# Patient Record
Sex: Male | Born: 1935 | Race: White | Hispanic: No | Smoking: Never smoker
Health system: Southern US, Community
[De-identification: ages and names within clinical notes are randomized; demographics above are authoritative.]

## PROBLEM LIST (undated history)

## (undated) DIAGNOSIS — I4891 Unspecified atrial fibrillation: Secondary | ICD-10-CM

## (undated) DIAGNOSIS — H409 Unspecified glaucoma: Secondary | ICD-10-CM

## (undated) DIAGNOSIS — E119 Type 2 diabetes mellitus without complications: Secondary | ICD-10-CM

## (undated) DIAGNOSIS — Z9181 History of falling: Secondary | ICD-10-CM

## (undated) DIAGNOSIS — H269 Unspecified cataract: Secondary | ICD-10-CM

## (undated) DIAGNOSIS — G629 Polyneuropathy, unspecified: Secondary | ICD-10-CM

## (undated) DIAGNOSIS — E785 Hyperlipidemia, unspecified: Secondary | ICD-10-CM

## (undated) DIAGNOSIS — E1142 Type 2 diabetes mellitus with diabetic polyneuropathy: Secondary | ICD-10-CM

## (undated) DIAGNOSIS — C61 Malignant neoplasm of prostate: Secondary | ICD-10-CM

## (undated) DIAGNOSIS — Z923 Personal history of irradiation: Secondary | ICD-10-CM

## (undated) HISTORY — DX: Type 2 diabetes mellitus with diabetic polyneuropathy: E11.42

## (undated) HISTORY — DX: Personal history of irradiation: Z92.3

## (undated) HISTORY — DX: Hyperlipidemia, unspecified: E78.5

## (undated) HISTORY — DX: History of falling: Z91.81

## (undated) HISTORY — DX: Malignant neoplasm of prostate: C61

---

## 2001-02-17 DIAGNOSIS — I4891 Unspecified atrial fibrillation: Secondary | ICD-10-CM

## 2001-02-17 HISTORY — DX: Unspecified atrial fibrillation: I48.91

## 2006-07-14 ENCOUNTER — Ambulatory Visit: Payer: Self-pay | Admitting: *Deleted

## 2011-02-21 DIAGNOSIS — I4891 Unspecified atrial fibrillation: Secondary | ICD-10-CM | POA: Diagnosis not present

## 2011-02-21 DIAGNOSIS — Z7901 Long term (current) use of anticoagulants: Secondary | ICD-10-CM | POA: Diagnosis not present

## 2011-03-11 DIAGNOSIS — E1049 Type 1 diabetes mellitus with other diabetic neurological complication: Secondary | ICD-10-CM | POA: Diagnosis not present

## 2011-03-24 DIAGNOSIS — Z7901 Long term (current) use of anticoagulants: Secondary | ICD-10-CM | POA: Diagnosis not present

## 2011-03-24 DIAGNOSIS — I4891 Unspecified atrial fibrillation: Secondary | ICD-10-CM | POA: Diagnosis not present

## 2011-04-21 DIAGNOSIS — I4891 Unspecified atrial fibrillation: Secondary | ICD-10-CM | POA: Diagnosis not present

## 2011-04-21 DIAGNOSIS — Z7901 Long term (current) use of anticoagulants: Secondary | ICD-10-CM | POA: Diagnosis not present

## 2011-04-23 DIAGNOSIS — H409 Unspecified glaucoma: Secondary | ICD-10-CM | POA: Diagnosis not present

## 2011-04-23 DIAGNOSIS — H2510 Age-related nuclear cataract, unspecified eye: Secondary | ICD-10-CM | POA: Diagnosis not present

## 2011-04-23 DIAGNOSIS — H4011X0 Primary open-angle glaucoma, stage unspecified: Secondary | ICD-10-CM | POA: Diagnosis not present

## 2011-05-13 DIAGNOSIS — Z125 Encounter for screening for malignant neoplasm of prostate: Secondary | ICD-10-CM | POA: Diagnosis not present

## 2011-05-13 DIAGNOSIS — E1129 Type 2 diabetes mellitus with other diabetic kidney complication: Secondary | ICD-10-CM | POA: Diagnosis not present

## 2011-05-13 DIAGNOSIS — E785 Hyperlipidemia, unspecified: Secondary | ICD-10-CM | POA: Diagnosis not present

## 2011-05-13 DIAGNOSIS — I10 Essential (primary) hypertension: Secondary | ICD-10-CM | POA: Diagnosis not present

## 2011-05-14 DIAGNOSIS — E785 Hyperlipidemia, unspecified: Secondary | ICD-10-CM | POA: Diagnosis not present

## 2011-05-20 DIAGNOSIS — E1165 Type 2 diabetes mellitus with hyperglycemia: Secondary | ICD-10-CM | POA: Diagnosis not present

## 2011-05-20 DIAGNOSIS — E1129 Type 2 diabetes mellitus with other diabetic kidney complication: Secondary | ICD-10-CM | POA: Diagnosis not present

## 2011-05-20 DIAGNOSIS — Z125 Encounter for screening for malignant neoplasm of prostate: Secondary | ICD-10-CM | POA: Diagnosis not present

## 2011-05-20 DIAGNOSIS — I10 Essential (primary) hypertension: Secondary | ICD-10-CM | POA: Diagnosis not present

## 2011-05-20 DIAGNOSIS — E785 Hyperlipidemia, unspecified: Secondary | ICD-10-CM | POA: Diagnosis not present

## 2011-05-20 DIAGNOSIS — Z7901 Long term (current) use of anticoagulants: Secondary | ICD-10-CM | POA: Diagnosis not present

## 2011-05-20 DIAGNOSIS — Z0000 Encounter for general adult medical examination without abnormal findings: Secondary | ICD-10-CM | POA: Diagnosis not present

## 2011-05-22 DIAGNOSIS — I739 Peripheral vascular disease, unspecified: Secondary | ICD-10-CM | POA: Diagnosis not present

## 2011-05-22 DIAGNOSIS — E1059 Type 1 diabetes mellitus with other circulatory complications: Secondary | ICD-10-CM | POA: Diagnosis not present

## 2011-05-22 DIAGNOSIS — Z1212 Encounter for screening for malignant neoplasm of rectum: Secondary | ICD-10-CM | POA: Diagnosis not present

## 2011-05-22 DIAGNOSIS — L608 Other nail disorders: Secondary | ICD-10-CM | POA: Diagnosis not present

## 2011-05-27 DIAGNOSIS — Z7901 Long term (current) use of anticoagulants: Secondary | ICD-10-CM | POA: Diagnosis not present

## 2011-05-27 DIAGNOSIS — I4891 Unspecified atrial fibrillation: Secondary | ICD-10-CM | POA: Diagnosis not present

## 2011-06-10 DIAGNOSIS — Z7901 Long term (current) use of anticoagulants: Secondary | ICD-10-CM | POA: Diagnosis not present

## 2011-06-10 DIAGNOSIS — I4891 Unspecified atrial fibrillation: Secondary | ICD-10-CM | POA: Diagnosis not present

## 2011-06-17 DIAGNOSIS — Z7901 Long term (current) use of anticoagulants: Secondary | ICD-10-CM | POA: Diagnosis not present

## 2011-06-17 DIAGNOSIS — I4891 Unspecified atrial fibrillation: Secondary | ICD-10-CM | POA: Diagnosis not present

## 2011-07-22 DIAGNOSIS — N529 Male erectile dysfunction, unspecified: Secondary | ICD-10-CM | POA: Diagnosis not present

## 2011-07-22 DIAGNOSIS — Z125 Encounter for screening for malignant neoplasm of prostate: Secondary | ICD-10-CM | POA: Diagnosis not present

## 2011-07-22 DIAGNOSIS — N400 Enlarged prostate without lower urinary tract symptoms: Secondary | ICD-10-CM | POA: Diagnosis not present

## 2011-07-24 DIAGNOSIS — Z7901 Long term (current) use of anticoagulants: Secondary | ICD-10-CM | POA: Diagnosis not present

## 2011-07-24 DIAGNOSIS — I4891 Unspecified atrial fibrillation: Secondary | ICD-10-CM | POA: Diagnosis not present

## 2011-07-31 DIAGNOSIS — L84 Corns and callosities: Secondary | ICD-10-CM | POA: Diagnosis not present

## 2011-07-31 DIAGNOSIS — I739 Peripheral vascular disease, unspecified: Secondary | ICD-10-CM | POA: Diagnosis not present

## 2011-07-31 DIAGNOSIS — L608 Other nail disorders: Secondary | ICD-10-CM | POA: Diagnosis not present

## 2011-07-31 DIAGNOSIS — E1059 Type 1 diabetes mellitus with other circulatory complications: Secondary | ICD-10-CM | POA: Diagnosis not present

## 2011-08-25 DIAGNOSIS — R972 Elevated prostate specific antigen [PSA]: Secondary | ICD-10-CM | POA: Diagnosis not present

## 2011-08-27 DIAGNOSIS — I4891 Unspecified atrial fibrillation: Secondary | ICD-10-CM | POA: Diagnosis not present

## 2011-08-27 DIAGNOSIS — Z7901 Long term (current) use of anticoagulants: Secondary | ICD-10-CM | POA: Diagnosis not present

## 2011-09-25 DIAGNOSIS — I4891 Unspecified atrial fibrillation: Secondary | ICD-10-CM | POA: Diagnosis not present

## 2011-09-25 DIAGNOSIS — Z7901 Long term (current) use of anticoagulants: Secondary | ICD-10-CM | POA: Diagnosis not present

## 2011-10-09 DIAGNOSIS — I4891 Unspecified atrial fibrillation: Secondary | ICD-10-CM | POA: Diagnosis not present

## 2011-10-09 DIAGNOSIS — I739 Peripheral vascular disease, unspecified: Secondary | ICD-10-CM | POA: Diagnosis not present

## 2011-10-09 DIAGNOSIS — L608 Other nail disorders: Secondary | ICD-10-CM | POA: Diagnosis not present

## 2011-10-09 DIAGNOSIS — E1059 Type 1 diabetes mellitus with other circulatory complications: Secondary | ICD-10-CM | POA: Diagnosis not present

## 2011-10-09 DIAGNOSIS — Z7901 Long term (current) use of anticoagulants: Secondary | ICD-10-CM | POA: Diagnosis not present

## 2011-11-05 DIAGNOSIS — H409 Unspecified glaucoma: Secondary | ICD-10-CM | POA: Diagnosis not present

## 2011-11-05 DIAGNOSIS — H4011X0 Primary open-angle glaucoma, stage unspecified: Secondary | ICD-10-CM | POA: Diagnosis not present

## 2011-11-05 DIAGNOSIS — H2510 Age-related nuclear cataract, unspecified eye: Secondary | ICD-10-CM | POA: Diagnosis not present

## 2011-11-17 DIAGNOSIS — R972 Elevated prostate specific antigen [PSA]: Secondary | ICD-10-CM | POA: Diagnosis not present

## 2011-11-24 DIAGNOSIS — R972 Elevated prostate specific antigen [PSA]: Secondary | ICD-10-CM | POA: Diagnosis not present

## 2011-11-25 DIAGNOSIS — E11329 Type 2 diabetes mellitus with mild nonproliferative diabetic retinopathy without macular edema: Secondary | ICD-10-CM | POA: Diagnosis not present

## 2011-11-25 DIAGNOSIS — H43819 Vitreous degeneration, unspecified eye: Secondary | ICD-10-CM | POA: Diagnosis not present

## 2011-11-25 DIAGNOSIS — E1039 Type 1 diabetes mellitus with other diabetic ophthalmic complication: Secondary | ICD-10-CM | POA: Diagnosis not present

## 2011-11-25 DIAGNOSIS — H4011X0 Primary open-angle glaucoma, stage unspecified: Secondary | ICD-10-CM | POA: Diagnosis not present

## 2011-11-28 DIAGNOSIS — Z7901 Long term (current) use of anticoagulants: Secondary | ICD-10-CM | POA: Diagnosis not present

## 2011-11-28 DIAGNOSIS — R972 Elevated prostate specific antigen [PSA]: Secondary | ICD-10-CM | POA: Diagnosis not present

## 2011-11-28 DIAGNOSIS — Z23 Encounter for immunization: Secondary | ICD-10-CM | POA: Diagnosis not present

## 2011-11-28 DIAGNOSIS — I4891 Unspecified atrial fibrillation: Secondary | ICD-10-CM | POA: Diagnosis not present

## 2011-12-25 DIAGNOSIS — R972 Elevated prostate specific antigen [PSA]: Secondary | ICD-10-CM | POA: Diagnosis not present

## 2011-12-25 HISTORY — PX: PROSTATE BIOPSY: SHX241

## 2011-12-30 DIAGNOSIS — Z7901 Long term (current) use of anticoagulants: Secondary | ICD-10-CM | POA: Diagnosis not present

## 2011-12-30 DIAGNOSIS — I4891 Unspecified atrial fibrillation: Secondary | ICD-10-CM | POA: Diagnosis not present

## 2012-01-01 DIAGNOSIS — E1059 Type 1 diabetes mellitus with other circulatory complications: Secondary | ICD-10-CM | POA: Diagnosis not present

## 2012-01-01 DIAGNOSIS — L608 Other nail disorders: Secondary | ICD-10-CM | POA: Diagnosis not present

## 2012-01-01 DIAGNOSIS — I739 Peripheral vascular disease, unspecified: Secondary | ICD-10-CM | POA: Diagnosis not present

## 2012-01-12 DIAGNOSIS — Z7901 Long term (current) use of anticoagulants: Secondary | ICD-10-CM | POA: Diagnosis not present

## 2012-01-12 DIAGNOSIS — I4891 Unspecified atrial fibrillation: Secondary | ICD-10-CM | POA: Diagnosis not present

## 2012-01-19 DIAGNOSIS — C61 Malignant neoplasm of prostate: Secondary | ICD-10-CM | POA: Diagnosis not present

## 2012-01-28 NOTE — Progress Notes (Signed)
New Consult Prostate Cancer 12/25/2011 Biopsy=Adenocarcinoma,Gleason=3+3=6,PSA=5.57,Volume=45cc PSA 11/17/11=5.57 08/01/11=5.69 07/2010=4.65 02/2010=5.5 01/2008=3.1  Widowed,Retired,     Allergies:NKDA

## 2012-01-29 ENCOUNTER — Ambulatory Visit
Admission: RE | Admit: 2012-01-29 | Discharge: 2012-01-29 | Disposition: A | Discharge status: Home / Self Care | Payer: Medicare Other | Source: Ambulatory Visit | Attending: Radiation Oncology | Admitting: Radiation Oncology

## 2012-01-29 ENCOUNTER — Encounter: Payer: Self-pay | Admitting: Radiation Oncology

## 2012-01-29 VITALS — BP 172/91 | HR 112 | Temp 98.0°F | Resp 20 | Ht 73.0 in | Wt 251.1 lb

## 2012-01-29 DIAGNOSIS — C61 Malignant neoplasm of prostate: Secondary | ICD-10-CM

## 2012-01-29 DIAGNOSIS — Z79899 Other long term (current) drug therapy: Secondary | ICD-10-CM | POA: Insufficient documentation

## 2012-01-29 DIAGNOSIS — Z7901 Long term (current) use of anticoagulants: Secondary | ICD-10-CM | POA: Diagnosis not present

## 2012-01-29 DIAGNOSIS — E1149 Type 2 diabetes mellitus with other diabetic neurological complication: Secondary | ICD-10-CM | POA: Diagnosis not present

## 2012-01-29 DIAGNOSIS — Z794 Long term (current) use of insulin: Secondary | ICD-10-CM | POA: Insufficient documentation

## 2012-01-29 DIAGNOSIS — N529 Male erectile dysfunction, unspecified: Secondary | ICD-10-CM | POA: Insufficient documentation

## 2012-01-29 DIAGNOSIS — E1142 Type 2 diabetes mellitus with diabetic polyneuropathy: Secondary | ICD-10-CM | POA: Insufficient documentation

## 2012-01-29 HISTORY — DX: Unspecified cataract: H26.9

## 2012-01-29 HISTORY — DX: Unspecified glaucoma: H40.9

## 2012-01-29 HISTORY — DX: Type 2 diabetes mellitus without complications: E11.9

## 2012-01-29 NOTE — Progress Notes (Signed)
[facility] Radiation Oncology NEW PATIENT EVALUATION  Name: Charles Friedman MRN: [id number]  Date:   01/29/2012           DOB: 06-06-1932  Status: outpatient   CC:   Dr. Arther Dames, * Dr. Guerry Bruin   REFERRING PHYSICIAN: Arther Dames, *MD   DIAGNOSIS:  Stage TI C. favorable risk adenocarcinoma prostate  HISTORY OF PRESENT ILLNESS:  Charles Friedman is a 78 y.o. male who is seen today for the courtesy of Dr. Margarita Grizzle for evaluation of his stage TI C. favorable risk adenocarcinoma prostate. He was noted to have a slow rise in his PSA from 3.1 in December 2009 up to 5.5 by January 2012, more recently 5.6 on 11/17/2011 . He underwent ultrasound-guided biopsies on 12/25/2011 by Dr. Margarita Grizzle. His then had Gleason 6 (3+3) involving 50% of one core from right lateral mid gland, 50% of one core from the right mid gland, 10% of one core from the right apex and 70% of one core from the left mid gland. 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 His gland volume was measured to be 45 cc. He is doing well from a GU and GI standpoint. His I PSS score is 9. He does have a peripheral Jaqualyn Juday E. from diabetes. He has erectile dysfunction.  PREVIOUS RADIATION THERAPY: No   PAST MEDICAL HISTORY:  has a past medical history of Cataract; Diabetes mellitus without complication; and Glaucoma.     PAST SURGICAL HISTORY:  Past Surgical History  Procedure Date  . Prostate biopsy 12/25/11    Adenocarcinoam     FAMILY HISTORY: family history includes Cancer (age of onset:43) in his sister. His father died of cardiac disease at 38 and his mother died of cardiac disease and 27.   SOCIAL HISTORY:  reports that he has never smoked. He does not have any smokeless tobacco history on file. He reports that he does not drink alcohol or use illicit drugs. 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 Widowed for 4 years, 4  children.   ALLERGIES: Review of patient's allergies indicates no known allergies.   MEDICATIONS:  Current Outpatient Prescriptions  Medication Sig Dispense Refill  . diltiazem (TIAZAC) 300 MG 24 hr capsule Take 300 mg by mouth daily.      . fish oil-omega-3 fatty acids 1000 MG capsule Take 2 g by mouth daily.      . fluvastatin XL (LESCOL XL) 80 MG 24 hr tablet Take 80 mg by mouth daily.      Marland Kitchen glyBURIDE-metformin (GLUCOVANCE) 2.5-500 MG per tablet Take 1 tablet by mouth 2 (two) times daily with a meal.       . hydrochlorothiazide (HYDRODIURIL) 50 MG tablet Take 25 mg by mouth daily.       . insulin glargine (LANTUS) 100 UNIT/ML injection Inject 100 Units into the skin at bedtime.      . insulin NPH (HUMULIN N,NOVOLIN N) 100 UNIT/ML injection Inject into the skin. If needed after checking blood sugar,sliding scale      . latanoprost (XALATAN) 0.005 % ophthalmic solution Place 1 drop into both eyes at bedtime.       . Multiple Vitamin (MULTIVITAMINS PO) Take 1 tablet by mouth.      . valsartan (DIOVAN) 80 MG tablet Take 80 mg by mouth daily.      . vardenafil (LEVITRA) 20 MG tablet Take 20 mg by mouth  daily as needed.      . vitamin B-12 (CYANOCOBALAMIN) 100 MCG tablet Take 50 mcg by mouth daily.      . VITAMIN D, CHOLECALCIFEROL, PO Take 400 Units by mouth daily.       . vitamin E (VITAMIN E) 400 UNIT capsule Take 400 Units by mouth daily.      Marland Kitchen warfarin (COUMADIN) 5 MG tablet Take 5 mg by mouth daily. Hx A-Fib, per coumadin pharmacy protocol         REVIEW OF SYSTEMS:  Pertinent items are noted in HPI.    PHYSICAL EXAM:  height is 6\' 1"  (1.854 m) and weight is 251 lb 1.6 oz (113.898 kg). His oral temperature is 98 F (36.7 C). His blood pressure is 172/91 and his pulse is 112. His respiration is 20 and oxygen saturation is 98%.   Head and neck examination: Grossly unremarkable. Nodes: Without palpable cervical or supraclavicular lymphadenopathy. Chest: Lungs clear. Heart:  Irregularly irregular rhythm. Back: Without spinal or CVA tenderness abdomen: Without masses organomegaly. Genitalia: Unremarkable to inspection. Rectal: The prostate gland is normal size and is without focal induration or nodularity. Extremities: Trace to 1+ ankle edema.    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 I explained to Mr. Salomon that his prognosis is related to his stage, Gleason score, and PSA level. All are favorable. Other prognostic factors include PSA doubling time and disease volume. Management options include active/close surveillance versus radiation therapy. Radiation therapy options include seed implantation alone or external beam/IMRT. After lengthy discussion he is most interested in curative therapy and does not want to consider active/close surveillance. He tells me he wants the least toxic treatment, and this would be external beam/IMRT since seed implantation would probably cause more urinary symptoms at least temporarily. We discussed the potential acute and late toxicities of external beam/IMRT. His prognosis is quite favorable with an expected cure rate exceeding 90%.   PLAN:  As discussed above. I will kindly ask Dr. Margarita Grizzle to placed 3 gold seed markers for prostate localization/image guidance during his IMRT. He'll need to come off his Coumadin just like he did before his prostate biopsies. After placement of 3 gold seeds he'll be scheduled for CT simulation.  I spent 60 minutes minutes face to face with the patient and more than 50% of that time was spent in counseling and/or coordination of care.

## 2012-01-29 NOTE — Progress Notes (Signed)
Please see the Nurse Progress Note in the MD Initial Consult Encounter for this patient. 

## 2012-01-29 NOTE — Progress Notes (Signed)
Patient arrived with slow steady gait, alert,oriented x3, 4 children No dysuria,sometimes has urgency  To void,regular bowel movements no c/o pain   No Pacemaker No Radiation therapy

## 2012-01-30 ENCOUNTER — Telehealth: Payer: Self-pay | Admitting: *Deleted

## 2012-01-30 NOTE — Telephone Encounter (Signed)
CALLED PATIENT TO INFORM OF GOLD SEED PLACEMENT FOR 02-13-12 AT 9:30 AM (ARRIVAL TIME ) AT DR. Hilario Quarry OFFICE AND HIS SIM ON 02-16-12 AT 9:00 AM AT DR. MURRAY'S OFFICE, LVM FOR A RETURN CALL.

## 2012-02-04 DIAGNOSIS — I4891 Unspecified atrial fibrillation: Secondary | ICD-10-CM | POA: Diagnosis not present

## 2012-02-04 DIAGNOSIS — Z7901 Long term (current) use of anticoagulants: Secondary | ICD-10-CM | POA: Diagnosis not present

## 2012-02-13 ENCOUNTER — Telehealth: Payer: Self-pay | Admitting: *Deleted

## 2012-02-13 DIAGNOSIS — IMO0002 Reserved for concepts with insufficient information to code with codable children: Secondary | ICD-10-CM | POA: Diagnosis not present

## 2012-02-13 DIAGNOSIS — C61 Malignant neoplasm of prostate: Secondary | ICD-10-CM | POA: Diagnosis not present

## 2012-02-13 NOTE — Telephone Encounter (Signed)
CALLED PATIENT TO REMIND OF 9:00 AM SIM ON 02-16-12, CONFIRMED APPT. WITH PATIENT.

## 2012-02-16 ENCOUNTER — Ambulatory Visit: Admission: RE | Admit: 2012-02-16 | Payer: Medicare Other | Source: Ambulatory Visit | Admitting: Radiation Oncology

## 2012-02-16 ENCOUNTER — Encounter: Payer: Self-pay | Admitting: Radiation Oncology

## 2012-02-16 NOTE — Progress Notes (Signed)
Chart note: Charles Friedman visits today for his CT simulation in the management of his stage TI C. favorable risk adenocarcinoma prostate. A during his consultation visit, he wanted to proceed with a curative course of treatment even though we discussed close surveillance in detail. He tells me this past week he was offered a job to help with a [place] that may take 6 months to a year to complete. He will know in 2 weeks if he gets the job. If he does not he would like to return for his radiation therapy, but if he gets the job he would like to proceed with close surveillance until late this year. I told that he would need to keep up with Dr. Margarita Grizzle to monitor his PSA. I would be okay treating him no later than one year from now without repeating his biopsies. He'll call me in 2 weeks.

## 2012-03-16 DIAGNOSIS — Z7901 Long term (current) use of anticoagulants: Secondary | ICD-10-CM | POA: Diagnosis not present

## 2012-03-16 DIAGNOSIS — I4891 Unspecified atrial fibrillation: Secondary | ICD-10-CM | POA: Diagnosis not present

## 2012-03-29 ENCOUNTER — Encounter: Payer: Self-pay | Admitting: Radiation Oncology

## 2012-03-29 DIAGNOSIS — I739 Peripheral vascular disease, unspecified: Secondary | ICD-10-CM | POA: Diagnosis not present

## 2012-03-29 DIAGNOSIS — L608 Other nail disorders: Secondary | ICD-10-CM | POA: Diagnosis not present

## 2012-03-29 DIAGNOSIS — E1059 Type 1 diabetes mellitus with other circulatory complications: Secondary | ICD-10-CM | POA: Diagnosis not present

## 2012-03-29 NOTE — Progress Notes (Addendum)
Chart note: I spoke with Charles Friedman this afternoon. He wants to postpone radiation therapy, having recently taken on a new job . I emphasized the need for him to keep up with Dr. Jerlyn Ly for followup PSA determination visits. I am more than happy to see him for a followup visit in the future.

## 2012-05-12 DIAGNOSIS — Z7901 Long term (current) use of anticoagulants: Secondary | ICD-10-CM | POA: Diagnosis not present

## 2012-05-12 DIAGNOSIS — I4891 Unspecified atrial fibrillation: Secondary | ICD-10-CM | POA: Diagnosis not present

## 2012-05-26 DIAGNOSIS — Z7901 Long term (current) use of anticoagulants: Secondary | ICD-10-CM | POA: Diagnosis not present

## 2012-05-26 DIAGNOSIS — I4891 Unspecified atrial fibrillation: Secondary | ICD-10-CM | POA: Diagnosis not present

## 2012-06-02 DIAGNOSIS — E1165 Type 2 diabetes mellitus with hyperglycemia: Secondary | ICD-10-CM | POA: Diagnosis not present

## 2012-06-02 DIAGNOSIS — E1129 Type 2 diabetes mellitus with other diabetic kidney complication: Secondary | ICD-10-CM | POA: Diagnosis not present

## 2012-06-02 DIAGNOSIS — R972 Elevated prostate specific antigen [PSA]: Secondary | ICD-10-CM | POA: Diagnosis not present

## 2012-06-02 DIAGNOSIS — I10 Essential (primary) hypertension: Secondary | ICD-10-CM | POA: Diagnosis not present

## 2012-06-02 DIAGNOSIS — E785 Hyperlipidemia, unspecified: Secondary | ICD-10-CM | POA: Diagnosis not present

## 2012-06-02 DIAGNOSIS — R82998 Other abnormal findings in urine: Secondary | ICD-10-CM | POA: Diagnosis not present

## 2012-06-07 DIAGNOSIS — L608 Other nail disorders: Secondary | ICD-10-CM | POA: Diagnosis not present

## 2012-06-07 DIAGNOSIS — I739 Peripheral vascular disease, unspecified: Secondary | ICD-10-CM | POA: Diagnosis not present

## 2012-06-07 DIAGNOSIS — E1059 Type 1 diabetes mellitus with other circulatory complications: Secondary | ICD-10-CM | POA: Diagnosis not present

## 2012-06-09 ENCOUNTER — Other Ambulatory Visit: Payer: Self-pay | Admitting: Internal Medicine

## 2012-06-09 DIAGNOSIS — E785 Hyperlipidemia, unspecified: Secondary | ICD-10-CM | POA: Diagnosis not present

## 2012-06-09 DIAGNOSIS — R972 Elevated prostate specific antigen [PSA]: Secondary | ICD-10-CM | POA: Diagnosis not present

## 2012-06-09 DIAGNOSIS — Z0000 Encounter for general adult medical examination without abnormal findings: Secondary | ICD-10-CM | POA: Diagnosis not present

## 2012-06-09 DIAGNOSIS — R809 Proteinuria, unspecified: Secondary | ICD-10-CM | POA: Diagnosis not present

## 2012-06-09 DIAGNOSIS — Z1331 Encounter for screening for depression: Secondary | ICD-10-CM | POA: Diagnosis not present

## 2012-06-09 DIAGNOSIS — E1129 Type 2 diabetes mellitus with other diabetic kidney complication: Secondary | ICD-10-CM | POA: Diagnosis not present

## 2012-06-09 DIAGNOSIS — R1310 Dysphagia, unspecified: Secondary | ICD-10-CM

## 2012-06-09 DIAGNOSIS — I4891 Unspecified atrial fibrillation: Secondary | ICD-10-CM | POA: Diagnosis not present

## 2012-06-09 DIAGNOSIS — E669 Obesity, unspecified: Secondary | ICD-10-CM | POA: Diagnosis not present

## 2012-06-09 DIAGNOSIS — I10 Essential (primary) hypertension: Secondary | ICD-10-CM | POA: Diagnosis not present

## 2012-06-11 ENCOUNTER — Ambulatory Visit
Admission: RE | Admit: 2012-06-11 | Discharge: 2012-06-11 | Disposition: A | Discharge status: Home / Self Care | Payer: Medicare Other | Source: Ambulatory Visit | Attending: Internal Medicine | Admitting: Internal Medicine

## 2012-06-11 DIAGNOSIS — H4011X0 Primary open-angle glaucoma, stage unspecified: Secondary | ICD-10-CM | POA: Diagnosis not present

## 2012-06-11 DIAGNOSIS — H409 Unspecified glaucoma: Secondary | ICD-10-CM | POA: Diagnosis not present

## 2012-06-11 DIAGNOSIS — R1310 Dysphagia, unspecified: Secondary | ICD-10-CM

## 2012-06-11 DIAGNOSIS — Z7901 Long term (current) use of anticoagulants: Secondary | ICD-10-CM | POA: Diagnosis not present

## 2012-06-11 DIAGNOSIS — N390 Urinary tract infection, site not specified: Secondary | ICD-10-CM | POA: Diagnosis not present

## 2012-06-11 DIAGNOSIS — I4891 Unspecified atrial fibrillation: Secondary | ICD-10-CM | POA: Diagnosis not present

## 2012-06-11 DIAGNOSIS — K219 Gastro-esophageal reflux disease without esophagitis: Secondary | ICD-10-CM | POA: Diagnosis not present

## 2012-06-11 DIAGNOSIS — H2510 Age-related nuclear cataract, unspecified eye: Secondary | ICD-10-CM | POA: Diagnosis not present

## 2012-06-11 DIAGNOSIS — K449 Diaphragmatic hernia without obstruction or gangrene: Secondary | ICD-10-CM | POA: Diagnosis not present

## 2012-08-17 DIAGNOSIS — E1059 Type 1 diabetes mellitus with other circulatory complications: Secondary | ICD-10-CM | POA: Diagnosis not present

## 2012-08-17 DIAGNOSIS — I739 Peripheral vascular disease, unspecified: Secondary | ICD-10-CM | POA: Diagnosis not present

## 2012-08-17 DIAGNOSIS — L608 Other nail disorders: Secondary | ICD-10-CM | POA: Diagnosis not present

## 2012-09-15 DIAGNOSIS — H47239 Glaucomatous optic atrophy, unspecified eye: Secondary | ICD-10-CM | POA: Diagnosis not present

## 2012-09-15 DIAGNOSIS — H409 Unspecified glaucoma: Secondary | ICD-10-CM | POA: Diagnosis not present

## 2012-09-15 DIAGNOSIS — H2510 Age-related nuclear cataract, unspecified eye: Secondary | ICD-10-CM | POA: Diagnosis not present

## 2012-09-15 DIAGNOSIS — H4011X0 Primary open-angle glaucoma, stage unspecified: Secondary | ICD-10-CM | POA: Diagnosis not present

## 2012-09-24 DIAGNOSIS — Z7901 Long term (current) use of anticoagulants: Secondary | ICD-10-CM | POA: Diagnosis not present

## 2012-09-24 DIAGNOSIS — I4891 Unspecified atrial fibrillation: Secondary | ICD-10-CM | POA: Diagnosis not present

## 2012-10-13 DIAGNOSIS — N182 Chronic kidney disease, stage 2 (mild): Secondary | ICD-10-CM | POA: Diagnosis not present

## 2012-10-13 DIAGNOSIS — I10 Essential (primary) hypertension: Secondary | ICD-10-CM | POA: Diagnosis not present

## 2012-10-13 DIAGNOSIS — E1129 Type 2 diabetes mellitus with other diabetic kidney complication: Secondary | ICD-10-CM | POA: Diagnosis not present

## 2012-10-13 DIAGNOSIS — Z7901 Long term (current) use of anticoagulants: Secondary | ICD-10-CM | POA: Diagnosis not present

## 2012-10-13 DIAGNOSIS — E785 Hyperlipidemia, unspecified: Secondary | ICD-10-CM | POA: Diagnosis not present

## 2012-10-13 DIAGNOSIS — I4891 Unspecified atrial fibrillation: Secondary | ICD-10-CM | POA: Diagnosis not present

## 2012-10-13 DIAGNOSIS — E669 Obesity, unspecified: Secondary | ICD-10-CM | POA: Diagnosis not present

## 2012-10-13 DIAGNOSIS — R809 Proteinuria, unspecified: Secondary | ICD-10-CM | POA: Diagnosis not present

## 2012-12-06 DIAGNOSIS — L608 Other nail disorders: Secondary | ICD-10-CM | POA: Diagnosis not present

## 2012-12-06 DIAGNOSIS — I739 Peripheral vascular disease, unspecified: Secondary | ICD-10-CM | POA: Diagnosis not present

## 2012-12-06 DIAGNOSIS — E1059 Type 1 diabetes mellitus with other circulatory complications: Secondary | ICD-10-CM | POA: Diagnosis not present

## 2012-12-07 DIAGNOSIS — I4891 Unspecified atrial fibrillation: Secondary | ICD-10-CM | POA: Diagnosis not present

## 2012-12-07 DIAGNOSIS — Z7901 Long term (current) use of anticoagulants: Secondary | ICD-10-CM | POA: Diagnosis not present

## 2013-01-06 DIAGNOSIS — I4891 Unspecified atrial fibrillation: Secondary | ICD-10-CM | POA: Diagnosis not present

## 2013-01-06 DIAGNOSIS — Z7901 Long term (current) use of anticoagulants: Secondary | ICD-10-CM | POA: Diagnosis not present

## 2013-01-27 DIAGNOSIS — E785 Hyperlipidemia, unspecified: Secondary | ICD-10-CM | POA: Diagnosis not present

## 2013-01-27 DIAGNOSIS — Z23 Encounter for immunization: Secondary | ICD-10-CM | POA: Diagnosis not present

## 2013-01-27 DIAGNOSIS — E669 Obesity, unspecified: Secondary | ICD-10-CM | POA: Diagnosis not present

## 2013-01-27 DIAGNOSIS — R809 Proteinuria, unspecified: Secondary | ICD-10-CM | POA: Diagnosis not present

## 2013-01-27 DIAGNOSIS — I4891 Unspecified atrial fibrillation: Secondary | ICD-10-CM | POA: Diagnosis not present

## 2013-01-27 DIAGNOSIS — Z7901 Long term (current) use of anticoagulants: Secondary | ICD-10-CM | POA: Diagnosis not present

## 2013-01-27 DIAGNOSIS — E1129 Type 2 diabetes mellitus with other diabetic kidney complication: Secondary | ICD-10-CM | POA: Diagnosis not present

## 2013-01-27 DIAGNOSIS — R972 Elevated prostate specific antigen [PSA]: Secondary | ICD-10-CM | POA: Diagnosis not present

## 2013-01-27 DIAGNOSIS — H409 Unspecified glaucoma: Secondary | ICD-10-CM | POA: Diagnosis not present

## 2013-01-27 DIAGNOSIS — N529 Male erectile dysfunction, unspecified: Secondary | ICD-10-CM | POA: Diagnosis not present

## 2013-02-15 DIAGNOSIS — H2510 Age-related nuclear cataract, unspecified eye: Secondary | ICD-10-CM | POA: Diagnosis not present

## 2013-02-15 DIAGNOSIS — Z7901 Long term (current) use of anticoagulants: Secondary | ICD-10-CM | POA: Diagnosis not present

## 2013-02-15 DIAGNOSIS — I4891 Unspecified atrial fibrillation: Secondary | ICD-10-CM | POA: Diagnosis not present

## 2013-02-15 DIAGNOSIS — H47239 Glaucomatous optic atrophy, unspecified eye: Secondary | ICD-10-CM | POA: Diagnosis not present

## 2013-02-15 DIAGNOSIS — H409 Unspecified glaucoma: Secondary | ICD-10-CM | POA: Diagnosis not present

## 2013-02-28 DIAGNOSIS — E1059 Type 1 diabetes mellitus with other circulatory complications: Secondary | ICD-10-CM | POA: Diagnosis not present

## 2013-02-28 DIAGNOSIS — L608 Other nail disorders: Secondary | ICD-10-CM | POA: Diagnosis not present

## 2013-02-28 DIAGNOSIS — I739 Peripheral vascular disease, unspecified: Secondary | ICD-10-CM | POA: Diagnosis not present

## 2013-03-08 DIAGNOSIS — I4891 Unspecified atrial fibrillation: Secondary | ICD-10-CM | POA: Diagnosis not present

## 2013-03-08 DIAGNOSIS — Z7901 Long term (current) use of anticoagulants: Secondary | ICD-10-CM | POA: Diagnosis not present

## 2013-03-29 DIAGNOSIS — Z6831 Body mass index (BMI) 31.0-31.9, adult: Secondary | ICD-10-CM | POA: Diagnosis not present

## 2013-03-29 DIAGNOSIS — I4891 Unspecified atrial fibrillation: Secondary | ICD-10-CM | POA: Diagnosis not present

## 2013-03-29 DIAGNOSIS — Z7901 Long term (current) use of anticoagulants: Secondary | ICD-10-CM | POA: Diagnosis not present

## 2013-04-26 DIAGNOSIS — Z7901 Long term (current) use of anticoagulants: Secondary | ICD-10-CM | POA: Diagnosis not present

## 2013-04-26 DIAGNOSIS — I4891 Unspecified atrial fibrillation: Secondary | ICD-10-CM | POA: Diagnosis not present

## 2013-04-27 DIAGNOSIS — E1129 Type 2 diabetes mellitus with other diabetic kidney complication: Secondary | ICD-10-CM | POA: Diagnosis not present

## 2013-04-27 DIAGNOSIS — K449 Diaphragmatic hernia without obstruction or gangrene: Secondary | ICD-10-CM | POA: Diagnosis not present

## 2013-04-27 DIAGNOSIS — I4891 Unspecified atrial fibrillation: Secondary | ICD-10-CM | POA: Diagnosis not present

## 2013-04-27 DIAGNOSIS — E1165 Type 2 diabetes mellitus with hyperglycemia: Secondary | ICD-10-CM | POA: Diagnosis not present

## 2013-04-27 DIAGNOSIS — N182 Chronic kidney disease, stage 2 (mild): Secondary | ICD-10-CM | POA: Diagnosis not present

## 2013-04-27 DIAGNOSIS — H409 Unspecified glaucoma: Secondary | ICD-10-CM | POA: Diagnosis not present

## 2013-04-27 DIAGNOSIS — E785 Hyperlipidemia, unspecified: Secondary | ICD-10-CM | POA: Diagnosis not present

## 2013-04-27 DIAGNOSIS — R809 Proteinuria, unspecified: Secondary | ICD-10-CM | POA: Diagnosis not present

## 2013-04-27 DIAGNOSIS — R972 Elevated prostate specific antigen [PSA]: Secondary | ICD-10-CM | POA: Diagnosis not present

## 2013-04-27 DIAGNOSIS — Z1331 Encounter for screening for depression: Secondary | ICD-10-CM | POA: Diagnosis not present

## 2013-05-11 DIAGNOSIS — Z7901 Long term (current) use of anticoagulants: Secondary | ICD-10-CM | POA: Diagnosis not present

## 2013-05-11 DIAGNOSIS — I4891 Unspecified atrial fibrillation: Secondary | ICD-10-CM | POA: Diagnosis not present

## 2013-05-13 DIAGNOSIS — Z7901 Long term (current) use of anticoagulants: Secondary | ICD-10-CM | POA: Diagnosis not present

## 2013-05-13 DIAGNOSIS — I4891 Unspecified atrial fibrillation: Secondary | ICD-10-CM | POA: Diagnosis not present

## 2013-05-23 DIAGNOSIS — L608 Other nail disorders: Secondary | ICD-10-CM | POA: Diagnosis not present

## 2013-05-23 DIAGNOSIS — E1059 Type 1 diabetes mellitus with other circulatory complications: Secondary | ICD-10-CM | POA: Diagnosis not present

## 2013-05-23 DIAGNOSIS — I739 Peripheral vascular disease, unspecified: Secondary | ICD-10-CM | POA: Diagnosis not present

## 2013-05-26 DIAGNOSIS — I4891 Unspecified atrial fibrillation: Secondary | ICD-10-CM | POA: Diagnosis not present

## 2013-05-26 DIAGNOSIS — Z7901 Long term (current) use of anticoagulants: Secondary | ICD-10-CM | POA: Diagnosis not present

## 2013-05-26 DIAGNOSIS — E1129 Type 2 diabetes mellitus with other diabetic kidney complication: Secondary | ICD-10-CM | POA: Diagnosis not present

## 2013-06-15 DIAGNOSIS — E1129 Type 2 diabetes mellitus with other diabetic kidney complication: Secondary | ICD-10-CM | POA: Diagnosis not present

## 2013-06-15 DIAGNOSIS — R82998 Other abnormal findings in urine: Secondary | ICD-10-CM | POA: Diagnosis not present

## 2013-06-15 DIAGNOSIS — N529 Male erectile dysfunction, unspecified: Secondary | ICD-10-CM | POA: Diagnosis not present

## 2013-06-15 DIAGNOSIS — Z125 Encounter for screening for malignant neoplasm of prostate: Secondary | ICD-10-CM | POA: Diagnosis not present

## 2013-06-15 DIAGNOSIS — R972 Elevated prostate specific antigen [PSA]: Secondary | ICD-10-CM | POA: Diagnosis not present

## 2013-06-15 DIAGNOSIS — E1165 Type 2 diabetes mellitus with hyperglycemia: Secondary | ICD-10-CM | POA: Diagnosis not present

## 2013-06-23 DIAGNOSIS — Z7901 Long term (current) use of anticoagulants: Secondary | ICD-10-CM | POA: Diagnosis not present

## 2013-06-23 DIAGNOSIS — I4891 Unspecified atrial fibrillation: Secondary | ICD-10-CM | POA: Diagnosis not present

## 2013-06-28 DIAGNOSIS — Z1331 Encounter for screening for depression: Secondary | ICD-10-CM | POA: Diagnosis not present

## 2013-06-28 DIAGNOSIS — Z0000 Encounter for general adult medical examination without abnormal findings: Secondary | ICD-10-CM | POA: Diagnosis not present

## 2013-06-28 DIAGNOSIS — K449 Diaphragmatic hernia without obstruction or gangrene: Secondary | ICD-10-CM | POA: Diagnosis not present

## 2013-06-28 DIAGNOSIS — N182 Chronic kidney disease, stage 2 (mild): Secondary | ICD-10-CM | POA: Diagnosis not present

## 2013-06-28 DIAGNOSIS — Z1212 Encounter for screening for malignant neoplasm of rectum: Secondary | ICD-10-CM | POA: Diagnosis not present

## 2013-06-28 DIAGNOSIS — E785 Hyperlipidemia, unspecified: Secondary | ICD-10-CM | POA: Diagnosis not present

## 2013-06-28 DIAGNOSIS — Z7901 Long term (current) use of anticoagulants: Secondary | ICD-10-CM | POA: Diagnosis not present

## 2013-06-28 DIAGNOSIS — I10 Essential (primary) hypertension: Secondary | ICD-10-CM | POA: Diagnosis not present

## 2013-06-28 DIAGNOSIS — R972 Elevated prostate specific antigen [PSA]: Secondary | ICD-10-CM | POA: Diagnosis not present

## 2013-06-30 DIAGNOSIS — C61 Malignant neoplasm of prostate: Secondary | ICD-10-CM | POA: Diagnosis not present

## 2013-07-07 DIAGNOSIS — I4891 Unspecified atrial fibrillation: Secondary | ICD-10-CM | POA: Diagnosis not present

## 2013-07-07 DIAGNOSIS — Z7901 Long term (current) use of anticoagulants: Secondary | ICD-10-CM | POA: Diagnosis not present

## 2013-07-12 ENCOUNTER — Ambulatory Visit: Payer: Medicare Other | Admitting: Radiation Oncology

## 2013-07-12 ENCOUNTER — Encounter: Payer: Self-pay | Admitting: Radiation Oncology

## 2013-07-12 ENCOUNTER — Ambulatory Visit: Payer: Medicare Other

## 2013-07-12 NOTE — Progress Notes (Signed)
GU Location of Tumor / Histology: prostate  If Prostate Cancer, Gleason Score is (3 + 3) and PSA is (5.44 on 06/28/13), volume 45 cc  Patient presented 2013 with signs/symptoms of: elevated PSA, positive biopsy  Biopsies of prostate (if applicable) revealed:  82/9/93   Past/Anticipated interventions by urology, if any: pt had gold seeds placed but failed to FU afterwards  Past/Anticipated interventions by medical oncology, if any: none  Weight changes, if any: no  Bowel/Bladder complaints, if any:  Sensation of not emptying bladder, frequency, urgency, weak stream, nocturia x 1,  IPSS 10. Pt states his urinary frequency is less if he takes insulin and Metformin on consistent schedule.  Nausea/Vomiting, if any: no, appetite good per pt  Pain issues, if any:  no  SAFETY ISSUES:  Prior radiation? no  Pacemaker/ICD? no  Possible current pregnancy? na  Is the patient on methotrexate? no  Current Complaints / other details:  Widowed , 4 children, retired

## 2013-07-13 ENCOUNTER — Ambulatory Visit
Admission: RE | Admit: 2013-07-13 | Discharge: 2013-07-13 | Disposition: A | Discharge status: Home / Self Care | Payer: Medicare Other | Source: Ambulatory Visit | Attending: Radiation Oncology | Admitting: Radiation Oncology

## 2013-07-13 ENCOUNTER — Encounter: Payer: Self-pay | Admitting: Radiation Oncology

## 2013-07-13 VITALS — BP 141/76 | HR 95 | Temp 98.1°F | Resp 20 | Ht 73.0 in | Wt 242.4 lb

## 2013-07-13 DIAGNOSIS — E1149 Type 2 diabetes mellitus with other diabetic neurological complication: Secondary | ICD-10-CM | POA: Insufficient documentation

## 2013-07-13 DIAGNOSIS — C61 Malignant neoplasm of prostate: Secondary | ICD-10-CM | POA: Diagnosis not present

## 2013-07-13 DIAGNOSIS — E1142 Type 2 diabetes mellitus with diabetic polyneuropathy: Secondary | ICD-10-CM | POA: Insufficient documentation

## 2013-07-13 DIAGNOSIS — Z510 Encounter for antineoplastic radiation therapy: Secondary | ICD-10-CM | POA: Diagnosis not present

## 2013-07-13 HISTORY — DX: Polyneuropathy, unspecified: G62.9

## 2013-07-13 HISTORY — DX: Unspecified atrial fibrillation: I48.91

## 2013-07-13 NOTE — Progress Notes (Signed)
Please see the Nurse Progress Note in the MD Initial Consult Encounter for this patient. 

## 2013-07-13 NOTE — Progress Notes (Signed)
CC: Dr. Rolan Bucco, Dr. Domenick Gong  Diagnosis: Stage TI C. favorable risk adenocarcinoma prostate  History: Mr. Charles Friedman is a most pleasant 80 year old male who is seen today for discussion of possible radiation therapy in the management of his stage TI C. favorable risk adenocarcinoma. I first saw the patient in consultation on 01/29/2012 at which time he elected for external beam/IMRT. He had gold seeds placed for image guidance, but then elected to hold off on treatment because of a job commitment.  He was noted to have a slow rise in his PSA from 3.1 in December 2009 up to 5.5 by January 2012, more recently 5.6 on 11/17/2011 . He underwent ultrasound-guided biopsies on 12/25/2011 by Dr. Jasmine December. His then had Gleason 6 (3+3) involving 50% of one core from right lateral mid gland, 50% of one core from the right mid gland, 10% of one core from the right apex and 70% of one core from the left mid gland. 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 His gland volume was measured to be 45 cc. He is doing well from a GU and GI standpoint. He recently completed his employment and is now interested in proceeding with radiation therapy. His last PSA was 5.44 on 06/15/2013.   The physical examination: Alert and oriented. Filed Vitals:   07/13/13 1429  BP: 141/76  Pulse: 95  Temp: 98.1 F (36.7 C)  Resp: 20   Rectal examination not performed today.  Impression: Stage TI C. favorable risk adenocarcinoma prostate. Since he was last seen here approximately 1-1/2 years ago we reviewed management options for early stage favorable risk disease. We spent a great deal of time discussing close observation/surveillance which would require serial PSA determinations and a repeat prostate biopsies. We discussed the fact that he is more likely to die from a cardiac event rather than metastatic prostate cancer. If he wants to pursue curative treatment then  external beam/IMRT would be the most reasonable option. We again discussed the potential acute and late toxicities of radiation therapy. If he wants to pursue radiation therapy, then I do not feel that we  need to repeat his prostate biopsies at this time. He will go home and think things over, contact me if he wants to proceed with radiation therapy.  30 minutes was spent face-to-face with the patient, primarily counseling the patient.

## 2013-07-21 DIAGNOSIS — Z7901 Long term (current) use of anticoagulants: Secondary | ICD-10-CM | POA: Diagnosis not present

## 2013-07-21 DIAGNOSIS — I4891 Unspecified atrial fibrillation: Secondary | ICD-10-CM | POA: Diagnosis not present

## 2013-08-03 ENCOUNTER — Ambulatory Visit
Admission: RE | Admit: 2013-08-03 | Discharge: 2013-08-03 | Disposition: A | Discharge status: Home / Self Care | Payer: Medicare Other | Source: Ambulatory Visit | Attending: Radiation Oncology | Admitting: Radiation Oncology

## 2013-08-03 DIAGNOSIS — C61 Malignant neoplasm of prostate: Secondary | ICD-10-CM

## 2013-08-03 DIAGNOSIS — E1149 Type 2 diabetes mellitus with other diabetic neurological complication: Secondary | ICD-10-CM | POA: Diagnosis not present

## 2013-08-03 DIAGNOSIS — Z510 Encounter for antineoplastic radiation therapy: Secondary | ICD-10-CM | POA: Diagnosis not present

## 2013-08-03 DIAGNOSIS — E1142 Type 2 diabetes mellitus with diabetic polyneuropathy: Secondary | ICD-10-CM | POA: Diagnosis not present

## 2013-08-03 NOTE — Progress Notes (Signed)
Complex simulation/treatment planning note: The patient was taken to the CT simulator. He was placed supine. A Vac lock immobilization device was constructed. A red rubber tube was placed within the rectal vault. He was then catheterized and contrast instilled into the bladder/urethra. He was then scanned. I placed an isocenter along the central aspect of the prostate. The CT data set was sent to the MIM planning system for contouring his prostate, rectum, and bladder. He is now ready for IMRT simulation/treatment planning. I am prescribing 7600 cGy to the prostate PTV and 38 sessions. The prostate PTV represents the prostate was 0.8 cm except for 0.5 cm along the rectum. He is to be treated with a comfortably full bladder and undergo daily cone beam CT, setting up to his 3 gold seeds.

## 2013-08-04 ENCOUNTER — Encounter: Payer: Self-pay | Admitting: Radiation Oncology

## 2013-08-04 DIAGNOSIS — E1142 Type 2 diabetes mellitus with diabetic polyneuropathy: Secondary | ICD-10-CM | POA: Diagnosis not present

## 2013-08-04 DIAGNOSIS — E1149 Type 2 diabetes mellitus with other diabetic neurological complication: Secondary | ICD-10-CM | POA: Diagnosis not present

## 2013-08-04 DIAGNOSIS — Z510 Encounter for antineoplastic radiation therapy: Secondary | ICD-10-CM | POA: Diagnosis not present

## 2013-08-04 DIAGNOSIS — C61 Malignant neoplasm of prostate: Secondary | ICD-10-CM | POA: Diagnosis not present

## 2013-08-04 NOTE — Progress Notes (Signed)
IMRT simulation/treatment planning note: Charles Friedman completed his IMRT simulation/treatment planning in the management of his carcinoma the prostate. IMRT was chosen to decrease the risk for both acute and late bladder and rectal toxicity compared to conformal or 3-D conformal radiation therapy. Dose volume histograms were obtained for the target structure (prostate PTV 76) and avoidance structures including the bladder, rectum, and femoral heads. We met our departmental guidelines. I'm prescribing 7600 cGy in 30 sessions with dual ARC VMAT IMRT/6 MV photons. I am requesting treatment with a comfortably full bladder and have him undergo daily cone beam CT setting up to his 3 gold seeds for image guidance.

## 2013-08-07 DIAGNOSIS — E1149 Type 2 diabetes mellitus with other diabetic neurological complication: Secondary | ICD-10-CM | POA: Diagnosis not present

## 2013-08-07 DIAGNOSIS — Z510 Encounter for antineoplastic radiation therapy: Secondary | ICD-10-CM | POA: Diagnosis not present

## 2013-08-07 DIAGNOSIS — E1142 Type 2 diabetes mellitus with diabetic polyneuropathy: Secondary | ICD-10-CM | POA: Diagnosis not present

## 2013-08-07 DIAGNOSIS — C61 Malignant neoplasm of prostate: Secondary | ICD-10-CM | POA: Diagnosis not present

## 2013-08-15 ENCOUNTER — Encounter: Payer: Self-pay | Admitting: Radiation Oncology

## 2013-08-15 ENCOUNTER — Ambulatory Visit
Admission: RE | Admit: 2013-08-15 | Discharge: 2013-08-15 | Disposition: A | Discharge status: Home / Self Care | Payer: Medicare Other | Source: Ambulatory Visit | Attending: Radiation Oncology | Admitting: Radiation Oncology

## 2013-08-15 VITALS — BP 152/80 | HR 100 | Temp 98.3°F | Resp 20 | Wt 244.3 lb

## 2013-08-15 DIAGNOSIS — E1149 Type 2 diabetes mellitus with other diabetic neurological complication: Secondary | ICD-10-CM | POA: Diagnosis not present

## 2013-08-15 DIAGNOSIS — C61 Malignant neoplasm of prostate: Secondary | ICD-10-CM

## 2013-08-15 DIAGNOSIS — Z510 Encounter for antineoplastic radiation therapy: Secondary | ICD-10-CM | POA: Diagnosis not present

## 2013-08-15 DIAGNOSIS — E1142 Type 2 diabetes mellitus with diabetic polyneuropathy: Secondary | ICD-10-CM | POA: Diagnosis not present

## 2013-08-15 NOTE — Progress Notes (Signed)
Patient denies pain, fatigue, loss of appetite, urinary/bowel issues. Post sim ed completed w/pt. Gave pt "Radiation and You" booklet w/all pertinent information marked and discussed, re: rectal discomfort/care, fatigue, urinary/bladder irritation/management. Pt verbalized understanding.

## 2013-08-15 NOTE — Progress Notes (Signed)
   Weekly Management Note:  outpatient Current Dose:  2 Gy  Projected Dose: 76 Gy    ICD-9-CM  1. Prostate cancer 185   Narrative:  The patient presents for routine under treatment assessment.  CBCT/MVCT images/Port film x-rays were reviewed.  The chart was checked. No complaints  Physical Findings:  weight is 244 lb 4.8 oz (110.814 kg). His oral temperature is 98.3 F (36.8 C). His blood pressure is 152/80 and his pulse is 100. His respiration is 20.  NAD  Impression:  The patient is tolerating radiotherapy.  Plan:  Continue radiotherapy as planned.   ________________________________   Eppie Gibson, M.D.

## 2013-08-16 ENCOUNTER — Ambulatory Visit
Admission: RE | Admit: 2013-08-16 | Discharge: 2013-08-16 | Disposition: A | Discharge status: Home / Self Care | Payer: Medicare Other | Source: Ambulatory Visit | Attending: Radiation Oncology | Admitting: Radiation Oncology

## 2013-08-16 DIAGNOSIS — Z510 Encounter for antineoplastic radiation therapy: Secondary | ICD-10-CM | POA: Diagnosis not present

## 2013-08-16 DIAGNOSIS — E1149 Type 2 diabetes mellitus with other diabetic neurological complication: Secondary | ICD-10-CM | POA: Diagnosis not present

## 2013-08-16 DIAGNOSIS — C61 Malignant neoplasm of prostate: Secondary | ICD-10-CM | POA: Diagnosis not present

## 2013-08-16 DIAGNOSIS — E1142 Type 2 diabetes mellitus with diabetic polyneuropathy: Secondary | ICD-10-CM | POA: Diagnosis not present

## 2013-08-17 ENCOUNTER — Ambulatory Visit
Admission: RE | Admit: 2013-08-17 | Discharge: 2013-08-17 | Disposition: A | Discharge status: Home / Self Care | Payer: Medicare Other | Source: Ambulatory Visit | Attending: Radiation Oncology | Admitting: Radiation Oncology

## 2013-08-17 DIAGNOSIS — Z510 Encounter for antineoplastic radiation therapy: Secondary | ICD-10-CM | POA: Diagnosis not present

## 2013-08-17 DIAGNOSIS — E1149 Type 2 diabetes mellitus with other diabetic neurological complication: Secondary | ICD-10-CM | POA: Diagnosis not present

## 2013-08-17 DIAGNOSIS — E1142 Type 2 diabetes mellitus with diabetic polyneuropathy: Secondary | ICD-10-CM | POA: Diagnosis not present

## 2013-08-17 DIAGNOSIS — C61 Malignant neoplasm of prostate: Secondary | ICD-10-CM | POA: Diagnosis not present

## 2013-08-18 ENCOUNTER — Ambulatory Visit
Admission: RE | Admit: 2013-08-18 | Discharge: 2013-08-18 | Disposition: A | Discharge status: Home / Self Care | Payer: Medicare Other | Source: Ambulatory Visit | Attending: Radiation Oncology | Admitting: Radiation Oncology

## 2013-08-18 DIAGNOSIS — E1142 Type 2 diabetes mellitus with diabetic polyneuropathy: Secondary | ICD-10-CM | POA: Diagnosis not present

## 2013-08-18 DIAGNOSIS — Z510 Encounter for antineoplastic radiation therapy: Secondary | ICD-10-CM | POA: Diagnosis not present

## 2013-08-18 DIAGNOSIS — E1149 Type 2 diabetes mellitus with other diabetic neurological complication: Secondary | ICD-10-CM | POA: Diagnosis not present

## 2013-08-18 DIAGNOSIS — C61 Malignant neoplasm of prostate: Secondary | ICD-10-CM | POA: Diagnosis not present

## 2013-08-22 ENCOUNTER — Encounter: Payer: Self-pay | Admitting: Radiation Oncology

## 2013-08-22 ENCOUNTER — Ambulatory Visit
Admission: RE | Admit: 2013-08-22 | Discharge: 2013-08-22 | Disposition: A | Discharge status: Home / Self Care | Payer: Medicare Other | Source: Ambulatory Visit | Attending: Radiation Oncology | Admitting: Radiation Oncology

## 2013-08-22 VITALS — BP 136/83 | HR 105 | Resp 16 | Wt 245.1 lb

## 2013-08-22 DIAGNOSIS — E1142 Type 2 diabetes mellitus with diabetic polyneuropathy: Secondary | ICD-10-CM | POA: Diagnosis not present

## 2013-08-22 DIAGNOSIS — C61 Malignant neoplasm of prostate: Secondary | ICD-10-CM | POA: Diagnosis not present

## 2013-08-22 DIAGNOSIS — E1149 Type 2 diabetes mellitus with other diabetic neurological complication: Secondary | ICD-10-CM | POA: Diagnosis not present

## 2013-08-22 DIAGNOSIS — Z510 Encounter for antineoplastic radiation therapy: Secondary | ICD-10-CM | POA: Diagnosis not present

## 2013-08-22 MED ORDER — TAMSULOSIN HCL 0.4 MG PO CAPS: 0.4000 mg | ORAL_CAPSULE | Freq: Every day | ORAL | Status: DC

## 2013-08-22 NOTE — Progress Notes (Signed)
Vitals and weight stable. Denies pain. Denies hematuria, nocturia or dysuria. Reports only change from last PUT is that it "take him longer to empty his bladder completely." Denies fatigue.  Denies diarrhea.

## 2013-08-22 NOTE — Progress Notes (Signed)
Weekly Management Note:  Site: Prostate Current Dose:  5000  cGy Projected Dose: 7600  cGy  Narrative: The patient is seen today for routine under treatment assessment. CBCT/MVCT images/port films were reviewed. The chart was reviewed.   Bladder filling is satisfactory but less than ideal. He states that it takes him longer to empty his bladder. No GI difficulties.  Physical Examination:  Filed Vitals:   08/22/13 1208  BP: 136/83  Pulse: 105  Resp: 16  .  Weight: 245 lb 1.6 oz (111.177 kg). No change.  Impression: Tolerating radiation therapy well. I believe that he does have some obstructive symptomatology, and I will start him on tamsulosin.  Plan: Continue radiation therapy as planned.

## 2013-08-23 ENCOUNTER — Ambulatory Visit
Admission: RE | Admit: 2013-08-23 | Discharge: 2013-08-23 | Disposition: A | Discharge status: Home / Self Care | Payer: Medicare Other | Source: Ambulatory Visit | Attending: Radiation Oncology | Admitting: Radiation Oncology

## 2013-08-23 DIAGNOSIS — C61 Malignant neoplasm of prostate: Secondary | ICD-10-CM | POA: Diagnosis not present

## 2013-08-23 DIAGNOSIS — Z510 Encounter for antineoplastic radiation therapy: Secondary | ICD-10-CM | POA: Diagnosis not present

## 2013-08-23 DIAGNOSIS — E1149 Type 2 diabetes mellitus with other diabetic neurological complication: Secondary | ICD-10-CM | POA: Diagnosis not present

## 2013-08-23 DIAGNOSIS — E1142 Type 2 diabetes mellitus with diabetic polyneuropathy: Secondary | ICD-10-CM | POA: Diagnosis not present

## 2013-08-24 ENCOUNTER — Ambulatory Visit
Admission: RE | Admit: 2013-08-24 | Discharge: 2013-08-24 | Disposition: A | Discharge status: Home / Self Care | Payer: Medicare Other | Source: Ambulatory Visit | Attending: Radiation Oncology | Admitting: Radiation Oncology

## 2013-08-24 DIAGNOSIS — E1149 Type 2 diabetes mellitus with other diabetic neurological complication: Secondary | ICD-10-CM | POA: Diagnosis not present

## 2013-08-24 DIAGNOSIS — I4891 Unspecified atrial fibrillation: Secondary | ICD-10-CM | POA: Diagnosis not present

## 2013-08-24 DIAGNOSIS — E1142 Type 2 diabetes mellitus with diabetic polyneuropathy: Secondary | ICD-10-CM | POA: Diagnosis not present

## 2013-08-24 DIAGNOSIS — C61 Malignant neoplasm of prostate: Secondary | ICD-10-CM | POA: Diagnosis not present

## 2013-08-24 DIAGNOSIS — Z510 Encounter for antineoplastic radiation therapy: Secondary | ICD-10-CM | POA: Diagnosis not present

## 2013-08-24 DIAGNOSIS — Z7901 Long term (current) use of anticoagulants: Secondary | ICD-10-CM | POA: Diagnosis not present

## 2013-08-25 ENCOUNTER — Ambulatory Visit
Admission: RE | Admit: 2013-08-25 | Discharge: 2013-08-25 | Disposition: A | Discharge status: Home / Self Care | Payer: Medicare Other | Source: Ambulatory Visit | Attending: Radiation Oncology | Admitting: Radiation Oncology

## 2013-08-25 DIAGNOSIS — E1142 Type 2 diabetes mellitus with diabetic polyneuropathy: Secondary | ICD-10-CM | POA: Diagnosis not present

## 2013-08-25 DIAGNOSIS — Z510 Encounter for antineoplastic radiation therapy: Secondary | ICD-10-CM | POA: Diagnosis not present

## 2013-08-25 DIAGNOSIS — C61 Malignant neoplasm of prostate: Secondary | ICD-10-CM | POA: Diagnosis not present

## 2013-08-25 DIAGNOSIS — E1149 Type 2 diabetes mellitus with other diabetic neurological complication: Secondary | ICD-10-CM | POA: Diagnosis not present

## 2013-08-26 ENCOUNTER — Ambulatory Visit
Admission: RE | Admit: 2013-08-26 | Discharge: 2013-08-26 | Disposition: A | Discharge status: Home / Self Care | Payer: Medicare Other | Source: Ambulatory Visit | Attending: Radiation Oncology | Admitting: Radiation Oncology

## 2013-08-26 DIAGNOSIS — E1142 Type 2 diabetes mellitus with diabetic polyneuropathy: Secondary | ICD-10-CM | POA: Diagnosis not present

## 2013-08-26 DIAGNOSIS — Z510 Encounter for antineoplastic radiation therapy: Secondary | ICD-10-CM | POA: Diagnosis not present

## 2013-08-26 DIAGNOSIS — E1149 Type 2 diabetes mellitus with other diabetic neurological complication: Secondary | ICD-10-CM | POA: Diagnosis not present

## 2013-08-26 DIAGNOSIS — C61 Malignant neoplasm of prostate: Secondary | ICD-10-CM | POA: Diagnosis not present

## 2013-08-29 ENCOUNTER — Ambulatory Visit
Admission: RE | Admit: 2013-08-29 | Discharge: 2013-08-29 | Disposition: A | Discharge status: Home / Self Care | Payer: Medicare Other | Source: Ambulatory Visit | Attending: Radiation Oncology | Admitting: Radiation Oncology

## 2013-08-29 ENCOUNTER — Encounter: Payer: Self-pay | Admitting: Radiation Oncology

## 2013-08-29 VITALS — BP 148/87 | HR 101 | Resp 16 | Wt 242.9 lb

## 2013-08-29 DIAGNOSIS — C61 Malignant neoplasm of prostate: Secondary | ICD-10-CM | POA: Diagnosis not present

## 2013-08-29 DIAGNOSIS — Z510 Encounter for antineoplastic radiation therapy: Secondary | ICD-10-CM | POA: Diagnosis not present

## 2013-08-29 DIAGNOSIS — E1142 Type 2 diabetes mellitus with diabetic polyneuropathy: Secondary | ICD-10-CM | POA: Diagnosis not present

## 2013-08-29 DIAGNOSIS — E1149 Type 2 diabetes mellitus with other diabetic neurological complication: Secondary | ICD-10-CM | POA: Diagnosis not present

## 2013-08-29 NOTE — Progress Notes (Signed)
Weekly Management Note:  Site: Prostate Current Dose:  2000  cGy Projected Dose: 7600  cGy  Narrative: The patient is seen today for routine under treatment assessment. CBCT/MVCT images/port films were reviewed. The chart was reviewed.   Bladder filling is satisfactory, but could improve. Flomax is helpful. No new GU or GI difficulty.  Physical Examination:  Filed Vitals:   08/29/13 1202  BP: 148/87  Pulse: 101  Resp: 16  .  Weight: 242 lb 14.4 oz (110.179 kg). No change.   Impression: Tolerating radiation therapy well. I instructed him to improve his bladder filling.  Plan: Continue radiation therapy as planned.

## 2013-08-29 NOTE — Progress Notes (Signed)
Reports that script given by Dr. Valere Dross last week helps him to empty his bladder more completely and quickly. Reports nocturia x 3. Denies dysuria or hematuria. Denies diarrhea. Vitals and weight stable. Denies pain.

## 2013-08-30 ENCOUNTER — Ambulatory Visit
Admission: RE | Admit: 2013-08-30 | Discharge: 2013-08-30 | Disposition: A | Discharge status: Home / Self Care | Payer: Medicare Other | Source: Ambulatory Visit | Attending: Radiation Oncology | Admitting: Radiation Oncology

## 2013-08-30 DIAGNOSIS — E1149 Type 2 diabetes mellitus with other diabetic neurological complication: Secondary | ICD-10-CM | POA: Diagnosis not present

## 2013-08-30 DIAGNOSIS — E1142 Type 2 diabetes mellitus with diabetic polyneuropathy: Secondary | ICD-10-CM | POA: Diagnosis not present

## 2013-08-30 DIAGNOSIS — Z510 Encounter for antineoplastic radiation therapy: Secondary | ICD-10-CM | POA: Diagnosis not present

## 2013-08-30 DIAGNOSIS — C61 Malignant neoplasm of prostate: Secondary | ICD-10-CM | POA: Diagnosis not present

## 2013-08-31 ENCOUNTER — Ambulatory Visit
Admission: RE | Admit: 2013-08-31 | Discharge: 2013-08-31 | Disposition: A | Discharge status: Home / Self Care | Payer: Medicare Other | Source: Ambulatory Visit | Attending: Radiation Oncology | Admitting: Radiation Oncology

## 2013-08-31 DIAGNOSIS — E1142 Type 2 diabetes mellitus with diabetic polyneuropathy: Secondary | ICD-10-CM | POA: Diagnosis not present

## 2013-08-31 DIAGNOSIS — C61 Malignant neoplasm of prostate: Secondary | ICD-10-CM | POA: Diagnosis not present

## 2013-08-31 DIAGNOSIS — Z510 Encounter for antineoplastic radiation therapy: Secondary | ICD-10-CM | POA: Diagnosis not present

## 2013-08-31 DIAGNOSIS — E1149 Type 2 diabetes mellitus with other diabetic neurological complication: Secondary | ICD-10-CM | POA: Diagnosis not present

## 2013-09-01 ENCOUNTER — Ambulatory Visit
Admission: RE | Admit: 2013-09-01 | Discharge: 2013-09-01 | Disposition: A | Discharge status: Home / Self Care | Payer: Medicare Other | Source: Ambulatory Visit | Attending: Radiation Oncology | Admitting: Radiation Oncology

## 2013-09-01 DIAGNOSIS — Z510 Encounter for antineoplastic radiation therapy: Secondary | ICD-10-CM | POA: Diagnosis not present

## 2013-09-01 DIAGNOSIS — E1149 Type 2 diabetes mellitus with other diabetic neurological complication: Secondary | ICD-10-CM | POA: Diagnosis not present

## 2013-09-01 DIAGNOSIS — E1142 Type 2 diabetes mellitus with diabetic polyneuropathy: Secondary | ICD-10-CM | POA: Diagnosis not present

## 2013-09-01 DIAGNOSIS — C61 Malignant neoplasm of prostate: Secondary | ICD-10-CM | POA: Diagnosis not present

## 2013-09-02 ENCOUNTER — Ambulatory Visit
Admission: RE | Admit: 2013-09-02 | Discharge: 2013-09-02 | Disposition: A | Discharge status: Home / Self Care | Payer: Medicare Other | Source: Ambulatory Visit | Attending: Radiation Oncology | Admitting: Radiation Oncology

## 2013-09-02 DIAGNOSIS — E1142 Type 2 diabetes mellitus with diabetic polyneuropathy: Secondary | ICD-10-CM | POA: Diagnosis not present

## 2013-09-02 DIAGNOSIS — E1149 Type 2 diabetes mellitus with other diabetic neurological complication: Secondary | ICD-10-CM | POA: Diagnosis not present

## 2013-09-02 DIAGNOSIS — C61 Malignant neoplasm of prostate: Secondary | ICD-10-CM | POA: Diagnosis not present

## 2013-09-02 DIAGNOSIS — Z510 Encounter for antineoplastic radiation therapy: Secondary | ICD-10-CM | POA: Diagnosis not present

## 2013-09-05 ENCOUNTER — Ambulatory Visit
Admission: RE | Admit: 2013-09-05 | Discharge: 2013-09-05 | Disposition: A | Discharge status: Home / Self Care | Payer: Medicare Other | Source: Ambulatory Visit | Attending: Radiation Oncology | Admitting: Radiation Oncology

## 2013-09-05 VITALS — BP 155/77 | HR 106 | Temp 97.8°F | Resp 20 | Wt 242.8 lb

## 2013-09-05 DIAGNOSIS — E1142 Type 2 diabetes mellitus with diabetic polyneuropathy: Secondary | ICD-10-CM | POA: Diagnosis not present

## 2013-09-05 DIAGNOSIS — Z510 Encounter for antineoplastic radiation therapy: Secondary | ICD-10-CM | POA: Diagnosis not present

## 2013-09-05 DIAGNOSIS — C61 Malignant neoplasm of prostate: Secondary | ICD-10-CM

## 2013-09-05 DIAGNOSIS — E1149 Type 2 diabetes mellitus with other diabetic neurological complication: Secondary | ICD-10-CM | POA: Diagnosis not present

## 2013-09-05 NOTE — Progress Notes (Signed)
   Weekly Management Note:  outpatient    ICD-9-CM  1. Prostate cancer 185    Current Dose:  30 Gy  Projected Dose: 76 Gy   Narrative:  The patient presents for routine under treatment assessment.  CBCT/MVCT images/Port film x-rays were reviewed.  The chart was checked. denies pain, loss of appetite, fatigue beyond his baseline. He states that after beginning Flomax on 08/22/13 he has had balance issues. He is using a cane today. Reports he feels safe with cane. Has grip bar in shower. He denies falling. No lightheadedness or pre-syncope.He states Flomax has improved his difficulty emptying his bladder, denies any other urinary issues except occasional urgency. He states his bowels are more frequent but no diarrhea. Patient did not mention feeling of being off balance to Dr Valere Dross last week stating it was not much of an issue at that time.  Physical Findings:  weight is 242 lb 12.8 oz (110.133 kg). His oral temperature is 97.8 F (36.6 C). His blood pressure is 155/77 and his pulse is 106. His respiration is 20.  NAD, walks with cane  Impression:  The patient is tolerating radiotherapy.  Plan:  Continue radiotherapy as planned.  Balance issues may be from Flomax, but pt denies lightheadness or pre syncope. Flomax is helping bladder emptying a lot. Advised pt take Flomax nightly instead of 10:30 am which is his current time.  Offered OT assessment of his home for fall risk/ safety. Continue using cane and standing up with caution. He is amenable to this. Dr Valere Dross may reasses next week as to whether continuing Flomax is worthwhile. ________________________________   Eppie Gibson, M.D.

## 2013-09-05 NOTE — Progress Notes (Signed)
Patient denies pain, loss of appetite, fatigue beyond his baseline. He states that after beginning Flomax on 08/22/13 he has had balance issues. He is using a cane today. He denies falling. He states Flomax has improved his difficulty emptying his bladder, denies any other urinary issues except occasional urgency. He states his bowels are more frequent but no diarrhea. Patient did not mention feeling of being off balance to Dr Valere Dross last week stating it was not much of an issue at that time. Advised pt take Flomax nightly instead of 10:30 am which is his current time. Pt verbalized understanding.

## 2013-09-06 ENCOUNTER — Ambulatory Visit
Admission: RE | Admit: 2013-09-06 | Discharge: 2013-09-06 | Disposition: A | Discharge status: Home / Self Care | Payer: Medicare Other | Source: Ambulatory Visit | Attending: Radiation Oncology | Admitting: Radiation Oncology

## 2013-09-06 DIAGNOSIS — I739 Peripheral vascular disease, unspecified: Secondary | ICD-10-CM | POA: Diagnosis not present

## 2013-09-06 DIAGNOSIS — Z510 Encounter for antineoplastic radiation therapy: Secondary | ICD-10-CM | POA: Diagnosis not present

## 2013-09-06 DIAGNOSIS — E1059 Type 1 diabetes mellitus with other circulatory complications: Secondary | ICD-10-CM | POA: Diagnosis not present

## 2013-09-06 DIAGNOSIS — E1149 Type 2 diabetes mellitus with other diabetic neurological complication: Secondary | ICD-10-CM | POA: Diagnosis not present

## 2013-09-06 DIAGNOSIS — C61 Malignant neoplasm of prostate: Secondary | ICD-10-CM | POA: Diagnosis not present

## 2013-09-06 DIAGNOSIS — L608 Other nail disorders: Secondary | ICD-10-CM | POA: Diagnosis not present

## 2013-09-06 DIAGNOSIS — E1142 Type 2 diabetes mellitus with diabetic polyneuropathy: Secondary | ICD-10-CM | POA: Diagnosis not present

## 2013-09-07 ENCOUNTER — Ambulatory Visit
Admission: RE | Admit: 2013-09-07 | Discharge: 2013-09-07 | Disposition: A | Discharge status: Home / Self Care | Payer: Medicare Other | Source: Ambulatory Visit | Attending: Radiation Oncology | Admitting: Radiation Oncology

## 2013-09-07 DIAGNOSIS — E1142 Type 2 diabetes mellitus with diabetic polyneuropathy: Secondary | ICD-10-CM | POA: Diagnosis not present

## 2013-09-07 DIAGNOSIS — Z510 Encounter for antineoplastic radiation therapy: Secondary | ICD-10-CM | POA: Diagnosis not present

## 2013-09-07 DIAGNOSIS — E1149 Type 2 diabetes mellitus with other diabetic neurological complication: Secondary | ICD-10-CM | POA: Diagnosis not present

## 2013-09-07 DIAGNOSIS — C61 Malignant neoplasm of prostate: Secondary | ICD-10-CM | POA: Diagnosis not present

## 2013-09-08 ENCOUNTER — Ambulatory Visit
Admission: RE | Admit: 2013-09-08 | Discharge: 2013-09-08 | Disposition: A | Discharge status: Home / Self Care | Payer: Medicare Other | Source: Ambulatory Visit | Attending: Radiation Oncology | Admitting: Radiation Oncology

## 2013-09-08 DIAGNOSIS — C61 Malignant neoplasm of prostate: Secondary | ICD-10-CM | POA: Diagnosis not present

## 2013-09-08 DIAGNOSIS — Z510 Encounter for antineoplastic radiation therapy: Secondary | ICD-10-CM | POA: Diagnosis not present

## 2013-09-08 DIAGNOSIS — E1149 Type 2 diabetes mellitus with other diabetic neurological complication: Secondary | ICD-10-CM | POA: Diagnosis not present

## 2013-09-08 DIAGNOSIS — E1142 Type 2 diabetes mellitus with diabetic polyneuropathy: Secondary | ICD-10-CM | POA: Diagnosis not present

## 2013-09-09 ENCOUNTER — Ambulatory Visit
Admission: RE | Admit: 2013-09-09 | Discharge: 2013-09-09 | Disposition: A | Discharge status: Home / Self Care | Payer: Medicare Other | Source: Ambulatory Visit | Attending: Radiation Oncology | Admitting: Radiation Oncology

## 2013-09-09 ENCOUNTER — Other Ambulatory Visit: Payer: Self-pay | Admitting: Radiation Oncology

## 2013-09-09 DIAGNOSIS — Z510 Encounter for antineoplastic radiation therapy: Secondary | ICD-10-CM | POA: Diagnosis not present

## 2013-09-09 DIAGNOSIS — C61 Malignant neoplasm of prostate: Secondary | ICD-10-CM

## 2013-09-09 DIAGNOSIS — E1142 Type 2 diabetes mellitus with diabetic polyneuropathy: Secondary | ICD-10-CM | POA: Diagnosis not present

## 2013-09-09 DIAGNOSIS — E1149 Type 2 diabetes mellitus with other diabetic neurological complication: Secondary | ICD-10-CM | POA: Diagnosis not present

## 2013-09-12 ENCOUNTER — Encounter: Payer: Self-pay | Admitting: Radiation Oncology

## 2013-09-12 ENCOUNTER — Ambulatory Visit
Admission: RE | Admit: 2013-09-12 | Discharge: 2013-09-12 | Disposition: A | Discharge status: Home / Self Care | Payer: Medicare Other | Source: Ambulatory Visit | Attending: Radiation Oncology | Admitting: Radiation Oncology

## 2013-09-12 VITALS — BP 140/84 | HR 102 | Temp 98.0°F | Ht 73.0 in | Wt 241.0 lb

## 2013-09-12 DIAGNOSIS — E1149 Type 2 diabetes mellitus with other diabetic neurological complication: Secondary | ICD-10-CM | POA: Diagnosis not present

## 2013-09-12 DIAGNOSIS — Z510 Encounter for antineoplastic radiation therapy: Secondary | ICD-10-CM | POA: Diagnosis not present

## 2013-09-12 DIAGNOSIS — C61 Malignant neoplasm of prostate: Secondary | ICD-10-CM

## 2013-09-12 DIAGNOSIS — E1142 Type 2 diabetes mellitus with diabetic polyneuropathy: Secondary | ICD-10-CM | POA: Diagnosis not present

## 2013-09-12 NOTE — Progress Notes (Signed)
Charles Friedman has completed 20 fractions to his prostate.  He denies pain.  He reports that Flomax is working like a laxative for him.  He reports that the amount of loose stools he has varies from day to day.  Advised him to try Imodium as needed.  He reports getting up 3-4 times per night to urinate.  He denies hematuria and dysuria.  He reports occasional fatigue.  He reports falling Sunday morning at 3 am.  He slipped and "hit the floor" and scrapped his right knee and hit his right hip.  He reports his right hip is sore.  He denies hitting his head.

## 2013-09-12 NOTE — Progress Notes (Signed)
Weekly Management Note:  Site: Prostate Current Dose:  1000  cGy Projected Dose: 7600  cGy  Narrative: The patient is seen today for routine under treatment assessment. CBCT/MVCT images/port films were reviewed. The chart was reviewed.   Bladder filling is not ideal. He denies feeling his bladder was filled today. He continues to have nocturia x3-4, but he believes that Flomax is helpful. He does have occasional loosening of his bowels which is not expected. He did fall this past Sunday when he "slipped". I believe that he had a previous fall and has been referred to occupational therapy. He did not report any lightheadedness or syncope.  Physical Examination:  Filed Vitals:   09/12/13 1149  BP: 140/84  Pulse: 102  Temp: 98 F (36.7 C)  .  Weight: 241 lb (109.317 kg). No change.  Impression: Tolerating radiation therapy well, however, he does have moderate obstructive symptomatology . I do not feel that he is having orthostatic hypotension and that he denies lightheadedness or syncope. In any case, I cautioned him about falling, and he should sit on his bedside moving his legs before trying to ambulate at night. I also recommended that he use a cane or walker at night.  Plan: Continue radiation therapy as planned.

## 2013-09-13 ENCOUNTER — Ambulatory Visit
Admission: RE | Admit: 2013-09-13 | Discharge: 2013-09-13 | Disposition: A | Discharge status: Home / Self Care | Payer: Medicare Other | Source: Ambulatory Visit | Attending: Radiation Oncology | Admitting: Radiation Oncology

## 2013-09-13 DIAGNOSIS — E1142 Type 2 diabetes mellitus with diabetic polyneuropathy: Secondary | ICD-10-CM | POA: Diagnosis not present

## 2013-09-13 DIAGNOSIS — C61 Malignant neoplasm of prostate: Secondary | ICD-10-CM | POA: Diagnosis not present

## 2013-09-13 DIAGNOSIS — Z510 Encounter for antineoplastic radiation therapy: Secondary | ICD-10-CM | POA: Diagnosis not present

## 2013-09-13 DIAGNOSIS — E1149 Type 2 diabetes mellitus with other diabetic neurological complication: Secondary | ICD-10-CM | POA: Diagnosis not present

## 2013-09-14 ENCOUNTER — Ambulatory Visit
Admission: RE | Admit: 2013-09-14 | Discharge: 2013-09-14 | Disposition: A | Discharge status: Home / Self Care | Payer: Medicare Other | Source: Ambulatory Visit | Attending: Radiation Oncology | Admitting: Radiation Oncology

## 2013-09-14 DIAGNOSIS — E1149 Type 2 diabetes mellitus with other diabetic neurological complication: Secondary | ICD-10-CM | POA: Diagnosis not present

## 2013-09-14 DIAGNOSIS — E1142 Type 2 diabetes mellitus with diabetic polyneuropathy: Secondary | ICD-10-CM | POA: Diagnosis not present

## 2013-09-14 DIAGNOSIS — C61 Malignant neoplasm of prostate: Secondary | ICD-10-CM | POA: Diagnosis not present

## 2013-09-14 DIAGNOSIS — Z510 Encounter for antineoplastic radiation therapy: Secondary | ICD-10-CM | POA: Diagnosis not present

## 2013-09-15 ENCOUNTER — Ambulatory Visit
Admission: RE | Admit: 2013-09-15 | Discharge: 2013-09-15 | Disposition: A | Discharge status: Home / Self Care | Payer: Medicare Other | Source: Ambulatory Visit | Attending: Radiation Oncology | Admitting: Radiation Oncology

## 2013-09-15 DIAGNOSIS — Z510 Encounter for antineoplastic radiation therapy: Secondary | ICD-10-CM | POA: Diagnosis not present

## 2013-09-15 DIAGNOSIS — C61 Malignant neoplasm of prostate: Secondary | ICD-10-CM | POA: Diagnosis not present

## 2013-09-15 DIAGNOSIS — E1149 Type 2 diabetes mellitus with other diabetic neurological complication: Secondary | ICD-10-CM | POA: Diagnosis not present

## 2013-09-15 DIAGNOSIS — E1142 Type 2 diabetes mellitus with diabetic polyneuropathy: Secondary | ICD-10-CM | POA: Diagnosis not present

## 2013-09-16 ENCOUNTER — Ambulatory Visit
Admission: RE | Admit: 2013-09-16 | Discharge: 2013-09-16 | Disposition: A | Discharge status: Home / Self Care | Payer: Medicare Other | Source: Ambulatory Visit | Attending: Radiation Oncology | Admitting: Radiation Oncology

## 2013-09-16 DIAGNOSIS — E1142 Type 2 diabetes mellitus with diabetic polyneuropathy: Secondary | ICD-10-CM | POA: Diagnosis not present

## 2013-09-16 DIAGNOSIS — Z510 Encounter for antineoplastic radiation therapy: Secondary | ICD-10-CM | POA: Diagnosis not present

## 2013-09-16 DIAGNOSIS — E1149 Type 2 diabetes mellitus with other diabetic neurological complication: Secondary | ICD-10-CM | POA: Diagnosis not present

## 2013-09-16 DIAGNOSIS — C61 Malignant neoplasm of prostate: Secondary | ICD-10-CM | POA: Diagnosis not present

## 2013-09-19 ENCOUNTER — Ambulatory Visit
Admission: RE | Admit: 2013-09-19 | Discharge: 2013-09-19 | Disposition: A | Discharge status: Home / Self Care | Payer: Medicare Other | Source: Ambulatory Visit | Attending: Radiation Oncology | Admitting: Radiation Oncology

## 2013-09-19 ENCOUNTER — Encounter: Payer: Self-pay | Admitting: Radiation Oncology

## 2013-09-19 VITALS — BP 145/83 | HR 105 | Temp 97.5°F | Resp 20 | Wt 233.7 lb

## 2013-09-19 DIAGNOSIS — C61 Malignant neoplasm of prostate: Secondary | ICD-10-CM

## 2013-09-19 DIAGNOSIS — Z510 Encounter for antineoplastic radiation therapy: Secondary | ICD-10-CM | POA: Diagnosis not present

## 2013-09-19 DIAGNOSIS — E1149 Type 2 diabetes mellitus with other diabetic neurological complication: Secondary | ICD-10-CM | POA: Diagnosis not present

## 2013-09-19 DIAGNOSIS — E1142 Type 2 diabetes mellitus with diabetic polyneuropathy: Secondary | ICD-10-CM | POA: Diagnosis not present

## 2013-09-19 NOTE — Progress Notes (Signed)
CC: Dr. Rolan Bucco   Weekly Management Note:  Site: Prostate Current Dose:  5000  cGy Projected Dose: 7600  cGy  Narrative: The patient is seen today for routine under treatment assessment. CBCT/MVCT images/port films were reviewed. The chart was reviewed.   Bladder filling is excellent. He continues to have a slow urinary stream despite being on Flomax for the past 3+ weeks. He does not feel that he completely empties his bladder. He tells me he is losing sleep. He has nocturia x4-5 where his baseline was x1 prior to initiation of radiation therapy. No GI difficulties.  Physical Examination:  Filed Vitals:   09/19/13 1211  BP: 145/83  Pulse: 105  Temp: 97.5 F (36.4 C)  Resp: 20  .  Weight: 233 lb 11.2 oz (106.006 kg). No change  Impression: Tolerating radiation therapy well except for worsening obstructive symptomatology. I asked Charles Friedman to contact [facility] disease would benefit from another alpha-blocker. I am concerned about doubling up on his Flomax because of a history of recent falls which is presumed to be secondary to his neuropathy rather than orthostatic hypotension.  Plan: Continue radiation therapy as planned.

## 2013-09-19 NOTE — Progress Notes (Signed)
Weekly PUT Progress Note - Prostate  Bladder   taking Flomax nightly and nocturia x 5 last night  Bowel denies issues  Medications taken: na  Pain Pain Assessment Pain Score: 0-No pain  Fatigue No, using a cane and denies falling since initial fall  Loss of appetite "eating less"

## 2013-09-20 ENCOUNTER — Ambulatory Visit
Admission: RE | Admit: 2013-09-20 | Discharge: 2013-09-20 | Disposition: A | Discharge status: Home / Self Care | Payer: Medicare Other | Source: Ambulatory Visit | Attending: Radiation Oncology | Admitting: Radiation Oncology

## 2013-09-20 DIAGNOSIS — Z510 Encounter for antineoplastic radiation therapy: Secondary | ICD-10-CM | POA: Diagnosis not present

## 2013-09-20 DIAGNOSIS — E1149 Type 2 diabetes mellitus with other diabetic neurological complication: Secondary | ICD-10-CM | POA: Diagnosis not present

## 2013-09-20 DIAGNOSIS — E1142 Type 2 diabetes mellitus with diabetic polyneuropathy: Secondary | ICD-10-CM | POA: Diagnosis not present

## 2013-09-20 DIAGNOSIS — C61 Malignant neoplasm of prostate: Secondary | ICD-10-CM | POA: Diagnosis not present

## 2013-09-21 ENCOUNTER — Ambulatory Visit
Admission: RE | Admit: 2013-09-21 | Discharge: 2013-09-21 | Disposition: A | Discharge status: Home / Self Care | Payer: Medicare Other | Source: Ambulatory Visit | Attending: Radiation Oncology | Admitting: Radiation Oncology

## 2013-09-21 DIAGNOSIS — E1142 Type 2 diabetes mellitus with diabetic polyneuropathy: Secondary | ICD-10-CM | POA: Diagnosis not present

## 2013-09-21 DIAGNOSIS — C61 Malignant neoplasm of prostate: Secondary | ICD-10-CM | POA: Diagnosis not present

## 2013-09-21 DIAGNOSIS — Z510 Encounter for antineoplastic radiation therapy: Secondary | ICD-10-CM | POA: Diagnosis not present

## 2013-09-21 DIAGNOSIS — E1149 Type 2 diabetes mellitus with other diabetic neurological complication: Secondary | ICD-10-CM | POA: Diagnosis not present

## 2013-09-22 ENCOUNTER — Ambulatory Visit
Admission: RE | Admit: 2013-09-22 | Discharge: 2013-09-22 | Disposition: A | Discharge status: Home / Self Care | Payer: Medicare Other | Source: Ambulatory Visit | Attending: Radiation Oncology | Admitting: Radiation Oncology

## 2013-09-22 DIAGNOSIS — C61 Malignant neoplasm of prostate: Secondary | ICD-10-CM | POA: Diagnosis not present

## 2013-09-22 DIAGNOSIS — Z510 Encounter for antineoplastic radiation therapy: Secondary | ICD-10-CM | POA: Diagnosis not present

## 2013-09-22 DIAGNOSIS — E1142 Type 2 diabetes mellitus with diabetic polyneuropathy: Secondary | ICD-10-CM | POA: Diagnosis not present

## 2013-09-22 DIAGNOSIS — E1149 Type 2 diabetes mellitus with other diabetic neurological complication: Secondary | ICD-10-CM | POA: Diagnosis not present

## 2013-09-23 ENCOUNTER — Ambulatory Visit
Admission: RE | Admit: 2013-09-23 | Discharge: 2013-09-23 | Disposition: A | Discharge status: Home / Self Care | Payer: Medicare Other | Source: Ambulatory Visit | Attending: Radiation Oncology | Admitting: Radiation Oncology

## 2013-09-23 DIAGNOSIS — E1142 Type 2 diabetes mellitus with diabetic polyneuropathy: Secondary | ICD-10-CM | POA: Diagnosis not present

## 2013-09-23 DIAGNOSIS — C61 Malignant neoplasm of prostate: Secondary | ICD-10-CM | POA: Diagnosis not present

## 2013-09-23 DIAGNOSIS — Z510 Encounter for antineoplastic radiation therapy: Secondary | ICD-10-CM | POA: Diagnosis not present

## 2013-09-23 DIAGNOSIS — E1149 Type 2 diabetes mellitus with other diabetic neurological complication: Secondary | ICD-10-CM | POA: Diagnosis not present

## 2013-09-26 ENCOUNTER — Ambulatory Visit
Admission: RE | Admit: 2013-09-26 | Discharge: 2013-09-26 | Disposition: A | Discharge status: Home / Self Care | Payer: Medicare Other | Source: Ambulatory Visit | Attending: Radiation Oncology | Admitting: Radiation Oncology

## 2013-09-26 ENCOUNTER — Emergency Department (HOSPITAL_COMMUNITY)
Admission: EM | Admit: 2013-09-26 | Discharge: 2013-09-26 | Disposition: A | Discharge status: Home / Self Care | Payer: Medicare Other | Attending: Emergency Medicine | Admitting: Emergency Medicine

## 2013-09-26 ENCOUNTER — Other Ambulatory Visit: Payer: Self-pay

## 2013-09-26 ENCOUNTER — Emergency Department (HOSPITAL_COMMUNITY): Payer: Medicare Other

## 2013-09-26 ENCOUNTER — Encounter: Payer: Self-pay | Admitting: *Deleted

## 2013-09-26 ENCOUNTER — Telehealth: Payer: Self-pay | Admitting: *Deleted

## 2013-09-26 ENCOUNTER — Encounter: Payer: Self-pay | Admitting: Radiation Oncology

## 2013-09-26 ENCOUNTER — Encounter (HOSPITAL_COMMUNITY): Payer: Self-pay | Admitting: Emergency Medicine

## 2013-09-26 VITALS — BP 133/71 | HR 106 | Temp 97.4°F | Resp 20 | Wt 239.7 lb

## 2013-09-26 DIAGNOSIS — I4891 Unspecified atrial fibrillation: Secondary | ICD-10-CM | POA: Insufficient documentation

## 2013-09-26 DIAGNOSIS — H409 Unspecified glaucoma: Secondary | ICD-10-CM | POA: Diagnosis not present

## 2013-09-26 DIAGNOSIS — Y9289 Other specified places as the place of occurrence of the external cause: Secondary | ICD-10-CM | POA: Diagnosis not present

## 2013-09-26 DIAGNOSIS — E1149 Type 2 diabetes mellitus with other diabetic neurological complication: Secondary | ICD-10-CM | POA: Diagnosis not present

## 2013-09-26 DIAGNOSIS — Z9181 History of falling: Secondary | ICD-10-CM

## 2013-09-26 DIAGNOSIS — C61 Malignant neoplasm of prostate: Secondary | ICD-10-CM | POA: Diagnosis not present

## 2013-09-26 DIAGNOSIS — E1142 Type 2 diabetes mellitus with diabetic polyneuropathy: Secondary | ICD-10-CM | POA: Diagnosis not present

## 2013-09-26 DIAGNOSIS — R296 Repeated falls: Secondary | ICD-10-CM | POA: Diagnosis not present

## 2013-09-26 DIAGNOSIS — Z794 Long term (current) use of insulin: Secondary | ICD-10-CM | POA: Insufficient documentation

## 2013-09-26 DIAGNOSIS — S199XXA Unspecified injury of neck, initial encounter: Secondary | ICD-10-CM | POA: Diagnosis not present

## 2013-09-26 DIAGNOSIS — Z7901 Long term (current) use of anticoagulants: Secondary | ICD-10-CM | POA: Insufficient documentation

## 2013-09-26 DIAGNOSIS — Y9389 Activity, other specified: Secondary | ICD-10-CM | POA: Diagnosis not present

## 2013-09-26 DIAGNOSIS — W19XXXA Unspecified fall, initial encounter: Secondary | ICD-10-CM

## 2013-09-26 DIAGNOSIS — H269 Unspecified cataract: Secondary | ICD-10-CM | POA: Insufficient documentation

## 2013-09-26 DIAGNOSIS — S0990XA Unspecified injury of head, initial encounter: Secondary | ICD-10-CM

## 2013-09-26 DIAGNOSIS — R42 Dizziness and giddiness: Secondary | ICD-10-CM | POA: Diagnosis not present

## 2013-09-26 DIAGNOSIS — S0993XA Unspecified injury of face, initial encounter: Secondary | ICD-10-CM | POA: Diagnosis not present

## 2013-09-26 DIAGNOSIS — Z510 Encounter for antineoplastic radiation therapy: Secondary | ICD-10-CM | POA: Diagnosis not present

## 2013-09-26 HISTORY — DX: History of falling: Z91.81

## 2013-09-26 LAB — CBC WITH DIFFERENTIAL/PLATELET
Basophils Absolute: 0 10*3/uL (ref 0.0–0.1)
Basophils Relative: 0 % (ref 0–1)
EOS ABS: 0.2 10*3/uL (ref 0.0–0.7)
EOS PCT: 3 % (ref 0–5)
HEMATOCRIT: 42.8 % (ref 39.0–52.0)
Hemoglobin: 14.9 g/dL (ref 13.0–17.0)
LYMPHS ABS: 1.5 10*3/uL (ref 0.7–4.0)
LYMPHS PCT: 21 % (ref 12–46)
MCH: 30.3 pg (ref 26.0–34.0)
MCHC: 34.8 g/dL (ref 30.0–36.0)
MCV: 87.2 fL (ref 78.0–100.0)
MONO ABS: 0.4 10*3/uL (ref 0.1–1.0)
Monocytes Relative: 6 % (ref 3–12)
Neutro Abs: 4.9 10*3/uL (ref 1.7–7.7)
Neutrophils Relative %: 70 % (ref 43–77)
PLATELETS: 220 10*3/uL (ref 150–400)
RBC: 4.91 MIL/uL (ref 4.22–5.81)
RDW: 13.2 % (ref 11.5–15.5)
WBC: 7 10*3/uL (ref 4.0–10.5)

## 2013-09-26 LAB — BASIC METABOLIC PANEL
Anion gap: 12 (ref 5–15)
BUN: 20 mg/dL (ref 6–23)
CALCIUM: 9.4 mg/dL (ref 8.4–10.5)
CO2: 24 meq/L (ref 19–32)
CREATININE: 1.01 mg/dL (ref 0.50–1.35)
Chloride: 101 mEq/L (ref 96–112)
GFR calc Af Amer: 80 mL/min — ABNORMAL LOW (ref >90)
GFR, EST NON AFRICAN AMERICAN: 69 mL/min — AB (ref >90)
GLUCOSE: 163 mg/dL — AB (ref 70–99)
Potassium: 5.2 mEq/L (ref 3.7–5.3)
SODIUM: 137 meq/L (ref 137–147)

## 2013-09-26 LAB — PROTIME-INR
INR: 2.02 — AB (ref 0.00–1.49)
Prothrombin Time: 22.9 seconds — ABNORMAL HIGH (ref 11.6–15.2)

## 2013-09-26 LAB — CBG MONITORING, ED: Glucose-Capillary: 197 mg/dL — ABNORMAL HIGH (ref 70–99)

## 2013-09-26 LAB — I-STAT TROPONIN, ED: Troponin i, poc: 0.01 ng/mL (ref 0.00–0.08)

## 2013-09-26 MED ORDER — SODIUM CHLORIDE 0.9 % IV BOLUS (SEPSIS)
1000.0000 mL | Freq: Once | INTRAVENOUS | Status: AC
  Administered 2013-09-26: 1000 mL via INTRAVENOUS

## 2013-09-26 NOTE — Progress Notes (Addendum)
Patient denies pain, loss of appetite, bowel issues. He states he is only getting once at night to void now and feels he is emptying his bladder. He denies other bladder issues. Taking Flomax nightly. He is fatigued but states he was fatigued somewhat prior to beginning treatments. He continues to walk with cane stating his legs are not weak, but the cane helps him stay balanced.

## 2013-09-26 NOTE — ED Notes (Signed)
Cancer center nurse added that pt did his his head when he fell. Questionable LOC

## 2013-09-26 NOTE — Progress Notes (Signed)
Approximately at 1255 called RT.  Message left on RT nurse voicemail to notify staff of patient's fall.

## 2013-09-26 NOTE — ED Notes (Signed)
Called lab to inquire about Pt-INR, they stated they didn't receive label, but will start processing now.

## 2013-09-26 NOTE — ED Notes (Signed)
Pt aware that urine sample is needed. Urinal at bedside 

## 2013-09-26 NOTE — ED Notes (Signed)
Bed: CY81 Expected date:  Expected time:  Means of arrival:  Comments: Fall from cancer center

## 2013-09-26 NOTE — ED Notes (Addendum)
Per Cancer center nurse: Pt coming from cancer center where he was having radiation therapy today. Pt was discharged, leaving cancer center when he got dizzy and fell. Pt reports dizziness is not unusual for him. Post fall V/S: 143/79, 97%on RA, pt has skin tear on right wrist.

## 2013-09-26 NOTE — Discharge Instructions (Signed)
Orthostatic Hypotension Orthostatic hypotension is a sudden drop in blood pressure. It happens when you quickly stand up from a seated or lying position. You may feel dizzy or light-headed. This can last for just a few seconds or for up to a few minutes. It is usually not a serious problem. However, if this happens frequently or gets worse, it can be a sign of something more serious. CAUSES  Different things can cause orthostatic hypotension, including:   Loss of body fluids (dehydration).  Medicines that lower blood pressure.  Sudden changes in posture, such as standing up quickly after you have been sitting or lying down.  Taking too much of your medicine. SIGNS AND SYMPTOMS   Light-headedness or dizziness.   Fainting or near-fainting.   A fast heart rate.   Weakness.   Feeling tired (fatigue).  DIAGNOSIS  Your health care provider may do several things to help diagnose your condition and identify the cause. These may include:   Taking a medical history and doing a physical exam.  Checking your blood pressure. Your health care provider will check your blood pressure when you are:  Lying down.  Sitting.  Standing.  Using tilt table testing. In this test, you lie down on a table that moves from a lying position to a standing position. You will be strapped onto the table. This test monitors your blood pressure and heart rate when you are in different positions. TREATMENT  Treatment will vary depending on the cause. Possible treatments include:   Changing the dosage of your medicines.  Wearing compression stockings on your lower legs.  Standing up slowly after sitting or lying down.  Eating more salt.  Eating frequent, small meals.  In some cases, getting IV fluids.  Taking medicine to enhance fluid retention. HOME CARE INSTRUCTIONS  Only take over-the-counter or prescription medicines as directed by your health care provider.  Follow your health care  provider's instructions for changing the dosage of your current medicines.  Do not stop or adjust your medicine on your own.  Stand up slowly after sitting or lying down. This allows your body to adjust to the different position.  Wear compression stockings as directed.  Eat extra salt as directed.  Do not add extra salt to your diet unless directed to by your health care provider.  Eat frequent, small meals.  Avoid standing suddenly after eating.  Avoid hot showers or excessive heat as directed by your health care provider.  Keep all follow-up appointments. SEEK MEDICAL CARE IF:  You continue to feel dizzy or light-headed after standing.  You feel groggy or confused.  You feel cold, clammy, or sick to your stomach (nauseous).  You have blurred vision.  You feel short of breath. SEEK IMMEDIATE MEDICAL CARE IF:   You faint after standing.  You have chest pain.  You have difficulty breathing.   You lose feeling or movement in your arms or legs.   You have slurred speech or difficulty talking, or you are unable to talk.  MAKE SURE YOU:   Understand these instructions.  Will watch your condition.  Will get help right away if you are not doing well or get worse. Document Released: 01/24/2002 Document Revised: 02/08/2013 Document Reviewed: 11/26/2012 [facility] Patient Information 2015 [place], [place]. This information is not intended to replace advice given to you by your health care provider. Make sure you discuss any questions you have with your health care provider.    Dizziness Dizziness is a common  problem. It is a feeling of unsteadiness or light-headedness. You may feel like you are about to faint. Dizziness can lead to injury if you stumble or fall. A person of any age group can suffer from dizziness, but dizziness is more common in older adults. CAUSES  Dizziness can be caused by many different things, including:  Middle ear problems.  Standing for  too long.  Infections.  An allergic reaction.  Aging.  An emotional response to something, such as the sight of blood.  Side effects of medicines.  Tiredness.  Problems with circulation or blood pressure.  Excessive use of alcohol or medicines, or illegal drug use.  Breathing too fast (hyperventilation).  An irregular heart rhythm (arrhythmia).  A low red blood cell count (anemia).  Pregnancy.  Vomiting, diarrhea, fever, or other illnesses that cause body fluid loss (dehydration).  Diseases or conditions such as Parkinson's disease, high blood pressure (hypertension), diabetes, and thyroid problems.  Exposure to extreme heat. DIAGNOSIS  Your health care provider will ask about your symptoms, perform a physical exam, and perform an electrocardiogram (ECG) to record the electrical activity of your heart. Your health care provider may also perform other heart or blood tests to determine the cause of your dizziness. These may include:  Transthoracic echocardiogram (TTE). During echocardiography, sound waves are used to evaluate how blood flows through your heart.  Transesophageal echocardiogram (TEE).  Cardiac monitoring. This allows your health care provider to monitor your heart rate and rhythm in real time.  Holter monitor. This is a portable device that records your heartbeat and can help diagnose heart arrhythmias. It allows your health care provider to track your heart activity for several days if needed.  Stress tests by exercise or by giving medicine that makes the heart beat faster. TREATMENT  Treatment of dizziness depends on the cause of your symptoms and can vary greatly. HOME CARE INSTRUCTIONS   Drink enough fluids to keep your urine clear or pale yellow. This is especially important in very hot weather. In older adults, it is also important in cold weather.  Take your medicine exactly as directed if your dizziness is caused by medicines. When taking blood  pressure medicines, it is especially important to get up slowly.  Rise slowly from chairs and steady yourself until you feel okay.  In the morning, first sit up on the side of the bed. When you feel okay, stand slowly while holding onto something until you know your balance is fine.  Move your legs often if you need to stand in one place for a long time. Tighten and relax your muscles in your legs while standing.  Have someone stay with you for 1-2 days if dizziness continues to be a problem. Do this until you feel you are well enough to stay alone. Have the person call your health care provider if he or she notices changes in you that are concerning.  Do not drive or use heavy machinery if you feel dizzy.  Do not drink alcohol. SEEK IMMEDIATE MEDICAL CARE IF:   Your dizziness or light-headedness gets worse.  You feel nauseous or vomit.  You have problems talking, walking, or using your arms, hands, or legs.  You feel weak.  You are not thinking clearly or you have trouble forming sentences. It may take a friend or family member to notice this.  You have chest pain, abdominal pain, shortness of breath, or sweating.  Your vision changes.  You notice any bleeding.  You  have side effects from medicine that seems to be getting worse rather than better. MAKE SURE YOU:   Understand these instructions.  Will watch your condition.  Will get help right away if you are not doing well or get worse. Document Released: 07/30/2000 Document Revised: 02/08/2013 Document Reviewed: 08/23/2010 [facility] Patient Information 2015 [place], [place]. This information is not intended to replace advice given to you by your health care provider. Make sure you discuss any questions you have with your health care provider.  Head Injury You have received a head injury. It does not appear serious at this time. Headaches and vomiting are common following head injury. It should be easy to awaken from  sleeping. Sometimes it is necessary for you to stay in the emergency department for a while for observation. Sometimes admission to the hospital may be needed. After injuries such as yours, most problems occur within the first 24 hours, but side effects may occur up to 7-10 days after the injury. It is important for you to carefully monitor your condition and contact your health care provider or seek immediate medical care if there is a change in your condition. WHAT ARE THE TYPES OF HEAD INJURIES? Head injuries can be as minor as a bump. Some head injuries can be more severe. More severe head injuries include:  A jarring injury to the brain (concussion).  A bruise of the brain (contusion). This mean there is bleeding in the brain that can cause swelling.  A cracked skull (skull fracture).  Bleeding in the brain that collects, clots, and forms a bump (hematoma). WHAT CAUSES A HEAD INJURY? A serious head injury is most likely to happen to someone who is in a car wreck and is not wearing a seat belt. Other causes of major head injuries include bicycle or motorcycle accidents, sports injuries, and falls. HOW ARE HEAD INJURIES DIAGNOSED? A complete history of the event leading to the injury and your current symptoms will be helpful in diagnosing head injuries. Many times, pictures of the brain, such as CT or MRI are needed to see the extent of the injury. Often, an overnight hospital stay is necessary for observation.  WHEN SHOULD I SEEK IMMEDIATE MEDICAL CARE?  You should get help right away if:  You have confusion or drowsiness.  You feel sick to your stomach (nauseous) or have continued, forceful vomiting.  You have dizziness or unsteadiness that is getting worse.  You have severe, continued headaches not relieved by medicine. Only take over-the-counter or prescription medicines for pain, fever, or discomfort as directed by your health care provider.  You do not have normal function of the  arms or legs or are unable to walk.  You notice changes in the black spots in the center of the colored part of your eye (pupil).  You have a clear or bloody fluid coming from your nose or ears.  You have a loss of vision. During the next 24 hours after the injury, you must stay with someone who can watch you for the warning signs. This person should contact local emergency services (911 in the [place] if you have seizures, you become unconscious, or you are unable to wake up. HOW CAN I PREVENT A HEAD INJURY IN THE FUTURE? The most important factor for preventing major head injuries is avoiding motor vehicle accidents. To minimize the potential for damage to your head, it is crucial to wear seat belts while riding in motor vehicles. Wearing helmets while bike riding and  playing collision sports (like football) is also helpful. Also, avoiding dangerous activities around the house will further help reduce your risk of head injury.  WHEN CAN I RETURN TO NORMAL ACTIVITIES AND ATHLETICS? You should be reevaluated by your health care provider before returning to these activities. If you have any of the following symptoms, you should not return to activities or contact sports until 1 week after the symptoms have stopped:  Persistent headache.  Dizziness or vertigo.  Poor attention and concentration.  Confusion.  Memory problems.  Nausea or vomiting.  Fatigue or tire easily.  Irritability.  Intolerant of bright lights or loud noises.  Anxiety or depression.  Disturbed sleep. MAKE SURE YOU:   Understand these instructions.  Will watch your condition.  Will get help right away if you are not doing well or get worse. Document Released: 02/03/2005 Document Revised: 02/08/2013 Document Reviewed: 10/11/2012 [facility] Patient Information 2015 [place], [place]. This information is not intended to replace advice given to you by your health care provider. Make sure you discuss any questions you  have with your health care provider.

## 2013-09-26 NOTE — Progress Notes (Signed)
   Weekly Management Note:  outpatient    ICD-9-CM  1. Prostate cancer 185    Current Dose:  58 Gy  Projected Dose: 76 Gy   Narrative:  The patient presents for routine under treatment assessment.  CBCT/MVCT images/Port film x-rays were reviewed.  The chart was checked. Flomax helping GU sx. persistent balance issues. Diabetic neuropathy suspected to play role in this. Pt never heard from home health for PT/OT since my referral  Physical Findings:  weight is 239 lb 11.2 oz (108.727 kg). His oral temperature is 97.4 F (36.3 C). His blood pressure is 133/71 and his pulse is 106. His respiration is 20.  NAD  Impression:  The patient is tolerating radiotherapy.  Plan:  Continue radiotherapy as planned. Hollie Salk will check back in re: PT/OT referral w Home Health.  ________________________________   Eppie Gibson, M.D.

## 2013-09-26 NOTE — Telephone Encounter (Signed)
Attempted to reach pt on both listed numbers to request he come early for treatment today. He will see Dr Isidore Moos for weekly put but needs to see dr prior to treatment instead of following. Unable to reach pt; left vm requesting call back.

## 2013-09-26 NOTE — Progress Notes (Signed)
Front greeter [place] and [place] with [facility] Notified Triage of a patient fall about 12:20 pm.  To Valet area of [place] to find Charles Friedman lying supine  On pavement with towels under head and cane strapped to right wrist.  Reports he was walking to his car after Radiation treatment and "It was too far to walk, I lost my balance and hit the pavement".  Denies hitting anything showing his right arm which has a two inch skin tear at wrist and a dime-size skin tear at rt. Elbow.  Tried to show his right calf but only an old healing scar noted here.  States he is "more embarrassed than anything else" and HR increased from 97 to 103.  Rapid response nurse arrived asking how hard he hit his head and he says "not hard at all".  No injury or skin tears noted to head.  This nurse left patient lying on ground awaiting stretcher for total lift.  Stretcher arrived.  Rapid response nurse asked patient to sit up which he did and sat to get his bearings.  Reports he was dizzy before he fell.  This nurse observed him be assisted to standing.  "I am not going to be able to help you" was his response.  He was then lifted to stretcher and to the ER.  Asked if he wanted any friend or family member called and he denies need to call anyone.  Noted he is a widow.

## 2013-09-26 NOTE — ED Provider Notes (Signed)
TIME SEEN: 1:05 PM  CHIEF COMPLAINT: Dizziness, fall, head injury  HPI: Pt is a 80 y.o.  Male with history of insulin-dependent diabetes, atrial fibrillation on Coumadin, prostate cancer currently receiving radiation who presents to the emergency department after he had a fall outside of the [facility] today. Patient was there having radiation therapy today. He states that over the past month he has had "unsteadiness" with walking. He does not describe this as a lightheadedness or vertigo. He states he is not sure if he hit his head but witnesses state that he did. He denies any current pain. No chest pain, shortness of breath, palpitations that led to his fall. No recent fevers, cough, vomiting or diarrhea. No bloody stool or melena. No dysuria or hematuria. Denies any numbness, tingling or focal weakness.  ROS: See HPI Constitutional: no fever  Eyes: no drainage  ENT: no runny nose   Cardiovascular:  no chest pain  Resp: no SOB  GI: no vomiting GU: no dysuria Integumentary: no rash  Allergy: no hives  Musculoskeletal: no leg swelling  Neurological: no slurred speech ROS otherwise negative  PAST MEDICAL HISTORY/PAST SURGICAL HISTORY:  Past Medical History  Diagnosis Date  . Cataract     no surgery yet  . Diabetes mellitus without complication     type II  . Glaucoma     left eye  . Atrial fibrillation   . Neuropathy     diabetic, in feet  . Cancer     prostate cancer    MEDICATIONS:  Prior to Admission medications   Medication Sig Start Date End Date Taking? Authorizing Provider  glyBURIDE-metformin (GLUCOVANCE) 2.5-500 MG per tablet Take 1 tablet by mouth 2 (two) times daily with a meal.     Historical Provider, MD  insulin glargine (LANTUS) 100 UNIT/ML injection Inject 100 Units into the skin at bedtime.    Historical Provider, MD  insulin NPH (HUMULIN N,NOVOLIN N) 100 UNIT/ML injection Inject into the skin. If needed after checking blood sugar,sliding scale     Historical Provider, MD  latanoprost (XALATAN) 0.005 % ophthalmic solution Place 1 drop into both eyes at bedtime.     Historical Provider, MD  pravastatin (PRAVACHOL) 10 MG tablet Take 10 mg by mouth daily.    Historical Provider, MD  tamsulosin (FLOMAX) 0.4 MG CAPS capsule Take 1 capsule (0.4 mg total) by mouth daily. 08/22/13   Rexene Edison, MD  valsartan (DIOVAN) 80 MG tablet Take 80 mg by mouth daily.    Historical Provider, MD  vardenafil (LEVITRA) 20 MG tablet Take 20 mg by mouth daily as needed.    Historical Provider, MD  warfarin (COUMADIN) 5 MG tablet Take 5 mg by mouth daily. Hx A-Fib, per coumadin pharmacy protocol    Historical Provider, MD    ALLERGIES:  No Known Allergies  SOCIAL HISTORY:  History  Substance Use Topics  . Smoking status: Never Smoker   . Smokeless tobacco: Not on file  . Alcohol Use: No    FAMILY HISTORY: Family History  Problem Relation Age of Onset  . Cancer Sister 73    brain tumor died at age 52    EXAM: BP 147/90  Pulse 112  Temp(Src) 98.4 F (36.9 C) (Oral)  Resp 16  SpO2 99% CONSTITUTIONAL: Alert and oriented and responds appropriately to questions. Well-appearing; well-nourished HEAD: Normocephalic EYES: Conjunctivae clear, PERRL ENT: normal nose; no rhinorrhea; moist mucous membranes; pharynx without lesions noted NECK: Supple, no meningismus, no LAD  CARD: irregularly irregular; S1 and S2 appreciated; no murmurs, no clicks, no rubs, no gallops RESP: Normal chest excursion without splinting or tachypnea; breath sounds clear and equal bilaterally; no wheezes, no rhonchi, no rales,  ABD/GI: Normal bowel sounds; non-distended; soft, non-tender, no rebound, no guarding BACK:  The back appears normal and is non-tender to palpation, there is no CVA tenderness EXT: Normal ROM in all joints; non-tender to palpation; no edema; normal capillary refill; no cyanosis    SKIN: Normal color for age and race; warm NEURO: Moves all extremities  equally, sensation to light touch intact diffusely, cranial nerves II through XII intact, ambulate with a cane PSYCH: The patient's mood and manner are appropriate. Grooming and personal hygiene are appropriate.  MEDICAL DECISION MAKING: Patient here with "unsteadiness" with walking. This is been present for the past month. No other neurologic complaints. He is neurologically intact. Hemodynamically stable. Concern for possible anemia, electrolyte normality, ACS, infection. We'll obtain labs, urine. Will obtain head and cervical spine CT. We'll give IV fluids as patient's heart rate is tender to the 120s with standing although no hypotension.  ED PROGRESS: Patient's labs are unremarkable. His INR is therapeutic. Troponin negative. CT of his head and cervical spine show no acute abnormality. He has received IV fluids and states he is ready for discharge. When I ask him for a urine sample in to test his gait, he declined stating that he is ready for discharge home and he does not feel that this workup is necessary. He states that he has a primary care physician followup in 2 days with Dr. Osborne Casco.  Patient thinks that his unsteadiness is "all in my head". When I attempted to explain to him that he may have some hydration and this may also because by the Flomax that he recently started, he does not accept these explanations. He refuses any further intervention and would like discharge home. I feel he is competent to make this decision. Discussed supportive care instructions and return precautions. He verbalizes understanding and is comfortable plan.     Date: 09/26/2013 12:48 PM  Rate: 103  Rhythm: Atrial fibrillation  QRS Axis: normal  Intervals: normal  ST/T Wave abnormalities: normal  Conduction Disutrbances: none  Narrative Interpretation: Atrial fibrillation      [place], DO 09/26/13 [id number]

## 2013-09-26 NOTE — ED Notes (Signed)
Patient transported to CT 

## 2013-09-27 ENCOUNTER — Encounter: Payer: Self-pay | Admitting: *Deleted

## 2013-09-27 ENCOUNTER — Ambulatory Visit
Admission: RE | Admit: 2013-09-27 | Discharge: 2013-09-27 | Disposition: A | Discharge status: Home / Self Care | Payer: Medicare Other | Source: Ambulatory Visit | Attending: Radiation Oncology | Admitting: Radiation Oncology

## 2013-09-27 DIAGNOSIS — E1142 Type 2 diabetes mellitus with diabetic polyneuropathy: Secondary | ICD-10-CM | POA: Diagnosis not present

## 2013-09-27 DIAGNOSIS — C61 Malignant neoplasm of prostate: Secondary | ICD-10-CM | POA: Diagnosis not present

## 2013-09-27 DIAGNOSIS — E1149 Type 2 diabetes mellitus with other diabetic neurological complication: Secondary | ICD-10-CM | POA: Diagnosis not present

## 2013-09-27 DIAGNOSIS — Z510 Encounter for antineoplastic radiation therapy: Secondary | ICD-10-CM | POA: Diagnosis not present

## 2013-09-28 ENCOUNTER — Ambulatory Visit
Admission: RE | Admit: 2013-09-28 | Discharge: 2013-09-28 | Disposition: A | Discharge status: Home / Self Care | Payer: Medicare Other | Source: Ambulatory Visit | Attending: Radiation Oncology | Admitting: Radiation Oncology

## 2013-09-28 DIAGNOSIS — E1149 Type 2 diabetes mellitus with other diabetic neurological complication: Secondary | ICD-10-CM | POA: Diagnosis not present

## 2013-09-28 DIAGNOSIS — Z510 Encounter for antineoplastic radiation therapy: Secondary | ICD-10-CM | POA: Diagnosis not present

## 2013-09-28 DIAGNOSIS — C61 Malignant neoplasm of prostate: Secondary | ICD-10-CM | POA: Diagnosis not present

## 2013-09-28 DIAGNOSIS — E1142 Type 2 diabetes mellitus with diabetic polyneuropathy: Secondary | ICD-10-CM | POA: Diagnosis not present

## 2013-09-29 ENCOUNTER — Ambulatory Visit
Admission: RE | Admit: 2013-09-29 | Discharge: 2013-09-29 | Disposition: A | Discharge status: Home / Self Care | Payer: Medicare Other | Source: Ambulatory Visit | Attending: Radiation Oncology | Admitting: Radiation Oncology

## 2013-09-29 DIAGNOSIS — E1149 Type 2 diabetes mellitus with other diabetic neurological complication: Secondary | ICD-10-CM | POA: Diagnosis not present

## 2013-09-29 DIAGNOSIS — E1142 Type 2 diabetes mellitus with diabetic polyneuropathy: Secondary | ICD-10-CM | POA: Diagnosis not present

## 2013-09-29 DIAGNOSIS — Z510 Encounter for antineoplastic radiation therapy: Secondary | ICD-10-CM | POA: Diagnosis not present

## 2013-09-29 DIAGNOSIS — C61 Malignant neoplasm of prostate: Secondary | ICD-10-CM | POA: Diagnosis not present

## 2013-09-30 ENCOUNTER — Ambulatory Visit
Admission: RE | Admit: 2013-09-30 | Discharge: 2013-09-30 | Disposition: A | Discharge status: Home / Self Care | Payer: Medicare Other | Source: Ambulatory Visit | Attending: Radiation Oncology | Admitting: Radiation Oncology

## 2013-09-30 DIAGNOSIS — E1149 Type 2 diabetes mellitus with other diabetic neurological complication: Secondary | ICD-10-CM | POA: Diagnosis not present

## 2013-09-30 DIAGNOSIS — E1142 Type 2 diabetes mellitus with diabetic polyneuropathy: Secondary | ICD-10-CM | POA: Diagnosis not present

## 2013-09-30 DIAGNOSIS — C61 Malignant neoplasm of prostate: Secondary | ICD-10-CM | POA: Diagnosis not present

## 2013-09-30 DIAGNOSIS — Z510 Encounter for antineoplastic radiation therapy: Secondary | ICD-10-CM | POA: Diagnosis not present

## 2013-10-03 ENCOUNTER — Ambulatory Visit
Admission: RE | Admit: 2013-10-03 | Discharge: 2013-10-03 | Disposition: A | Discharge status: Home / Self Care | Payer: Medicare Other | Source: Ambulatory Visit | Attending: Radiation Oncology | Admitting: Radiation Oncology

## 2013-10-03 VITALS — BP 137/80 | HR 107 | Temp 97.7°F | Ht 73.0 in | Wt 244.5 lb

## 2013-10-03 DIAGNOSIS — Z510 Encounter for antineoplastic radiation therapy: Secondary | ICD-10-CM | POA: Diagnosis not present

## 2013-10-03 DIAGNOSIS — E1149 Type 2 diabetes mellitus with other diabetic neurological complication: Secondary | ICD-10-CM | POA: Diagnosis not present

## 2013-10-03 DIAGNOSIS — E1142 Type 2 diabetes mellitus with diabetic polyneuropathy: Secondary | ICD-10-CM | POA: Diagnosis not present

## 2013-10-03 DIAGNOSIS — C61 Malignant neoplasm of prostate: Secondary | ICD-10-CM | POA: Diagnosis not present

## 2013-10-03 NOTE — Progress Notes (Signed)
Weekly Management Note:  Site:Prostate Current Dose:  7000  cGy Projected Dose: 7600  cGy  Narrative: The patient is seen today for routine under treatment assessment. CBCT/MVCT images/port films were reviewed. The chart was reviewed.   Bladder filling is satisfactory. No new GU or GI difficulty. Nocturia x 2.He had a fall last week secondary to loss of balance. He denies lightheadedness or LOC. He has had diabetic neuropathy for 7-8 years but has had more difficulty over the past few week. He missed appointment with Dr. Osborne Casco because of fall. He is making new appointment ASAP. He is now in a wheelchair.  Physical Examination:  Filed Vitals:   10/03/13 1154  BP: 137/80  Pulse: 107  Temp: 97.7 F (36.5 C)  .  Weight: 244 lb 8 oz (110.904 kg). No change.  Impression: Tolerating radiation therapy well. I am unsure as to the etiology of his balance problems. He may have had a an ischemic event. I do not think his balance problem is from othostatic hypotension from Flomax. He is to see Dr. Osborne Casco  as soon as possible.  Plan: Continue radiation therapy as planned. He will finish his XRT this Thursday.

## 2013-10-03 NOTE — Progress Notes (Signed)
Charles Friedman has completed 25/38 fractions to his prostate.  He is in a wheel chair today due to a fall last week. He said he scratched is right wrist only in the fall.  He reports that his balance is "not good" right now.  He is taking flomax.  He denies pain, dysuria, skin irritation. and hematuria.  He has noticed an increase in nocturia - he is getting up 2 times per night.  He reports fatigue.  His heart rate is elevated today at 107.

## 2013-10-04 ENCOUNTER — Ambulatory Visit
Admission: RE | Admit: 2013-10-04 | Discharge: 2013-10-04 | Disposition: A | Discharge status: Home / Self Care | Payer: Medicare Other | Source: Ambulatory Visit | Attending: Radiation Oncology | Admitting: Radiation Oncology

## 2013-10-04 DIAGNOSIS — Z510 Encounter for antineoplastic radiation therapy: Secondary | ICD-10-CM | POA: Diagnosis not present

## 2013-10-04 DIAGNOSIS — E1142 Type 2 diabetes mellitus with diabetic polyneuropathy: Secondary | ICD-10-CM | POA: Diagnosis not present

## 2013-10-04 DIAGNOSIS — E1149 Type 2 diabetes mellitus with other diabetic neurological complication: Secondary | ICD-10-CM | POA: Diagnosis not present

## 2013-10-04 DIAGNOSIS — C61 Malignant neoplasm of prostate: Secondary | ICD-10-CM | POA: Diagnosis not present

## 2013-10-05 ENCOUNTER — Ambulatory Visit
Admission: RE | Admit: 2013-10-05 | Discharge: 2013-10-05 | Disposition: A | Discharge status: Home / Self Care | Payer: Medicare Other | Source: Ambulatory Visit | Attending: Radiation Oncology | Admitting: Radiation Oncology

## 2013-10-05 DIAGNOSIS — E1149 Type 2 diabetes mellitus with other diabetic neurological complication: Secondary | ICD-10-CM | POA: Diagnosis not present

## 2013-10-05 DIAGNOSIS — C61 Malignant neoplasm of prostate: Secondary | ICD-10-CM | POA: Diagnosis not present

## 2013-10-05 DIAGNOSIS — E1142 Type 2 diabetes mellitus with diabetic polyneuropathy: Secondary | ICD-10-CM | POA: Diagnosis not present

## 2013-10-05 DIAGNOSIS — Z510 Encounter for antineoplastic radiation therapy: Secondary | ICD-10-CM | POA: Diagnosis not present

## 2013-10-06 ENCOUNTER — Ambulatory Visit
Admission: RE | Admit: 2013-10-06 | Discharge: 2013-10-06 | Disposition: A | Discharge status: Home / Self Care | Payer: Medicare Other | Source: Ambulatory Visit | Attending: Radiation Oncology | Admitting: Radiation Oncology

## 2013-10-06 ENCOUNTER — Encounter: Payer: Self-pay | Admitting: Radiation Oncology

## 2013-10-06 VITALS — BP 121/71 | HR 106 | Temp 97.6°F | Resp 20 | Wt 241.1 lb

## 2013-10-06 DIAGNOSIS — Z510 Encounter for antineoplastic radiation therapy: Secondary | ICD-10-CM | POA: Diagnosis not present

## 2013-10-06 DIAGNOSIS — C61 Malignant neoplasm of prostate: Secondary | ICD-10-CM

## 2013-10-06 DIAGNOSIS — E1142 Type 2 diabetes mellitus with diabetic polyneuropathy: Secondary | ICD-10-CM | POA: Diagnosis not present

## 2013-10-06 DIAGNOSIS — E1149 Type 2 diabetes mellitus with other diabetic neurological complication: Secondary | ICD-10-CM | POA: Diagnosis not present

## 2013-10-06 NOTE — Progress Notes (Signed)
[facility] End of Treatment Note  Name:Mantaj BHARGAV BARBARO  Date: 10/06/2013 HMC:[id number] DOB:1932-05-29   Status:outpatient    CC:   Sharyn Creamer, MD, Dr. Phebe Colla, Dr. Domenick Gong   REFERRING PHYSICIAN: Sharyn Creamer, MD      DIAGNOSIS: Stage TI C. favorable risk adenocarcinoma prostate    INDICATION FOR TREATMENT: Curative   TREATMENT DATES: 08/15/2013 through 10/06/2013                          SITE/DOSE: Prostate 7600 cGy in 38 sessions                           BEAMS/ENERGY: 6 MV photons, dual ARC VMAT  IMRT                  NARRATIVE:  Mr. Strite tolerated his radiation therapy recently well from a GU standpoint although I started him on Flomax for obstructive symptoms early during his course of therapy. He had a number of falls during his treatment which was felt  to be secondary to his peripheral neuropathy rather than lightheadedness or orthostatic hypotension.  He is to be evaluated by Dr. Osborne Casco.                PLAN: Routine followup in one month. Patient instructed to call if questions or worsening complaints in interim.

## 2013-10-06 NOTE — Progress Notes (Signed)
Chart note: On 08/15/2013 Charles Friedman began his VMAT radiation therapy in the management of his carcinoma the prostate. He was treated with 2 dynamic MLCs corresponding to one set of IMRT treatment devices [phone]).

## 2013-10-06 NOTE — Progress Notes (Signed)
EOT Prostate, 38/38 , increased frequency voiding,. No dysuria, no hesitancy, good stream, nocturia x 3 regular bm's, appetite good, energy level  Poor, in w/c, 1 month f/u card appt already has 11:35 AM   11:35 AM

## 2013-10-06 NOTE — Progress Notes (Signed)
Weekly Management Note:  Site: Prostate Current Dose:  7600  cGy Projected Dose: 7600  cGy  Narrative: The patient is seen today for routine under treatment assessment. CBCT/MVCT images/port films were reviewed. The chart was reviewed.   Bladder filling is satisfactory. No new GU or GI difficulty. He continues to have increased urinary frequency. He finished his Flomax prescription, and I told him that he should probably take it for one further month. Radiation therapy completed today.  Physical Examination:  Filed Vitals:   10/06/13 1133  BP: 121/71  Pulse: 106  Temp: 97.6 F (36.4 C)  Resp: 20  .  Weight: 241 lb 1.6 oz (109.362 kg). No change.  Impression: Radiation therapy reasonably well tolerated.  Plan: Followup visit in one month.

## 2013-10-07 DIAGNOSIS — H409 Unspecified glaucoma: Secondary | ICD-10-CM | POA: Diagnosis not present

## 2013-10-07 DIAGNOSIS — Z1331 Encounter for screening for depression: Secondary | ICD-10-CM | POA: Diagnosis not present

## 2013-10-07 DIAGNOSIS — E785 Hyperlipidemia, unspecified: Secondary | ICD-10-CM | POA: Diagnosis not present

## 2013-10-07 DIAGNOSIS — E1165 Type 2 diabetes mellitus with hyperglycemia: Secondary | ICD-10-CM | POA: Diagnosis not present

## 2013-10-07 DIAGNOSIS — I10 Essential (primary) hypertension: Secondary | ICD-10-CM | POA: Diagnosis not present

## 2013-10-07 DIAGNOSIS — N529 Male erectile dysfunction, unspecified: Secondary | ICD-10-CM | POA: Diagnosis not present

## 2013-10-07 DIAGNOSIS — I4891 Unspecified atrial fibrillation: Secondary | ICD-10-CM | POA: Diagnosis not present

## 2013-10-07 DIAGNOSIS — E1129 Type 2 diabetes mellitus with other diabetic kidney complication: Secondary | ICD-10-CM | POA: Diagnosis not present

## 2013-10-07 DIAGNOSIS — Z7901 Long term (current) use of anticoagulants: Secondary | ICD-10-CM | POA: Diagnosis not present

## 2013-10-20 DIAGNOSIS — E1129 Type 2 diabetes mellitus with other diabetic kidney complication: Secondary | ICD-10-CM | POA: Diagnosis not present

## 2013-10-20 DIAGNOSIS — I4891 Unspecified atrial fibrillation: Secondary | ICD-10-CM | POA: Diagnosis not present

## 2013-10-20 DIAGNOSIS — Z7901 Long term (current) use of anticoagulants: Secondary | ICD-10-CM | POA: Diagnosis not present

## 2013-10-20 DIAGNOSIS — E1165 Type 2 diabetes mellitus with hyperglycemia: Secondary | ICD-10-CM | POA: Diagnosis not present

## 2013-11-10 ENCOUNTER — Encounter: Payer: Self-pay | Admitting: *Deleted

## 2013-11-15 ENCOUNTER — Encounter: Payer: Self-pay | Admitting: Radiation Oncology

## 2013-11-15 ENCOUNTER — Ambulatory Visit
Admission: RE | Admit: 2013-11-15 | Discharge: 2013-11-15 | Disposition: A | Discharge status: Home / Self Care | Payer: Medicare Other | Source: Ambulatory Visit | Attending: Radiation Oncology | Admitting: Radiation Oncology

## 2013-11-15 VITALS — BP 121/83 | HR 104 | Temp 98.0°F | Resp 20 | Wt 239.0 lb

## 2013-11-15 DIAGNOSIS — C61 Malignant neoplasm of prostate: Secondary | ICD-10-CM

## 2013-11-15 NOTE — Progress Notes (Signed)
CC: Dr. Phebe Colla  Mr. Charles Friedman returns today approximately 5 weeks following completion of external beam/IMRT in the management of his stage TI C. favorable risk adenocarcinoma prostate. He is doing well from a GU and GI standpoint and his return to his baseline habits. He has not yet have an appointment to see Dr. Tresa Moore. He recently saw his primary care physician, Dr.Tisovec on September 3.   Physical examination: Alert and oriented. Filed Vitals:   11/15/13 1042  BP: 121/83  Pulse: 104  Temp: 98 F (36.7 C)  Resp: 20   Rectal examination not performed today.  Impression: Satisfactory progress with return to his baseline urinary and bowel habits.  Plan: I've not scheduled the patient for a formal followup visit, and I expect that Dr. Tresa Moore will see him in 2-3 months for a followup visit and PSA determination. I ask that Dr. Tresa Moore keep me posted on his progress.

## 2013-11-15 NOTE — Progress Notes (Addendum)
Patient denies pain, fatigue, loss of appetite, bowel issues. Patient denies urinary issues as well. He states his leg weakness is somewhat improved. He saw Dr Osborne Casco on 10/20/13 and was instructed to control his blood sugars. He states he walks around his home holding onto furniture.

## 2013-11-17 DIAGNOSIS — L603 Nail dystrophy: Secondary | ICD-10-CM | POA: Diagnosis not present

## 2013-11-17 DIAGNOSIS — E1051 Type 1 diabetes mellitus with diabetic peripheral angiopathy without gangrene: Secondary | ICD-10-CM | POA: Diagnosis not present

## 2013-11-17 DIAGNOSIS — I739 Peripheral vascular disease, unspecified: Secondary | ICD-10-CM | POA: Diagnosis not present

## 2013-12-07 DIAGNOSIS — Z7901 Long term (current) use of anticoagulants: Secondary | ICD-10-CM | POA: Diagnosis not present

## 2013-12-07 DIAGNOSIS — Z23 Encounter for immunization: Secondary | ICD-10-CM | POA: Diagnosis not present

## 2013-12-07 DIAGNOSIS — I480 Paroxysmal atrial fibrillation: Secondary | ICD-10-CM | POA: Diagnosis not present

## 2013-12-12 DIAGNOSIS — C61 Malignant neoplasm of prostate: Secondary | ICD-10-CM | POA: Diagnosis not present

## 2014-01-11 DIAGNOSIS — Z7901 Long term (current) use of anticoagulants: Secondary | ICD-10-CM | POA: Diagnosis not present

## 2014-01-11 DIAGNOSIS — E1121 Type 2 diabetes mellitus with diabetic nephropathy: Secondary | ICD-10-CM | POA: Diagnosis not present

## 2014-01-11 DIAGNOSIS — I480 Paroxysmal atrial fibrillation: Secondary | ICD-10-CM | POA: Diagnosis not present

## 2014-01-18 DIAGNOSIS — I480 Paroxysmal atrial fibrillation: Secondary | ICD-10-CM | POA: Diagnosis not present

## 2014-01-18 DIAGNOSIS — I10 Essential (primary) hypertension: Secondary | ICD-10-CM | POA: Diagnosis not present

## 2014-01-18 DIAGNOSIS — H409 Unspecified glaucoma: Secondary | ICD-10-CM | POA: Diagnosis not present

## 2014-01-18 DIAGNOSIS — E1129 Type 2 diabetes mellitus with other diabetic kidney complication: Secondary | ICD-10-CM | POA: Diagnosis not present

## 2014-01-18 DIAGNOSIS — C61 Malignant neoplasm of prostate: Secondary | ICD-10-CM | POA: Diagnosis not present

## 2014-01-18 DIAGNOSIS — Z6834 Body mass index (BMI) 34.0-34.9, adult: Secondary | ICD-10-CM | POA: Diagnosis not present

## 2014-01-18 DIAGNOSIS — E785 Hyperlipidemia, unspecified: Secondary | ICD-10-CM | POA: Diagnosis not present

## 2014-01-18 DIAGNOSIS — Z7901 Long term (current) use of anticoagulants: Secondary | ICD-10-CM | POA: Diagnosis not present

## 2014-02-02 DIAGNOSIS — I739 Peripheral vascular disease, unspecified: Secondary | ICD-10-CM | POA: Diagnosis not present

## 2014-02-02 DIAGNOSIS — E1051 Type 1 diabetes mellitus with diabetic peripheral angiopathy without gangrene: Secondary | ICD-10-CM | POA: Diagnosis not present

## 2014-02-02 DIAGNOSIS — L603 Nail dystrophy: Secondary | ICD-10-CM | POA: Diagnosis not present

## 2014-02-07 DIAGNOSIS — Z7901 Long term (current) use of anticoagulants: Secondary | ICD-10-CM | POA: Diagnosis not present

## 2014-02-07 DIAGNOSIS — I480 Paroxysmal atrial fibrillation: Secondary | ICD-10-CM | POA: Diagnosis not present

## 2014-03-21 DIAGNOSIS — Z7901 Long term (current) use of anticoagulants: Secondary | ICD-10-CM | POA: Diagnosis not present

## 2014-03-21 DIAGNOSIS — I480 Paroxysmal atrial fibrillation: Secondary | ICD-10-CM | POA: Diagnosis not present

## 2014-03-28 DIAGNOSIS — C61 Malignant neoplasm of prostate: Secondary | ICD-10-CM | POA: Diagnosis not present

## 2014-03-28 DIAGNOSIS — N5201 Erectile dysfunction due to arterial insufficiency: Secondary | ICD-10-CM | POA: Diagnosis not present

## 2014-03-28 DIAGNOSIS — R300 Dysuria: Secondary | ICD-10-CM | POA: Diagnosis not present

## 2014-04-13 DIAGNOSIS — I739 Peripheral vascular disease, unspecified: Secondary | ICD-10-CM | POA: Diagnosis not present

## 2014-04-13 DIAGNOSIS — E1051 Type 1 diabetes mellitus with diabetic peripheral angiopathy without gangrene: Secondary | ICD-10-CM | POA: Diagnosis not present

## 2014-04-13 DIAGNOSIS — L603 Nail dystrophy: Secondary | ICD-10-CM | POA: Diagnosis not present

## 2014-04-25 DIAGNOSIS — I480 Paroxysmal atrial fibrillation: Secondary | ICD-10-CM | POA: Diagnosis not present

## 2014-04-25 DIAGNOSIS — Z7901 Long term (current) use of anticoagulants: Secondary | ICD-10-CM | POA: Diagnosis not present

## 2014-05-02 DIAGNOSIS — H3561 Retinal hemorrhage, right eye: Secondary | ICD-10-CM | POA: Diagnosis not present

## 2014-05-30 DIAGNOSIS — H2512 Age-related nuclear cataract, left eye: Secondary | ICD-10-CM | POA: Diagnosis not present

## 2014-05-30 DIAGNOSIS — H18411 Arcus senilis, right eye: Secondary | ICD-10-CM | POA: Diagnosis not present

## 2014-05-30 DIAGNOSIS — H2511 Age-related nuclear cataract, right eye: Secondary | ICD-10-CM | POA: Diagnosis not present

## 2014-05-30 DIAGNOSIS — H18412 Arcus senilis, left eye: Secondary | ICD-10-CM | POA: Diagnosis not present

## 2014-05-30 DIAGNOSIS — E11329 Type 2 diabetes mellitus with mild nonproliferative diabetic retinopathy without macular edema: Secondary | ICD-10-CM | POA: Diagnosis not present

## 2014-05-30 DIAGNOSIS — H02839 Dermatochalasis of unspecified eye, unspecified eyelid: Secondary | ICD-10-CM | POA: Diagnosis not present

## 2014-05-31 DIAGNOSIS — Z7901 Long term (current) use of anticoagulants: Secondary | ICD-10-CM | POA: Diagnosis not present

## 2014-05-31 DIAGNOSIS — I480 Paroxysmal atrial fibrillation: Secondary | ICD-10-CM | POA: Diagnosis not present

## 2014-06-08 DIAGNOSIS — C61 Malignant neoplasm of prostate: Secondary | ICD-10-CM | POA: Diagnosis not present

## 2014-06-15 DIAGNOSIS — N5201 Erectile dysfunction due to arterial insufficiency: Secondary | ICD-10-CM | POA: Diagnosis not present

## 2014-06-15 DIAGNOSIS — R3915 Urgency of urination: Secondary | ICD-10-CM | POA: Diagnosis not present

## 2014-06-15 DIAGNOSIS — C61 Malignant neoplasm of prostate: Secondary | ICD-10-CM | POA: Diagnosis not present

## 2014-06-22 DIAGNOSIS — I739 Peripheral vascular disease, unspecified: Secondary | ICD-10-CM | POA: Diagnosis not present

## 2014-06-22 DIAGNOSIS — E1051 Type 1 diabetes mellitus with diabetic peripheral angiopathy without gangrene: Secondary | ICD-10-CM | POA: Diagnosis not present

## 2014-06-22 DIAGNOSIS — L84 Corns and callosities: Secondary | ICD-10-CM | POA: Diagnosis not present

## 2014-06-22 DIAGNOSIS — L603 Nail dystrophy: Secondary | ICD-10-CM | POA: Diagnosis not present

## 2014-06-29 DIAGNOSIS — I480 Paroxysmal atrial fibrillation: Secondary | ICD-10-CM | POA: Diagnosis not present

## 2014-06-29 DIAGNOSIS — R8290 Unspecified abnormal findings in urine: Secondary | ICD-10-CM | POA: Diagnosis not present

## 2014-06-29 DIAGNOSIS — Z125 Encounter for screening for malignant neoplasm of prostate: Secondary | ICD-10-CM | POA: Diagnosis not present

## 2014-06-29 DIAGNOSIS — E785 Hyperlipidemia, unspecified: Secondary | ICD-10-CM | POA: Diagnosis not present

## 2014-06-29 DIAGNOSIS — I10 Essential (primary) hypertension: Secondary | ICD-10-CM | POA: Diagnosis not present

## 2014-06-29 DIAGNOSIS — Z7901 Long term (current) use of anticoagulants: Secondary | ICD-10-CM | POA: Diagnosis not present

## 2014-06-29 DIAGNOSIS — E1129 Type 2 diabetes mellitus with other diabetic kidney complication: Secondary | ICD-10-CM | POA: Diagnosis not present

## 2014-06-29 DIAGNOSIS — N390 Urinary tract infection, site not specified: Secondary | ICD-10-CM | POA: Diagnosis not present

## 2014-07-06 DIAGNOSIS — E1129 Type 2 diabetes mellitus with other diabetic kidney complication: Secondary | ICD-10-CM | POA: Diagnosis not present

## 2014-07-06 DIAGNOSIS — E785 Hyperlipidemia, unspecified: Secondary | ICD-10-CM | POA: Diagnosis not present

## 2014-07-06 DIAGNOSIS — Z6831 Body mass index (BMI) 31.0-31.9, adult: Secondary | ICD-10-CM | POA: Diagnosis not present

## 2014-07-06 DIAGNOSIS — R809 Proteinuria, unspecified: Secondary | ICD-10-CM | POA: Diagnosis not present

## 2014-07-06 DIAGNOSIS — N182 Chronic kidney disease, stage 2 (mild): Secondary | ICD-10-CM | POA: Diagnosis not present

## 2014-07-06 DIAGNOSIS — G629 Polyneuropathy, unspecified: Secondary | ICD-10-CM | POA: Diagnosis not present

## 2014-07-06 DIAGNOSIS — Z7901 Long term (current) use of anticoagulants: Secondary | ICD-10-CM | POA: Diagnosis not present

## 2014-07-06 DIAGNOSIS — I10 Essential (primary) hypertension: Secondary | ICD-10-CM | POA: Diagnosis not present

## 2014-07-06 DIAGNOSIS — I480 Paroxysmal atrial fibrillation: Secondary | ICD-10-CM | POA: Diagnosis not present

## 2014-07-06 DIAGNOSIS — Z0000 Encounter for general adult medical examination without abnormal findings: Secondary | ICD-10-CM | POA: Diagnosis not present

## 2014-07-06 DIAGNOSIS — N529 Male erectile dysfunction, unspecified: Secondary | ICD-10-CM | POA: Diagnosis not present

## 2014-07-06 DIAGNOSIS — E1140 Type 2 diabetes mellitus with diabetic neuropathy, unspecified: Secondary | ICD-10-CM | POA: Diagnosis not present

## 2014-07-15 ENCOUNTER — Encounter (HOSPITAL_COMMUNITY): Payer: Self-pay | Admitting: Emergency Medicine

## 2014-07-15 ENCOUNTER — Inpatient Hospital Stay (HOSPITAL_COMMUNITY)
Admission: EM | Admit: 2014-07-15 | Discharge: 2014-07-18 | DRG: 638 | Disposition: A | Discharge status: Home Health | Payer: Medicare Other | Attending: Internal Medicine | Admitting: Internal Medicine

## 2014-07-15 ENCOUNTER — Emergency Department (HOSPITAL_COMMUNITY): Payer: Medicare Other

## 2014-07-15 DIAGNOSIS — W1812XA Fall from or off toilet with subsequent striking against object, initial encounter: Secondary | ICD-10-CM | POA: Diagnosis present

## 2014-07-15 DIAGNOSIS — N390 Urinary tract infection, site not specified: Secondary | ICD-10-CM | POA: Diagnosis not present

## 2014-07-15 DIAGNOSIS — Z8546 Personal history of malignant neoplasm of prostate: Secondary | ICD-10-CM

## 2014-07-15 DIAGNOSIS — Z7901 Long term (current) use of anticoagulants: Secondary | ICD-10-CM

## 2014-07-15 DIAGNOSIS — I482 Chronic atrial fibrillation, unspecified: Secondary | ICD-10-CM

## 2014-07-15 DIAGNOSIS — Z923 Personal history of irradiation: Secondary | ICD-10-CM | POA: Diagnosis not present

## 2014-07-15 DIAGNOSIS — S2231XA Fracture of one rib, right side, initial encounter for closed fracture: Secondary | ICD-10-CM

## 2014-07-15 DIAGNOSIS — Z794 Long term (current) use of insulin: Secondary | ICD-10-CM

## 2014-07-15 DIAGNOSIS — W19XXXS Unspecified fall, sequela: Secondary | ICD-10-CM | POA: Diagnosis not present

## 2014-07-15 DIAGNOSIS — Y92002 Bathroom of unspecified non-institutional (private) residence single-family (private) house as the place of occurrence of the external cause: Secondary | ICD-10-CM

## 2014-07-15 DIAGNOSIS — E1165 Type 2 diabetes mellitus with hyperglycemia: Principal | ICD-10-CM | POA: Diagnosis present

## 2014-07-15 DIAGNOSIS — Z79899 Other long term (current) drug therapy: Secondary | ICD-10-CM | POA: Diagnosis not present

## 2014-07-15 DIAGNOSIS — E872 Acidosis: Secondary | ICD-10-CM | POA: Diagnosis present

## 2014-07-15 DIAGNOSIS — R350 Frequency of micturition: Secondary | ICD-10-CM | POA: Diagnosis present

## 2014-07-15 DIAGNOSIS — Y92009 Unspecified place in unspecified non-institutional (private) residence as the place of occurrence of the external cause: Secondary | ICD-10-CM

## 2014-07-15 DIAGNOSIS — H409 Unspecified glaucoma: Secondary | ICD-10-CM | POA: Diagnosis present

## 2014-07-15 DIAGNOSIS — E860 Dehydration: Secondary | ICD-10-CM

## 2014-07-15 DIAGNOSIS — I4819 Other persistent atrial fibrillation: Secondary | ICD-10-CM | POA: Insufficient documentation

## 2014-07-15 DIAGNOSIS — N179 Acute kidney failure, unspecified: Secondary | ICD-10-CM | POA: Diagnosis present

## 2014-07-15 DIAGNOSIS — E119 Type 2 diabetes mellitus without complications: Secondary | ICD-10-CM | POA: Diagnosis not present

## 2014-07-15 DIAGNOSIS — R7309 Other abnormal glucose: Secondary | ICD-10-CM | POA: Diagnosis not present

## 2014-07-15 DIAGNOSIS — R739 Hyperglycemia, unspecified: Secondary | ICD-10-CM

## 2014-07-15 DIAGNOSIS — W19XXXA Unspecified fall, initial encounter: Secondary | ICD-10-CM

## 2014-07-15 DIAGNOSIS — C61 Malignant neoplasm of prostate: Secondary | ICD-10-CM

## 2014-07-15 DIAGNOSIS — S2241XA Multiple fractures of ribs, right side, initial encounter for closed fracture: Secondary | ICD-10-CM | POA: Diagnosis present

## 2014-07-15 DIAGNOSIS — I4891 Unspecified atrial fibrillation: Secondary | ICD-10-CM | POA: Diagnosis present

## 2014-07-15 DIAGNOSIS — S2239XS Fracture of one rib, unspecified side, sequela: Secondary | ICD-10-CM

## 2014-07-15 DIAGNOSIS — S2239XA Fracture of one rib, unspecified side, initial encounter for closed fracture: Secondary | ICD-10-CM | POA: Diagnosis present

## 2014-07-15 LAB — CBC WITH DIFFERENTIAL/PLATELET
Basophils Absolute: 0 10*3/uL (ref 0.0–0.1)
Basophils Relative: 0 % (ref 0–1)
EOS PCT: 1 % (ref 0–5)
Eosinophils Absolute: 0.1 10*3/uL (ref 0.0–0.7)
HCT: 43.3 % (ref 39.0–52.0)
Hemoglobin: 15.5 g/dL (ref 13.0–17.0)
LYMPHS PCT: 10 % — AB (ref 12–46)
Lymphs Abs: 1.2 10*3/uL (ref 0.7–4.0)
MCH: 30.4 pg (ref 26.0–34.0)
MCHC: 35.8 g/dL (ref 30.0–36.0)
MCV: 84.9 fL (ref 78.0–100.0)
Monocytes Absolute: 0.5 10*3/uL (ref 0.1–1.0)
Monocytes Relative: 5 % (ref 3–12)
NEUTROS ABS: 9.8 10*3/uL — AB (ref 1.7–7.7)
Neutrophils Relative %: 84 % — ABNORMAL HIGH (ref 43–77)
Platelets: 231 10*3/uL (ref 150–400)
RBC: 5.1 MIL/uL (ref 4.22–5.81)
RDW: 12.4 % (ref 11.5–15.5)
WBC: 11.6 10*3/uL — ABNORMAL HIGH (ref 4.0–10.5)

## 2014-07-15 LAB — BASIC METABOLIC PANEL
Anion gap: 15 (ref 5–15)
BUN: 26 mg/dL — AB (ref 6–20)
CALCIUM: 9 mg/dL (ref 8.9–10.3)
CHLORIDE: 98 mmol/L — AB (ref 101–111)
CO2: 18 mmol/L — ABNORMAL LOW (ref 22–32)
Creatinine, Ser: 1.39 mg/dL — ABNORMAL HIGH (ref 0.61–1.24)
GFR calc Af Amer: 54 mL/min — ABNORMAL LOW (ref >60)
GFR calc non Af Amer: 47 mL/min — ABNORMAL LOW (ref >60)
Glucose, Bld: 564 mg/dL (ref 65–99)
Potassium: 5.1 mmol/L (ref 3.5–5.1)
SODIUM: 131 mmol/L — AB (ref 135–145)

## 2014-07-15 LAB — CBG MONITORING, ED: Glucose-Capillary: 435 mg/dL — ABNORMAL HIGH (ref 65–99)

## 2014-07-15 LAB — URINALYSIS, ROUTINE W REFLEX MICROSCOPIC
Bilirubin Urine: NEGATIVE
KETONES UR: 15 mg/dL — AB
NITRITE: POSITIVE — AB
PROTEIN: NEGATIVE mg/dL
Specific Gravity, Urine: 1.026 (ref 1.005–1.030)
Urobilinogen, UA: 0.2 mg/dL (ref 0.0–1.0)
pH: 5 (ref 5.0–8.0)

## 2014-07-15 LAB — GLUCOSE, CAPILLARY: Glucose-Capillary: 395 mg/dL — ABNORMAL HIGH (ref 65–99)

## 2014-07-15 LAB — PROTIME-INR
INR: 1.63 — ABNORMAL HIGH (ref 0.00–1.49)
Prothrombin Time: 19.4 seconds — ABNORMAL HIGH (ref 11.6–15.2)

## 2014-07-15 LAB — URINE MICROSCOPIC-ADD ON

## 2014-07-15 MED ORDER — SODIUM CHLORIDE 0.9 % IV BOLUS (SEPSIS)
1000.0000 mL | Freq: Once | INTRAVENOUS | Status: AC
  Administered 2014-07-15: 1000 mL via INTRAVENOUS

## 2014-07-15 MED ORDER — INSULIN GLARGINE 100 UNIT/ML ~~LOC~~ SOLN
45.0000 [IU] | Freq: Every day | SUBCUTANEOUS | Status: DC
  Administered 2014-07-15 – 2014-07-17 (×3): 45 [IU] via SUBCUTANEOUS
  Filled 2014-07-15 (×4): qty 0.45

## 2014-07-15 MED ORDER — ONDANSETRON HCL 4 MG/2ML IJ SOLN
4.0000 mg | Freq: Once | INTRAMUSCULAR | Status: AC
  Administered 2014-07-15: 4 mg via INTRAVENOUS
  Filled 2014-07-15: qty 2

## 2014-07-15 MED ORDER — WARFARIN - PHARMACIST DOSING INPATIENT
Freq: Every day | Status: DC
  Administered 2014-07-17: 18:00:00

## 2014-07-15 MED ORDER — PRAVASTATIN SODIUM 10 MG PO TABS
10.0000 mg | ORAL_TABLET | Freq: Every day | ORAL | Status: DC
  Administered 2014-07-15 – 2014-07-17 (×3): 10 mg via ORAL
  Filled 2014-07-15 (×4): qty 1

## 2014-07-15 MED ORDER — DEXTROSE 5 % IV SOLN
1.0000 g | INTRAVENOUS | Status: DC
  Administered 2014-07-15 – 2014-07-17 (×3): 1 g via INTRAVENOUS
  Filled 2014-07-15 (×4): qty 10

## 2014-07-15 MED ORDER — SODIUM CHLORIDE 0.9 % IV SOLN
INTRAVENOUS | Status: DC
  Administered 2014-07-15: 21:00:00 via INTRAVENOUS

## 2014-07-15 MED ORDER — ONDANSETRON HCL 4 MG PO TABS: 4.0000 mg | ORAL_TABLET | Freq: Four times a day (QID) | ORAL | Status: DC | PRN

## 2014-07-15 MED ORDER — ONDANSETRON HCL 4 MG/2ML IJ SOLN: 4.0000 mg | Freq: Four times a day (QID) | INTRAMUSCULAR | Status: DC | PRN

## 2014-07-15 MED ORDER — LATANOPROST 0.005 % OP SOLN
1.0000 [drp] | Freq: Every day | OPHTHALMIC | Status: DC
  Administered 2014-07-15 – 2014-07-17 (×3): 1 [drp] via OPHTHALMIC
  Filled 2014-07-15: qty 2.5

## 2014-07-15 MED ORDER — INSULIN ASPART 100 UNIT/ML ~~LOC~~ SOLN
0.0000 [IU] | Freq: Three times a day (TID) | SUBCUTANEOUS | Status: DC
  Administered 2014-07-16 (×3): 7 [IU] via SUBCUTANEOUS
  Administered 2014-07-17: 2 [IU] via SUBCUTANEOUS
  Administered 2014-07-17: 3 [IU] via SUBCUTANEOUS
  Administered 2014-07-17: 2 [IU] via SUBCUTANEOUS
  Administered 2014-07-18: 1 [IU] via SUBCUTANEOUS
  Administered 2014-07-18: 2 [IU] via SUBCUTANEOUS

## 2014-07-15 MED ORDER — WARFARIN SODIUM 5 MG PO TABS: 5.0000 mg | ORAL_TABLET | Freq: Every day | ORAL | Status: DC

## 2014-07-15 MED ORDER — FENTANYL CITRATE (PF) 100 MCG/2ML IJ SOLN
50.0000 ug | Freq: Once | INTRAMUSCULAR | Status: AC
  Administered 2014-07-15: 50 ug via INTRAVENOUS
  Filled 2014-07-15: qty 2

## 2014-07-15 MED ORDER — CETYLPYRIDINIUM CHLORIDE 0.05 % MT LIQD
7.0000 mL | Freq: Two times a day (BID) | OROMUCOSAL | Status: DC
  Administered 2014-07-16 – 2014-07-18 (×4): 7 mL via OROMUCOSAL

## 2014-07-15 MED ORDER — MORPHINE SULFATE 4 MG/ML IJ SOLN: 4.0000 mg | Freq: Once | INTRAMUSCULAR | Status: DC

## 2014-07-15 MED ORDER — INSULIN ASPART 100 UNIT/ML ~~LOC~~ SOLN
10.0000 [IU] | Freq: Once | SUBCUTANEOUS | Status: AC
  Administered 2014-07-15: 10 [IU] via SUBCUTANEOUS
  Filled 2014-07-15: qty 1

## 2014-07-15 MED ORDER — HYDROCODONE-ACETAMINOPHEN 5-325 MG PO TABS: 1.0000 | ORAL_TABLET | ORAL | Status: DC | PRN

## 2014-07-15 MED ORDER — SODIUM CHLORIDE 0.9 % IV SOLN
INTRAVENOUS | Status: AC
  Administered 2014-07-16: 10:00:00 via INTRAVENOUS

## 2014-07-15 MED ORDER — OXYCODONE-ACETAMINOPHEN 5-325 MG PO TABS
1.0000 | ORAL_TABLET | Freq: Once | ORAL | Status: AC
  Administered 2014-07-15: 1 via ORAL
  Filled 2014-07-15: qty 1

## 2014-07-15 MED ORDER — SODIUM CHLORIDE 0.9 % IJ SOLN
3.0000 mL | Freq: Two times a day (BID) | INTRAMUSCULAR | Status: DC
  Administered 2014-07-17 – 2014-07-18 (×2): 3 mL via INTRAVENOUS

## 2014-07-15 NOTE — Progress Notes (Signed)
Patient arrived to unit via stretcher to [phone]. Pt alert and oriented x 4, IV intact. Skin intact. Discoloration noted to patient's bilateral lower extremities. Pt states that he has peripheral neuropathy and currently does not take any medication for it. In no distress at this time, states pain "3" out of 10. On 2L nasal cannula non-dependent, fentanyl IV given by ED RN and placed on patient at that time, RN stated patient sats dropped to 88% on room air. Awaiting MD orders, will continue to monitor patient.

## 2014-07-15 NOTE — H&P (Signed)
PCP:   No primary care provider on file.   Chief Complaint:  fell  HPI: 81 yo male h/o IDDM, afib, prostate cancer s/p XRT comes in after several days of increased urinary freq.  Saw his urologist earlier in the week and started on flomax, no abx.  Today pt was getting up from toilet when he lost his balance and fell onto the bathtub hurting his ribs.  He has not taken his lantus in 3 days because he says he just being lazy.  No fevers at home.  No n/v/d.  Ribs hurt when deep breathing.  No pain elsewhere.  Referred for admission for glucose over 500, uti and dehydration.  Review of Systems:  Positive and negative as per HPI otherwise all other systems are negative  Past Medical History: Past Medical History  Diagnosis Date  . Cataract     no surgery yet  . Diabetes mellitus without complication     type II  . Glaucoma     left eye  . Atrial fibrillation   . Neuropathy     diabetic, in feet  . Cancer     prostate cancer  . Personal history of fall 09/26/13    front entrance of [place]  . Hx of radiation therapy 08/15/13- 10/06/13    prostate 7600 cGy 38 sessions   Past Surgical History  Procedure Laterality Date  . Prostate biopsy  12/25/11    Adenocarcinoma, gleason 6    Medications: Prior to Admission medications   Medication Sig Start Date End Date Taking? Authorizing Provider  BESIVANCE 0.6 % SUSP  05/30/14   Historical Provider, MD  DUREZOL 0.05 % EMUL  05/30/14   Historical Provider, MD  glyBURIDE-metformin (GLUCOVANCE) 2.5-500 MG per tablet Take 1 tablet by mouth 2 (two) times daily with a meal.     Historical Provider, MD  ILEVRO 0.3 % ophthalmic suspension  05/30/14   Historical Provider, MD  insulin glargine (LANTUS) 100 UNIT/ML injection Inject 40 Units into the skin at bedtime.    Historical Provider, MD  latanoprost (XALATAN) 0.005 % ophthalmic solution Place 1 drop into both eyes at bedtime.     Historical Provider, MD  pravastatin (PRAVACHOL) 10 MG tablet Take 10  mg by mouth daily.    Historical Provider, MD  warfarin (COUMADIN) 5 MG tablet Take 5 mg by mouth daily with breakfast. Hx A-Fib, per coumadin pharmacy protocol Takes 5mg  on Monday, Wednesday, and Friday. Takes 7.5mg  on Tuesday, Thursday, Saturday and Sunday.    Historical Provider, MD    Allergies:   Allergies  Allergen Reactions  . Aureomycin [Chlortetracycline] Itching and Rash    Reaction approximately 1956    Social History:  reports that he has never smoked. He does not have any smokeless tobacco history on file. He reports that he does not drink alcohol or use illicit drugs.  Family History: Family History  Problem Relation Age of Onset  . Cancer Sister 39    brain tumor died at age 54    Physical Exam: Filed Vitals:   07/15/14 1923 07/15/14 1930 07/15/14 2030  BP: 154/83 140/70 178/96  Pulse: 94 87 94  Temp: 97.7 F (36.5 C)    TempSrc: Oral    Resp: 16 25 16   SpO2: 99% 100% 94%   General appearance: alert, cooperative and no distress Head: Normocephalic, without obvious abnormality, atraumatic Eyes: negative Nose: Nares normal. Septum midline. Mucosa normal. No drainage or sinus tenderness. Neck: no JVD and supple, symmetrical,  trachea midline Lungs: clear to auscultation bilaterally pain ttp right chest wall Heart: regular rate and rhythm, S1, S2 normal, no murmur, click, rub or gallop Abdomen: soft, non-tender; bowel sounds normal; no masses,  no organomegaly Extremities: extremities normal, atraumatic, no cyanosis or edema Pulses: 2+ and symmetric Skin: Skin color, texture, turgor normal. No rashes or lesions Neurologic: Grossly normal  Labs on Admission:   Recent Labs  07/15/14 1935  NA 131*  K 5.1  CL 98*  CO2 18*  GLUCOSE 564*  BUN 26*  CREATININE 1.39*  CALCIUM 9.0    Recent Labs  07/15/14 1935  WBC 11.6*  NEUTROABS 9.8*  HGB 15.5  HCT 43.3  MCV 84.9  PLT 231   Radiological Exams on Admission: Dg Ribs Unilateral W/chest  Right  07/15/2014   CLINICAL DATA:  Fall, right posterior rib pain  EXAM: RIGHT RIBS AND CHEST - 3+ VIEW  COMPARISON:  None.  FINDINGS: Lungs are clear.  No pleural effusion or pneumothorax.  The heart is top-normal in size.  Right lateral 9th and 10th rib fractures.  IMPRESSION: Right lateral 9th and 10th rib fractures.   Electronically Signed   By: Julian Hy M.D.   On: 07/15/2014 20:12   xrays and ekg reviewed by myself Old record reviewed also  Assessment/Plan  81 yo male with mild acidosis from uncontrolled diabetes, uti, mild aki and rib fractures from fall at home  Principal Problem:   Hyperglycemia-  Pt has received 2 liters ivf, and 10 u insulin will recheck glucose now.  Will repeat bmp in couple of hours.  Give lantus now and place on ssi.  ivf overnight.  May also be exacerbated from infection.  Treat underlying infection.  Active Problems:   Diabetes mellitus without complication-  As above   Atrial fibrillation-  Rate controlled.  Pharm consult for coumadin   Hx of radiation therapy- noted   UTI (lower urinary tract infection)-  Rocephin iv.  Urine culture ordered.   Fall at home- noted   Closed rib fracture multiple-  Noted, po norco.  Monitor closely for any respiratory compromise overnight.   Dehydration, mild-  ivf.  Admit to telemetry bed.  FULL CODE.  Mairely Foxworth A 07/15/2014, 10:13 PM

## 2014-07-15 NOTE — ED Provider Notes (Signed)
CSN: [id number]     Arrival date & time 07/15/14  1907 History   First MD Initiated Contact with Patient 07/15/14 1909     Chief Complaint  Patient presents with  . Fall  . Hyperglycemia     (Consider location/radiation/quality/duration/timing/severity/associated sxs/prior Treatment) HPI Comments: Patient here after having a mechanical fall just prior to arrival. He was getting off the commode and lost his balance. Did not strike his head. Most of the force landed on the right portion of his chest. He complains of sharp pain worse with movement. Denies any dyspnea. No abdominal pain. No back or hip pain. No symptoms prior to the fall. Called EMS and blood sugar was noted to be 400. Patient has a diabetic and did not take his blood sugar this morning. He does use injectable insulin. Denies any recent illnesses  Patient is a 81 y.o. male presenting with fall and hyperglycemia. The history is provided by the patient.  Fall  Hyperglycemia   Past Medical History  Diagnosis Date  . Cataract     no surgery yet  . Diabetes mellitus without complication     type II  . Glaucoma     left eye  . Atrial fibrillation   . Neuropathy     diabetic, in feet  . Cancer     prostate cancer  . Personal history of fall 09/26/13    front entrance of [place]  . Hx of radiation therapy 08/15/13- 10/06/13    prostate 7600 cGy 38 sessions   Past Surgical History  Procedure Laterality Date  . Prostate biopsy  12/25/11    Adenocarcinoma, gleason 6   Family History  Problem Relation Age of Onset  . Cancer Sister 43    brain tumor died at age 64   History  Substance Use Topics  . Smoking status: Never Smoker   . Smokeless tobacco: Not on file  . Alcohol Use: No    Review of Systems  All other systems reviewed and are negative.     Allergies  Review of patient's allergies indicates no known allergies.  Home Medications   Prior to Admission medications   Medication Sig Start Date End Date  Taking? Authorizing Provider  glyBURIDE-metformin (GLUCOVANCE) 2.5-500 MG per tablet Take 1 tablet by mouth 2 (two) times daily with a meal.     Historical Provider, MD  insulin glargine (LANTUS) 100 UNIT/ML injection Inject 40 Units into the skin at bedtime.    Historical Provider, MD  latanoprost (XALATAN) 0.005 % ophthalmic solution Place 1 drop into both eyes at bedtime.     Historical Provider, MD  pravastatin (PRAVACHOL) 10 MG tablet Take 10 mg by mouth daily.    Historical Provider, MD  vardenafil (LEVITRA) 20 MG tablet Take 20 mg by mouth daily as needed.    Historical Provider, MD  warfarin (COUMADIN) 5 MG tablet Take 5 mg by mouth daily with breakfast. Hx A-Fib, per coumadin pharmacy protocol Takes 5mg  on Monday, Wednesday, and Friday. Takes 7.5mg  on Tuesday, Thursday, Saturday and Sunday.    Historical Provider, MD   BP 154/83 mmHg  Pulse 94  Temp(Src) 97.7 F (36.5 C) (Oral)  Resp 16  SpO2 99% Physical Exam  Constitutional: He is oriented to person, place, and time. He appears well-developed and well-nourished.  Non-toxic appearance. No distress.  HENT:  Head: Normocephalic and atraumatic.  Eyes: Conjunctivae, EOM and lids are normal. Pupils are equal, round, and reactive to light.  Neck: Normal range of  motion. Neck supple. No tracheal deviation present. No thyroid mass present.  Cardiovascular: Normal rate, regular rhythm and normal heart sounds.  Exam reveals no gallop.   No murmur heard. Pulmonary/Chest: Effort normal and breath sounds normal. No stridor. No respiratory distress. He has no decreased breath sounds. He has no wheezes. He has no rhonchi. He has no rales. He exhibits tenderness and bony tenderness. He exhibits no laceration and no crepitus.    Abdominal: Soft. Normal appearance and bowel sounds are normal. He exhibits no distension. There is no tenderness. There is no rebound and no CVA tenderness.  Musculoskeletal: Normal range of motion. He exhibits no edema  or tenderness.  Neurological: He is alert and oriented to person, place, and time. He has normal strength. No cranial nerve deficit or sensory deficit. GCS eye subscore is 4. GCS verbal subscore is 5. GCS motor subscore is 6.  Skin: Skin is warm and dry. No abrasion and no rash noted.  Psychiatric: He has a normal mood and affect. His speech is normal and behavior is normal.  Nursing note and vitals reviewed.   ED Course  Procedures (including critical care time) Labs Review Labs Reviewed  CBC WITH DIFFERENTIAL/PLATELET  BASIC METABOLIC PANEL  URINALYSIS, ROUTINE W REFLEX MICROSCOPIC (NOT AT [facility])  PROTIME-INR    Imaging Review No results found.   EKG Interpretation   Date/Time:  Saturday Jul 15 2014 19:18:29 EDT Ventricular Rate:  91 PR Interval:    QRS Duration: 97 QT Interval:  347 QTC Calculation: 427 R Axis:   64 Text Interpretation:  Atrial fibrillation Anteroseptal infarct, old No  significant change since last tracing Confirmed by Keison Glendinning  MD, Penelope Fittro  ([id number]) on 07/15/2014 7:26:10 PM      MDM   Final diagnoses:  None    Patient's hyperglycemia noted and patient given 2 L of saline as well as insulin 10 units subcutaneous. Given pain medication as well 2. Mentating appropriately. Patient treated with Rocephin for his UTI. He is not splinting. Will be admitted to the medicine service. Results discussed with family. Patient's elevated creatinine noted likely from dehydration. His anion gap is 15.  CRITICAL CARE Performed by: Leota Jacobsen Total critical care time: 50 Critical care time was exclusive of separately billable procedures and treating other patients. Critical care was necessary to treat or prevent imminent or life-threatening deterioration. Critical care was time spent personally by me on the following activities: development of treatment plan with patient and/or surrogate as well as nursing, discussions with consultants, evaluation of patient's  response to treatment, examination of patient, obtaining history from patient or surrogate, ordering and performing treatments and interventions, ordering and review of laboratory studies, ordering and review of radiographic studies, pulse oximetry and re-evaluation of patient's condition.     Lacretia Leigh, MD 07/15/14 2133

## 2014-07-15 NOTE — Progress Notes (Signed)
Report taken from [facility] [place] in ED

## 2014-07-15 NOTE — ED Notes (Signed)
Per ems, pt w/mechanical trip and fall striking right flank on tub. Incidental finding of blood sugar greater than 500 per ems.  Pt arrives awake, alert, oriented, c/o right flank pain.

## 2014-07-15 NOTE — Progress Notes (Signed)
ANTICOAGULATION CONSULT NOTE - Initial Consult  Pharmacy Consult for Warfarin Indication: atrial fibrillation  Allergies  Allergen Reactions  . Aureomycin [Chlortetracycline] Itching and Rash    Reaction approximately 1956    Patient Measurements:   Heparin Dosing Weight:   Vital Signs: Temp: 97.7 F (36.5 C) (05/28 1923) Temp Source: Oral (05/28 1923) BP: 136/75 mmHg (05/28 2200) Pulse Rate: 104 (05/28 2200)  Labs:  Recent Labs  07/15/14 1935  HGB 15.5  HCT 43.3  PLT 231  LABPROT 19.4*  INR 1.63*  CREATININE 1.39*    CrCl cannot be calculated (Unknown ideal weight.).   Medical History: Past Medical History  Diagnosis Date  . Cataract     no surgery yet  . Diabetes mellitus without complication     type II  . Glaucoma     left eye  . Atrial fibrillation   . Neuropathy     diabetic, in feet  . Cancer     prostate cancer  . Personal history of fall 09/26/13    front entrance of [place]  . Hx of radiation therapy 08/15/13- 10/06/13    prostate 7600 cGy 38 sessions    Medications:  Prescriptions prior to admission  Medication Sig Dispense Refill Last Dose  . glyBURIDE-metformin (GLUCOVANCE) 2.5-500 MG per tablet Take 1 tablet by mouth 2 (two) times daily with a meal.    1-2 weeks ago  . insulin glargine (LANTUS) 100 UNIT/ML injection Inject 45 Units into the skin at bedtime.    07/11/2014  . latanoprost (XALATAN) 0.005 % ophthalmic solution Place 1 drop into both eyes at bedtime.    07/12/2014  . pravastatin (PRAVACHOL) 10 MG tablet Take 10 mg by mouth at bedtime.    10 days ago  . warfarin (COUMADIN) 5 MG tablet Take 5 mg by mouth daily with breakfast. Hx A-Fib, per coumadin pharmacy protocol Take 1 tablet (5 mg) on Sunday, Tuesday, Thursday, Saturday; take 1 1/2 tablet (7.5 mg) Monday, Wednesday, Friday   07/15/2014 at Unknown time  . BESIVANCE 0.6 % SUSP    not yet taken  . DUREZOL 0.05 % EMUL    not yet taken  . ILEVRO 0.3 % ophthalmic suspension    not yet  taken   Scheduled:  . cefTRIAXone (ROCEPHIN)  IV  1 g Intravenous Q24H  . [START ON 07/16/2014] insulin aspart  0-9 Units Subcutaneous TID WC  . insulin glargine  45 Units Subcutaneous QHS  . latanoprost  1 drop Both Eyes QHS  . pravastatin  10 mg Oral QHS  . sodium chloride  3 mL Intravenous Q12H  . [START ON 07/16/2014] Warfarin - Pharmacist Dosing Inpatient   Does not apply q1800   Infusions:  . sodium chloride      Assessment: 81yo male with history of Afib and DM2 presents after fall. Pharmacy is consulted to dose warfarin for atrial fibrillation. INR on presentation is 1.63, CBC is wnl, sCr 1.4.  PTA warfarin dose: 7.5mg  MWF and 5mg  AODs with last dose taken 07/15/14  Goal of Therapy:  INR 2-3 Monitor platelets by anticoagulation protocol: Yes   Plan:  Will dose warfarin based on AM labs Daily INR/CBC Monitor s/sx of bleeding Re-educate patient on warfarin  Charles Friedman. Diona Foley, PharmD Clinical Pharmacist Pager [phone] 07/15/2014,10:51 PM

## 2014-07-15 NOTE — ED Notes (Signed)
Report attempt x 1 

## 2014-07-16 DIAGNOSIS — S2239XS Fracture of one rib, unspecified side, sequela: Secondary | ICD-10-CM

## 2014-07-16 DIAGNOSIS — R739 Hyperglycemia, unspecified: Secondary | ICD-10-CM

## 2014-07-16 DIAGNOSIS — N390 Urinary tract infection, site not specified: Secondary | ICD-10-CM

## 2014-07-16 DIAGNOSIS — E119 Type 2 diabetes mellitus without complications: Secondary | ICD-10-CM

## 2014-07-16 LAB — BASIC METABOLIC PANEL
ANION GAP: 10 (ref 5–15)
Anion gap: 11 (ref 5–15)
BUN: 23 mg/dL — ABNORMAL HIGH (ref 6–20)
BUN: 23 mg/dL — AB (ref 6–20)
CHLORIDE: 104 mmol/L (ref 101–111)
CO2: 20 mmol/L — AB (ref 22–32)
CO2: 20 mmol/L — AB (ref 22–32)
Calcium: 8.7 mg/dL — ABNORMAL LOW (ref 8.9–10.3)
Calcium: 8.8 mg/dL — ABNORMAL LOW (ref 8.9–10.3)
Chloride: 103 mmol/L (ref 101–111)
Creatinine, Ser: 1.18 mg/dL (ref 0.61–1.24)
Creatinine, Ser: 1.27 mg/dL — ABNORMAL HIGH (ref 0.61–1.24)
GFR calc Af Amer: >60 mL/min (ref >60)
GFR calc non Af Amer: 52 mL/min — ABNORMAL LOW (ref >60)
GFR calc non Af Amer: 57 mL/min — ABNORMAL LOW (ref >60)
Glucose, Bld: 363 mg/dL — ABNORMAL HIGH (ref 65–99)
Glucose, Bld: 380 mg/dL — ABNORMAL HIGH (ref 65–99)
POTASSIUM: 4.4 mmol/L (ref 3.5–5.1)
Potassium: 4.5 mmol/L (ref 3.5–5.1)
SODIUM: 134 mmol/L — AB (ref 135–145)
Sodium: 134 mmol/L — ABNORMAL LOW (ref 135–145)

## 2014-07-16 LAB — GLUCOSE, CAPILLARY
GLUCOSE-CAPILLARY: 338 mg/dL — AB (ref 65–99)
GLUCOSE-CAPILLARY: 258 mg/dL — AB (ref 65–99)
Glucose-Capillary: 316 mg/dL — ABNORMAL HIGH (ref 65–99)
Glucose-Capillary: 337 mg/dL — ABNORMAL HIGH (ref 65–99)

## 2014-07-16 LAB — CBC
HCT: 42.8 % (ref 39.0–52.0)
Hemoglobin: 15.1 g/dL (ref 13.0–17.0)
MCH: 30.3 pg (ref 26.0–34.0)
MCHC: 35.3 g/dL (ref 30.0–36.0)
MCV: 85.8 fL (ref 78.0–100.0)
Platelets: 237 10*3/uL (ref 150–400)
RBC: 4.99 MIL/uL (ref 4.22–5.81)
RDW: 12.7 % (ref 11.5–15.5)
WBC: 11.3 10*3/uL — ABNORMAL HIGH (ref 4.0–10.5)

## 2014-07-16 LAB — PROTIME-INR
INR: 1.48 (ref 0.00–1.49)
Prothrombin Time: 18 seconds — ABNORMAL HIGH (ref 11.6–15.2)

## 2014-07-16 MED ORDER — ENOXAPARIN SODIUM 60 MG/0.6ML ~~LOC~~ SOLN
60.0000 mg | SUBCUTANEOUS | Status: DC
  Administered 2014-07-16 – 2014-07-17 (×2): 60 mg via SUBCUTANEOUS
  Filled 2014-07-16 (×3): qty 0.6

## 2014-07-16 MED ORDER — WARFARIN SODIUM 10 MG PO TABS
10.0000 mg | ORAL_TABLET | Freq: Once | ORAL | Status: AC
Start: 2014-07-16 — End: 2014-07-16
  Administered 2014-07-16: 10 mg via ORAL
  Filled 2014-07-16: qty 1

## 2014-07-16 MED ORDER — ENOXAPARIN SODIUM 60 MG/0.6ML ~~LOC~~ SOLN
60.0000 mg | SUBCUTANEOUS | Status: DC
  Filled 2014-07-16: qty 0.6

## 2014-07-16 NOTE — Evaluation (Signed)
Physical Therapy Evaluation Patient Details Name: Charles Friedman MRN: [id number] DOB: 10-29-1932 Today's Date: 07/16/2014   History of Present Illness  Pt adm after fall at home and has rt rib fx's. Pt also with hyperglycemia and UTI.  PMH - DM, prostate CA, afib, neuropathy  Clinical Impression  Pt admitted with above diagnosis and presents to PT with functional limitations due to deficits listed below (See PT problem list). Pt needs skilled PT to maximize independence and safety to allow discharge to home if someone can stay with him the first few days.      Follow Up Recommendations Home health PT;Supervision/Assistance - 24 hour (for initial few days)    Equipment Recommendations  None recommended by PT    Recommendations for Other Services OT consult     Precautions / Restrictions Precautions Precautions: Fall      Mobility  Bed Mobility Overal bed mobility: Needs Assistance Bed Mobility: Supine to Sit     Supine to sit: Min assist     General bed mobility comments: Assist to bring trunk up into sitting  Transfers Overall transfer level: Needs assistance Equipment used: Rolling walker (2 wheeled) Transfers: Sit to/from [facility] Sit to Stand: Min assist Stand pivot transfers: Min assist       General transfer comment: Verbal cues for hand placement and assist to bring hips up.  Ambulation/Gait Ambulation/Gait assistance: Min guard Ambulation Distance (Feet): 2 Feet (Pivotal steps to chair.) Assistive device: Rolling walker (2 wheeled) Gait Pattern/deviations: Decreased step length - right;Decreased step length - left        Stairs            Wheelchair Mobility    Modified Rankin (Stroke Patients Only)       Balance Overall balance assessment: Needs assistance Sitting-balance support: No upper extremity supported;Feet supported Sitting balance-Leahy Scale: Good     Standing balance support: Bilateral upper extremity  supported;During functional activity Standing balance-Leahy Scale: Poor Standing balance comment: support of walker                             Pertinent Vitals/Pain Pain Assessment: Faces Faces Pain Scale: Hurts a little bit Pain Location: rt ribs Pain Intervention(s): Limited activity within patient's tolerance;Repositioned    Home Living Family/patient expects to be discharged to:: Private residence Living Arrangements: Alone   Type of Home: House Home Access: Stairs to enter Entrance Stairs-Rails: Right Entrance Stairs-Number of Steps: 3-4 Home Layout: Two level;Laundry or work area in [place]: Environmental consultant - 2 wheels      Prior Function Level of Independence: Independent         Comments: Usually furniture walks in the house.     Hand Dominance        Extremity/Trunk Assessment   Upper Extremity Assessment: Overall WFL for tasks assessed           Lower Extremity Assessment: Generalized weakness         Communication   Communication: No difficulties  Cognition Arousal/Alertness: Awake/alert Behavior During Therapy: WFL for tasks assessed/performed Overall Cognitive Status: Within Functional Limits for tasks assessed                      General Comments      Exercises        Assessment/Plan    PT Assessment Patient needs continued PT services  PT Diagnosis Difficulty walking;Generalized weakness;Acute pain  PT Problem List Decreased strength;Decreased activity tolerance;Decreased balance;Decreased mobility;Decreased knowledge of use of DME;Pain  PT Treatment Interventions DME instruction;Gait training;Functional mobility training;Therapeutic activities;Therapeutic exercise;Balance training;Patient/family education   PT Goals (Current goals can be found in the Care Plan section) Acute Rehab PT Goals Patient Stated Goal: Return home PT Goal Formulation: With patient Time For Goal Achievement:  07/23/14 Potential to Achieve Goals: Good    Frequency Min 3X/week   Barriers to discharge Decreased caregiver support      Co-evaluation               End of Session Equipment Utilized During Treatment: Oxygen Activity Tolerance: Patient tolerated treatment well Patient left: in chair;with call bell/phone within reach;with chair alarm set;with family/visitor present Nurse Communication: Mobility status         Time: 8588-5027 PT Time Calculation (min) (ACUTE ONLY): 19 min   Charges:   PT Evaluation $Initial PT Evaluation Tier I: 1 Procedure     PT G Codes:        Charles Friedman 06-Aug-2014, 4:16 PM  [facility] [place]

## 2014-07-16 NOTE — Progress Notes (Signed)
[place] for Warfarin Indication: atrial fibrillation  Allergies  Allergen Reactions  . Aureomycin [Chlortetracycline] Itching and Rash    Reaction approximately 1956    Patient Measurements: Height: 6\' 1"  (185.4 cm) Weight: 263 lb (119.296 kg) IBW/kg (Calculated) : 79.9   Vital Signs: Temp: 97.6 F (36.4 C) (05/29 0514) Temp Source: Oral (05/29 0514) BP: 141/73 mmHg (05/29 0514) Pulse Rate: 97 (05/29 0514)  Labs:  Recent Labs  07/15/14 1935 07/16/14 0001 07/16/14 0526  HGB 15.5  --  15.1  HCT 43.3  --  42.8  PLT 231  --  237  LABPROT 19.4*  --  18.0*  INR 1.63*  --  1.48  CREATININE 1.39* 1.27* 1.18    Estimated Creatinine Clearance: 68.7 mL/min (by C-G formula based on Cr of 1.18).   Assessment: 81yo male with history of Afib and DM2 presents after fall. Pharmacy consulted to dose warfarin for atrial fibrillation. INR on admit 1.63. INR is 1.48 today, below goal.  CBC WNL, no bleeding noted.   PTA warfarin dose: 7.5mg  MWF and 5mg  AODs with last dose taken 07/15/14  Goal of Therapy:  INR 2-3   Plan:  - add LMWH 60 mg (0.5 mg/kg for VTE px in obesity) until INR > = 2 -coumadin 10 mg po x 1 dose -Daily INR -Monitor s/sx of bleeding  Eudelia Bunch, Pharm.D. [phone] 07/16/2014 10:40 AM

## 2014-07-16 NOTE — Progress Notes (Signed)
PROGRESS NOTE  Charles Friedman [id number] DOB: [birth year] DOA: 07/15/2014 PCP: Haywood Pao, MD  Assessment/Plan: Hyperglycemia-  -2 liters ivf -resume lantus - ssi.    Diabetes mellitus without complication- As above  Atrial fibrillation- Rate controlled. Pharm consult for coumadin  Hx of radiation therapy- noted  UTI (lower urinary tract infection)- Rocephin iv. Urine culture ordered.  Fall at home- noted  Closed rib fracture multiple- Noted, po norco. Monitor closely for any respiratory compromise overnight.  Dehydration, mild- ivf.  Code Status: full Family Communication: patient Disposition Plan:    Consultants:    Procedures:      HPI/Subjective: No SOB, no CP Able to take deep breaths without pain  Objective: Filed Vitals:   07/16/14 0514  BP: 141/73  Pulse: 97  Temp: 97.6 F (36.4 C)  Resp: 22    Intake/Output Summary (Last 24 hours) at 07/16/14 1018 Last data filed at 07/16/14 0958  Gross per 24 hour  Intake 963.25 ml  Output   1400 ml  Net -436.75 ml   Filed Weights   07/15/14 2245 07/16/14 0314  Weight: 118.978 kg (262 lb 4.8 oz) 119.296 kg (263 lb)    Exam:   General:  A+Ox3, NAD  Cardiovascular: rrr  Respiratory: clear  Abdomen: +BS, soft  Musculoskeletal: no edema   Data Reviewed: Basic Metabolic Panel:  Recent Labs Lab 07/15/14 1935 07/16/14 0001 07/16/14 0526  NA 131* 134* 134*  K 5.1 4.4 4.5  CL 98* 103 104  CO2 18* 20* 20*  GLUCOSE 564* 380* 363*  BUN 26* 23* 23*  CREATININE 1.39* 1.27* 1.18  CALCIUM 9.0 8.7* 8.8*   Liver Function Tests: No results for input(s): AST, ALT, ALKPHOS, BILITOT, PROT, ALBUMIN in the last 168 hours. No results for input(s): LIPASE, AMYLASE in the last 168 hours. No results for input(s): AMMONIA in the last 168 hours. CBC:  Recent Labs Lab 07/15/14 1935 07/16/14 0526  WBC 11.6* 11.3*  NEUTROABS 9.8*  --   HGB 15.5 15.1  HCT 43.3 42.8  MCV 84.9 85.8    PLT 231 237   Cardiac Enzymes: No results for input(s): CKTOTAL, CKMB, CKMBINDEX, TROPONINI in the last 168 hours. BNP (last 3 results) No results for input(s): BNP in the last 8760 hours.  ProBNP (last 3 results) No results for input(s): PROBNP in the last 8760 hours.  CBG:  Recent Labs Lab 07/15/14 2222 07/15/14 2252 07/16/14 0748  GLUCAP 435* 395* 316*    No results found for this or any previous visit (from the past 240 hour(s)).   Studies: Dg Ribs Unilateral W/chest Right  07/15/2014   CLINICAL DATA:  Fall, right posterior rib pain  EXAM: RIGHT RIBS AND CHEST - 3+ VIEW  COMPARISON:  None.  FINDINGS: Lungs are clear.  No pleural effusion or pneumothorax.  The heart is top-normal in size.  Right lateral 9th and 10th rib fractures.  IMPRESSION: Right lateral 9th and 10th rib fractures.   Electronically Signed   By: Julian Hy M.D.   On: 07/15/2014 20:12    Scheduled Meds: . antiseptic oral rinse  7 mL Mouth Rinse BID  . cefTRIAXone (ROCEPHIN)  IV  1 g Intravenous Q24H  . insulin aspart  0-9 Units Subcutaneous TID WC  . insulin glargine  45 Units Subcutaneous QHS  . latanoprost  1 drop Both Eyes QHS  . pravastatin  10 mg Oral QHS  . sodium chloride  3 mL Intravenous Q12H  . Warfarin - Pharmacist Dosing  Inpatient   Does not apply q1800   Continuous Infusions: . sodium chloride 75 mL/hr at 07/15/14 2331   Antibiotics Given (last 72 hours)    None      Principal Problem:   Hyperglycemia Active Problems:   Diabetes mellitus without complication   Atrial fibrillation   Hx of radiation therapy   UTI (lower urinary tract infection)   Fall at home   Closed rib fracture   Dehydration, mild    Time spent: 35 min    Mariaeduarda Defranco  [facility] Pager [phone] If 7PM-7AM, please contact night-coverage at [email or web address], password [facility] 07/16/2014, 10:18 AM  LOS: 1 day

## 2014-07-17 DIAGNOSIS — W19XXXS Unspecified fall, sequela: Secondary | ICD-10-CM

## 2014-07-17 LAB — BASIC METABOLIC PANEL
Anion gap: 8 (ref 5–15)
BUN: 17 mg/dL (ref 6–20)
CO2: 20 mmol/L — AB (ref 22–32)
CREATININE: 0.95 mg/dL (ref 0.61–1.24)
Calcium: 8.3 mg/dL — ABNORMAL LOW (ref 8.9–10.3)
Chloride: 106 mmol/L (ref 101–111)
GFR calc Af Amer: >60 mL/min (ref >60)
GFR calc non Af Amer: >60 mL/min (ref >60)
GLUCOSE: 172 mg/dL — AB (ref 65–99)
Potassium: 3.8 mmol/L (ref 3.5–5.1)
SODIUM: 134 mmol/L — AB (ref 135–145)

## 2014-07-17 LAB — PROTIME-INR
INR: 1.36 (ref 0.00–1.49)
PROTHROMBIN TIME: 16.9 s — AB (ref 11.6–15.2)

## 2014-07-17 LAB — CBC
HCT: 40.3 % (ref 39.0–52.0)
HEMOGLOBIN: 14 g/dL (ref 13.0–17.0)
MCH: 29.7 pg (ref 26.0–34.0)
MCHC: 34.7 g/dL (ref 30.0–36.0)
MCV: 85.4 fL (ref 78.0–100.0)
Platelets: 210 10*3/uL (ref 150–400)
RBC: 4.72 MIL/uL (ref 4.22–5.81)
RDW: 12.8 % (ref 11.5–15.5)
WBC: 8.3 10*3/uL (ref 4.0–10.5)

## 2014-07-17 LAB — GLUCOSE, CAPILLARY
GLUCOSE-CAPILLARY: 167 mg/dL — AB (ref 65–99)
GLUCOSE-CAPILLARY: 225 mg/dL — AB (ref 65–99)
Glucose-Capillary: 193 mg/dL — ABNORMAL HIGH (ref 65–99)
Glucose-Capillary: 191 mg/dL — ABNORMAL HIGH (ref 65–99)

## 2014-07-17 MED ORDER — METFORMIN HCL 500 MG PO TABS
500.0000 mg | ORAL_TABLET | Freq: Two times a day (BID) | ORAL | Status: DC
  Administered 2014-07-17 – 2014-07-18 (×3): 500 mg via ORAL
  Filled 2014-07-17 (×5): qty 1

## 2014-07-17 MED ORDER — GLYBURIDE 2.5 MG PO TABS
2.5000 mg | ORAL_TABLET | Freq: Two times a day (BID) | ORAL | Status: DC
  Administered 2014-07-17 – 2014-07-18 (×3): 2.5 mg via ORAL
  Filled 2014-07-17 (×5): qty 1

## 2014-07-17 MED ORDER — WARFARIN SODIUM 10 MG PO TABS
10.0000 mg | ORAL_TABLET | Freq: Once | ORAL | Status: AC
  Administered 2014-07-17: 10 mg via ORAL
  Filled 2014-07-17: qty 1

## 2014-07-17 MED ORDER — SODIUM CHLORIDE 0.9 % IV SOLN: INTRAVENOUS | Status: DC

## 2014-07-17 MED ORDER — GLYBURIDE-METFORMIN 2.5-500 MG PO TABS: 1.0000 | ORAL_TABLET | Freq: Two times a day (BID) | ORAL | Status: DC

## 2014-07-17 NOTE — Progress Notes (Signed)
PROGRESS NOTE  Charles Friedman [id number] DOB: 1932/02/19 DOA: 07/15/2014 PCP: Haywood Pao, MD  Assessment/Plan: Hyperglycemia-  -s/p 2 liters ivf -resume lantus - resume PO meds    Diabetes mellitus without complication- As above  Atrial fibrillation- Rate controlled. Pharm consult for coumadin  Hx of radiation therapy- noted  UTI (lower urinary tract infection)- Rocephin iv. Urine culture never sent- treat with rocephin and then change to PO in AM  Fall at home- noted- spoke with family who plans to get more involved in patient's care and can provide supervision  Closed rib fracture multiple- Noted, po norco. Monitor closely for any respiratory compromise overnight.  Dehydration, mild- ivf.  Code Status: full Family Communication: patient/son on phone Disposition Plan:    Consultants:    Procedures:      HPI/Subjective: Blood sugars much better this AM No SOB, pain only with movements in ribs  Objective: Filed Vitals:   07/17/14 0633  BP: 154/95  Pulse:   Temp:   Resp:     Intake/Output Summary (Last 24 hours) at 07/17/14 0955 Last data filed at 07/17/14 0938  Gross per 24 hour  Intake 2521.25 ml  Output   1400 ml  Net 1121.25 ml   Filed Weights   07/15/14 2245 07/16/14 0314 07/17/14 0508  Weight: 118.978 kg (262 lb 4.8 oz) 119.296 kg (263 lb) 103.42 kg (228 lb)    Exam:   General:  A+Ox3, NAD  Cardiovascular: rrr  Respiratory: clear  Abdomen: +BS, soft  Musculoskeletal: no edema   Data Reviewed: Basic Metabolic Panel:  Recent Labs Lab 07/15/14 1935 07/16/14 0001 07/16/14 0526 07/17/14 0549  NA 131* 134* 134* 134*  K 5.1 4.4 4.5 3.8  CL 98* 103 104 106  CO2 18* 20* 20* 20*  GLUCOSE 564* 380* 363* 172*  BUN 26* 23* 23* 17  CREATININE 1.39* 1.27* 1.18 0.95  CALCIUM 9.0 8.7* 8.8* 8.3*   Liver Function Tests: No results for input(s): AST, ALT, ALKPHOS, BILITOT, PROT, ALBUMIN in the last 168 hours. No results  for input(s): LIPASE, AMYLASE in the last 168 hours. No results for input(s): AMMONIA in the last 168 hours. CBC:  Recent Labs Lab 07/15/14 1935 07/16/14 0526 07/17/14 0549  WBC 11.6* 11.3* 8.3  NEUTROABS 9.8*  --   --   HGB 15.5 15.1 14.0  HCT 43.3 42.8 40.3  MCV 84.9 85.8 85.4  PLT 231 237 210   Cardiac Enzymes: No results for input(s): CKTOTAL, CKMB, CKMBINDEX, TROPONINI in the last 168 hours. BNP (last 3 results) No results for input(s): BNP in the last 8760 hours.  ProBNP (last 3 results) No results for input(s): PROBNP in the last 8760 hours.  CBG:  Recent Labs Lab 07/16/14 0748 07/16/14 1207 07/16/14 1640 07/16/14 2108 07/17/14 0803  GLUCAP 316* 337* 338* 258* 167*    No results found for this or any previous visit (from the past 240 hour(s)).   Studies: Dg Ribs Unilateral W/chest Right  07/15/2014   CLINICAL DATA:  Fall, right posterior rib pain  EXAM: RIGHT RIBS AND CHEST - 3+ VIEW  COMPARISON:  None.  FINDINGS: Lungs are clear.  No pleural effusion or pneumothorax.  The heart is top-normal in size.  Right lateral 9th and 10th rib fractures.  IMPRESSION: Right lateral 9th and 10th rib fractures.   Electronically Signed   By: Julian Hy M.D.   On: 07/15/2014 20:12    Scheduled Meds: . antiseptic oral rinse  7 mL Mouth Rinse  BID  . cefTRIAXone (ROCEPHIN)  IV  1 g Intravenous Q24H  . enoxaparin (LOVENOX) injection  60 mg Subcutaneous Q24H  . metFORMIN  500 mg Oral BID WC   And  . glyBURIDE  2.5 mg Oral BID WC  . insulin aspart  0-9 Units Subcutaneous TID WC  . insulin glargine  45 Units Subcutaneous QHS  . latanoprost  1 drop Both Eyes QHS  . pravastatin  10 mg Oral QHS  . sodium chloride  3 mL Intravenous Q12H  . Warfarin - Pharmacist Dosing Inpatient   Does not apply q1800   Continuous Infusions:   Antibiotics Given (last 72 hours)    None      Principal Problem:   Hyperglycemia Active Problems:   Diabetes mellitus without  complication   Atrial fibrillation   Hx of radiation therapy   UTI (lower urinary tract infection)   Fall at home   Closed rib fracture   Dehydration, mild    Time spent: 25 min    Charles Friedman  [facility] Pager [phone] If 7PM-7AM, please contact night-coverage at [email or web address], password [facility] 07/17/2014, 9:55 AM  LOS: 2 days

## 2014-07-17 NOTE — Clinical Documentation Improvement (Signed)
"  mild acidosis" was documented in the H&P. Could you please help by clarifying the type of acidosis.   Thank you for your time with this!   Almon Register, RN Clinical Documentation Improvement Specialist (CDIS) [phone] / [phone]

## 2014-07-17 NOTE — Progress Notes (Addendum)
Physical Therapy Treatment Patient Details Name: Charles Friedman MRN: [id number] DOB: [birth year] Today's Date: 07/17/2014    History of Present Illness Pt adm after fall at home and has rt rib fx's. Pt also with hyperglycemia and UTI.  PMH - DM, prostate CA, afib, neuropathy    PT Comments    Pt improving steadily. Pt vague about if family can provide initial assistance at home. Asked him about willingness to go to ST-SNF but he stated he wants to try home. Recommend 3-in-1 for home but pt unsure if he wants this and if it will fit around his commode.  Follow Up Recommendations  Home health PT;Supervision/Assistance - 24 hour (for initial few days)     Equipment Recommendations  3in1 (PT) (If pt agreeable)    Recommendations for Other Services OT consult     Precautions / Restrictions Precautions Precautions: Fall Restrictions Weight Bearing Restrictions: No    Mobility  Bed Mobility Overal bed mobility: Needs Assistance Bed Mobility: Supine to Sit     Supine to sit: Supervision;HOB elevated     General bed mobility comments: Incr time and effort  Transfers Overall transfer level: Needs assistance Equipment used: Rolling walker (2 wheeled) Transfers: Sit to/from Stand Sit to Stand: Supervision         General transfer comment: Pt used hands on walker to come to stand to reduce rib pain  Ambulation/Gait Ambulation/Gait assistance: Supervision Ambulation Distance (Feet): 170 Feet Assistive device: Rolling walker (2 wheeled) Gait Pattern/deviations: Step-through pattern;Decreased step length - right;Decreased step length - left Gait velocity: decr Gait velocity interpretation: Below normal speed for age/gender General Gait Details: Slow, measured gait. HR to 160 with amb. SaO2 98% on RA with amb.   Stairs            Wheelchair Mobility    Modified Rankin (Stroke Patients Only)       Balance     Sitting balance-Leahy Scale: Good       Standing  balance-Leahy Scale: Poor Standing balance comment: support of walker                    Cognition Arousal/Alertness: Awake/alert Behavior During Therapy: WFL for tasks assessed/performed Overall Cognitive Status: Within Functional Limits for tasks assessed       Memory: Decreased short-term memory              Exercises      General Comments        Pertinent Vitals/Pain Pain Assessment: Faces Faces Pain Scale: Hurts little more Pain Location: rt ribs Pain Intervention(s): Limited activity within patient's tolerance;Repositioned    Home Living                      Prior Function            PT Goals (current goals can now be found in the care plan section) Acute Rehab PT Goals Patient Stated Goal: Return home PT Goal Formulation: With patient Time For Goal Achievement: 07/23/14 Potential to Achieve Goals: Good    Frequency  Min 3X/week    PT Plan Current plan remains appropriate (if family can provide initial support)    Co-evaluation             End of Session   Activity Tolerance: Patient tolerated treatment well Patient left: in chair;with call bell/phone within reach;with chair alarm set     Time: 0832-0900 PT Time Calculation (min) (ACUTE ONLY): 28 min  Charges:  $Gait Training: 23-37 mins                    G Codes:      Brigitta Pricer July 28, 2014, 9:23 AM  [facility] PT [phone]

## 2014-07-17 NOTE — Progress Notes (Signed)
[place] for Warfarin Indication: atrial fibrillation  Allergies  Allergen Reactions  . Aureomycin [Chlortetracycline] Itching and Rash    Reaction approximately 1956    Patient Measurements: Height: 6\' 1"  (185.4 cm) Weight: 228 lb (103.42 kg) IBW/kg (Calculated) : 79.9   Vital Signs: Temp: 98 F (36.7 C) (05/30 0508) Temp Source: Oral (05/30 0508) BP: 154/95 mmHg (05/30 0633)  Labs:  Recent Labs  07/15/14 1935 07/16/14 0001 07/16/14 0526 07/17/14 0549  HGB 15.5  --  15.1 14.0  HCT 43.3  --  42.8 40.3  PLT 231  --  237 210  LABPROT 19.4*  --  18.0* 16.9*  INR 1.63*  --  1.48 1.36  CREATININE 1.39* 1.27* 1.18 0.95    Estimated Creatinine Clearance: 79.6 mL/min (by C-G formula based on Cr of 0.95).   Assessment: 81yo male with history of Afib and DM2 presents after fall. Pharmacy consulted to dose warfarin for atrial fibrillation. INR on admit 1.63. INR is 1.36 today, below goal.  CBC WNL, no bleeding noted.   PTA warfarin dose: 7.5mg  MWF and 5mg  AODs with last dose taken 07/15/14  Goal of Therapy:  INR 2-3   Plan:  - Coumadin 10mg  po x 1 - Continue LMWH 60 mg (0.5 mg/kg for VTE px in obesity) until INR > = 2 - Daily INR - Monitor s/sx of bleeding  Maryanna Shape, PharmD, BCPS  Clinical Pharmacist  Pager: [phone]   07/17/2014 10:44 AM

## 2014-07-18 DIAGNOSIS — I482 Chronic atrial fibrillation: Secondary | ICD-10-CM

## 2014-07-18 LAB — PROTIME-INR
INR: 1.63 — ABNORMAL HIGH (ref 0.00–1.49)
Prothrombin Time: 19.3 seconds — ABNORMAL HIGH (ref 11.6–15.2)

## 2014-07-18 LAB — CBC
HCT: 40.3 % (ref 39.0–52.0)
HEMOGLOBIN: 14.2 g/dL (ref 13.0–17.0)
MCH: 30 pg (ref 26.0–34.0)
MCHC: 35.2 g/dL (ref 30.0–36.0)
MCV: 85 fL (ref 78.0–100.0)
Platelets: 216 10*3/uL (ref 150–400)
RBC: 4.74 MIL/uL (ref 4.22–5.81)
RDW: 12.8 % (ref 11.5–15.5)
WBC: 7.5 10*3/uL (ref 4.0–10.5)

## 2014-07-18 LAB — GLUCOSE, CAPILLARY
Glucose-Capillary: 170 mg/dL — ABNORMAL HIGH (ref 65–99)
Glucose-Capillary: 128 mg/dL — ABNORMAL HIGH (ref 65–99)

## 2014-07-18 MED ORDER — CEFUROXIME AXETIL 500 MG PO TABS: 500.0000 mg | ORAL_TABLET | Freq: Two times a day (BID) | ORAL | Status: DC

## 2014-07-18 MED ORDER — HYDROCODONE-ACETAMINOPHEN 5-325 MG PO TABS: 1.0000 | ORAL_TABLET | ORAL | Status: DC | PRN

## 2014-07-18 MED ORDER — WARFARIN SODIUM 10 MG PO TABS
10.0000 mg | ORAL_TABLET | Freq: Once | ORAL | Status: DC
  Filled 2014-07-18: qty 1

## 2014-07-18 MED ORDER — METOPROLOL TARTRATE 25 MG PO TABS: 12.5000 mg | ORAL_TABLET | Freq: Two times a day (BID) | ORAL | Status: DC

## 2014-07-18 MED ORDER — METOPROLOL TARTRATE 25 MG PO TABS
25.0000 mg | ORAL_TABLET | Freq: Two times a day (BID) | ORAL | Status: DC
  Administered 2014-07-18: 25 mg via ORAL
  Filled 2014-07-18 (×2): qty 1

## 2014-07-18 NOTE — Discharge Instructions (Signed)

## 2014-07-18 NOTE — Progress Notes (Signed)
ANTICOAGULATION CONSULT NOTE - Follow Up Consult  Pharmacy Consult for Coumadin Indication: atrial fibrillation  Allergies  Allergen Reactions  . Aureomycin [Chlortetracycline] Itching and Rash    Reaction approximately 1956    Patient Measurements: Height: 6\' 1"  (185.4 cm) Weight: 227 lb 15.3 oz (103.4 kg) IBW/kg (Calculated) : 79.9 Heparin Dosing Weight:   Vital Signs: Temp: 97.9 F (36.6 C) (05/31 0629) Temp Source: Oral (05/31 0629) BP: 126/83 mmHg (05/31 0629) Pulse Rate: 108 (05/31 0629)  Labs:  Recent Labs  07/16/14 0001 07/16/14 0526 07/17/14 0549 07/18/14 0505  HGB  --  15.1 14.0 14.2  HCT  --  42.8 40.3 40.3  PLT  --  237 210 216  LABPROT  --  18.0* 16.9* 19.3*  INR  --  1.48 1.36 1.63*  CREATININE 1.27* 1.18 0.95  --     Estimated Creatinine Clearance: 79.6 mL/min (by C-G formula based on Cr of 0.95).   Medications:  Scheduled:  . antiseptic oral rinse  7 mL Mouth Rinse BID  . cefTRIAXone (ROCEPHIN)  IV  1 g Intravenous Q24H  . enoxaparin (LOVENOX) injection  60 mg Subcutaneous Q24H  . metFORMIN  500 mg Oral BID WC   And  . glyBURIDE  2.5 mg Oral BID WC  . insulin aspart  0-9 Units Subcutaneous TID WC  . insulin glargine  45 Units Subcutaneous QHS  . latanoprost  1 drop Both Eyes QHS  . metoprolol tartrate  25 mg Oral BID  . pravastatin  10 mg Oral QHS  . sodium chloride  3 mL Intravenous Q12H  . warfarin  10 mg Oral ONCE-1800  . Warfarin - Pharmacist Dosing Inpatient   Does not apply q1800    Assessment: 81yo male with AFib.  INR 1.63 this AM- approaching goal range.  Hg wnl and no bleeding noted.  Appears that pt may be going home today.  Goal of Therapy:  INR 2-3 Monitor platelets by anticoagulation protocol: Yes   Plan:  Coumadin 10mg  today   Gracy Bruins, PharmD Clinical Pharmacist [facility]

## 2014-07-18 NOTE — Progress Notes (Addendum)
MEDICATION RELATED NOTE  Pharmacy Re:  Home Medications    Assessment: Attempts to clarify the medications above were made through various means including retail, a friend and son.  Patient has not been able to validate any of them at this time.   Plan:  Will mark history as complete at this time.  Should changes to therapy be identified, please let pharmacy know and we can update his records.  Rober Minion, PharmD., MS Clinical Pharmacist Pager:  [phone] Thank you for allowing pharmacy to be part of this patients care team. 07/18/2014,3:43 PM

## 2014-07-18 NOTE — Discharge Summary (Signed)
Physician Discharge Summary  Charles Friedman AUQ:[id number] DOB: May 07, 1932 DOA: 07/15/2014  PCP: Haywood Pao, MD  Admit date: 07/15/2014 Discharge date: 07/18/2014  Time spent: 35 minutes  Recommendations for Outpatient Follow-up:  Home health and supervision by family Check blood sugars and bring to PCP Continue incentive spirometry Hold coumadin per eye doctor for upcoming surgery PT/inr on Friday    Discharge Diagnoses:  Principal Problem:   Hyperglycemia Active Problems:   Diabetes mellitus without complication   Atrial fibrillation   Hx of radiation therapy   UTI (lower urinary tract infection)   Fall at home   Closed rib fracture   Dehydration, mild   Discharge Condition: improved  Diet recommendation: cardiac/diabetic  Filed Weights   07/16/14 0314 07/17/14 0508 07/18/14 0500  Weight: 119.296 kg (263 lb) 103.42 kg (228 lb) 103.4 kg (227 lb 15.3 oz)    History of present illness:  81 yo male h/o IDDM, afib, prostate cancer s/p XRT comes in after several days of increased urinary freq. Saw his urologist earlier in the week and started on flomax, no abx. Today pt was getting up from toilet when he lost his balance and fell onto the bathtub hurting his ribs. He has not taken his lantus in 3 days because he says he just being lazy. No fevers at home. No n/v/d. Ribs hurt when deep breathing. No pain elsewhere. Referred for admission for glucose over 500, uti and dehydration.   Hospital Course:  Hyperglycemia- patient was not taking meds -s/p 2 liters ivf -resume lantus - resume PO meds    Diabetes mellitus without complication- As above  Atrial fibrillation- add BB for rate control. coumadin- will defer fall risk to PCP after home health evaluated  Hx of radiation therapy- noted  UTI (lower urinary tract infection)-   Urine culture never sent- treated with rocephin and changed to PO  Fall at home- noted- spoke with family who plans to get more  involved in patient's care and can provide supervision  Closed rib fracture multiple- Noted, po norco.   Dehydration, mild- ivf.  Procedures:    Consultations:  PT  Discharge Exam: Filed Vitals:   07/18/14 0629  BP: 126/83  Pulse: 108  Temp: 97.9 F (36.6 C)  Resp: 18    General: pleasant/cooperative Cardiovascular: irr Respiratory: clear  Discharge Instructions   Discharge Instructions    Diet - low sodium heart healthy    Complete by:  As directed      Diet Carb Modified    Complete by:  As directed      Discharge instructions    Complete by:  As directed   Home health and supervision by family Check blood sugars and bring to PCP Continue incentive spirometry Hold coumadin per eye doctor for upcoming surgery PT/inr on Friday     Increase activity slowly    Complete by:  As directed           Current Discharge Medication List    START taking these medications   Details  cefUROXime (CEFTIN) 500 MG tablet Take 1 tablet (500 mg total) by mouth 2 (two) times daily with a meal. Qty: 10 tablet, Refills: 0    HYDROcodone-acetaminophen (NORCO/VICODIN) 5-325 MG per tablet Take 1-2 tablets by mouth every 4 (four) hours as needed for moderate pain. Qty: 15 tablet, Refills: 0    metoprolol tartrate (LOPRESSOR) 25 MG tablet Take 0.5 tablets (12.5 mg total) by mouth 2 (two) times daily. Qty: 30 tablet, Refills:  0      CONTINUE these medications which have NOT CHANGED   Details  glyBURIDE-metformin (GLUCOVANCE) 2.5-500 MG per tablet Take 1 tablet by mouth 2 (two) times daily with a meal.     insulin glargine (LANTUS) 100 UNIT/ML injection Inject 45 Units into the skin at bedtime.     latanoprost (XALATAN) 0.005 % ophthalmic solution Place 1 drop into both eyes at bedtime.     pravastatin (PRAVACHOL) 10 MG tablet Take 10 mg by mouth at bedtime.     warfarin (COUMADIN) 5 MG tablet Take 5 mg by mouth daily with breakfast. Hx A-Fib, per coumadin pharmacy  protocol Take 1 tablet (5 mg) on Sunday, Tuesday, Thursday, Saturday; take 1 1/2 tablet (7.5 mg) Monday, Wednesday, Friday    BESIVANCE 0.6 % SUSP Place 1 drop into the left eye 3 (three) times daily.     DUREZOL 0.05 % EMUL Place 1 drop into the left eye 3 (three) times daily.     ILEVRO 0.3 % ophthalmic suspension Place 1 drop into the left eye at bedtime.        Allergies  Allergen Reactions  . Aureomycin [Chlortetracycline] Itching and Rash    Reaction approximately 1956   Follow-up Information    Follow up with [facility].   Why:  HHRN, HHPT   Contact information:   [address] [place] [address] [phone]        The results of significant diagnostics from this hospitalization (including imaging, microbiology, ancillary and laboratory) are listed below for reference.    Significant Diagnostic Studies: Dg Ribs Unilateral W/chest Right  07/15/2014   CLINICAL DATA:  Fall, right posterior rib pain  EXAM: RIGHT RIBS AND CHEST - 3+ VIEW  COMPARISON:  None.  FINDINGS: Lungs are clear.  No pleural effusion or pneumothorax.  The heart is top-normal in size.  Right lateral 9th and 10th rib fractures.  IMPRESSION: Right lateral 9th and 10th rib fractures.   Electronically Signed   By: Julian Hy M.D.   On: 07/15/2014 20:12    Microbiology: No results found for this or any previous visit (from the past 240 hour(s)).   Labs: Basic Metabolic Panel:  Recent Labs Lab 07/15/14 1935 07/16/14 0001 07/16/14 0526 07/17/14 0549  NA 131* 134* 134* 134*  K 5.1 4.4 4.5 3.8  CL 98* 103 104 106  CO2 18* 20* 20* 20*  GLUCOSE 564* 380* 363* 172*  BUN 26* 23* 23* 17  CREATININE 1.39* 1.27* 1.18 0.95  CALCIUM 9.0 8.7* 8.8* 8.3*   Liver Function Tests: No results for input(s): AST, ALT, ALKPHOS, BILITOT, PROT, ALBUMIN in the last 168 hours. No results for input(s): LIPASE, AMYLASE in the last 168 hours. No results for input(s): AMMONIA in the  last 168 hours. CBC:  Recent Labs Lab 07/15/14 1935 07/16/14 0526 07/17/14 0549 07/18/14 0505  WBC 11.6* 11.3* 8.3 7.5  NEUTROABS 9.8*  --   --   --   HGB 15.5 15.1 14.0 14.2  HCT 43.3 42.8 40.3 40.3  MCV 84.9 85.8 85.4 85.0  PLT 231 237 210 216   Cardiac Enzymes: No results for input(s): CKTOTAL, CKMB, CKMBINDEX, TROPONINI in the last 168 hours. BNP: BNP (last 3 results) No results for input(s): BNP in the last 8760 hours.  ProBNP (last 3 results) No results for input(s): PROBNP in the last 8760 hours.  CBG:  Recent Labs Lab 07/17/14 1150 07/17/14 1728 07/17/14 2144 07/18/14 0754 07/18/14 1155  GLUCAP 225* 193* 191* 170* 128*       Signed:  Efstathios Sawin  [facility] 07/18/2014, 12:16 PM

## 2014-07-18 NOTE — Progress Notes (Signed)
Physical Therapy Treatment Patient Details Name: Charles Friedman MRN: [id number] DOB: May 08, 1932 Today's Date: 07/18/2014    History of Present Illness Pt adm after fall at home and has rt rib fx's. Pt also with hyperglycemia and UTI.  PMH - DM, prostate CA, afib, neuropathy    PT Comments    Patient continues to progress with therapy. Stated that son and other friends will be checking in on him at home.   Follow Up Recommendations  Home health PT;Supervision/Assistance - 24 hour     Equipment Recommendations  3in1 (PT) (if patient agreeable)    Recommendations for Other Services       Precautions / Restrictions Precautions Precautions: Fall    Mobility  Bed Mobility         Supine to sit: Supervision;HOB elevated     General bed mobility comments: Incr time and effort  Transfers Overall transfer level: Needs assistance Equipment used: Rolling walker (2 wheeled)   Sit to Stand: Supervision         General transfer comment: Pt used hands on walker to come to stand to reduce rib pain  Ambulation/Gait Ambulation/Gait assistance: Supervision Ambulation Distance (Feet): 180 Feet Assistive device: Rolling walker (2 wheeled) Gait Pattern/deviations: Step-through pattern;Decreased stride length   Gait velocity interpretation: Below normal speed for age/gender General Gait Details: Slow, measured gait. HR to 147 with amb. SaO2 98% on RA with amb. patient understands he has to take time to increase safety   Stairs            Wheelchair Mobility    Modified Rankin (Stroke Patients Only)       Balance                                    Cognition Arousal/Alertness: Awake/alert Behavior During Therapy: WFL for tasks assessed/performed Overall Cognitive Status: Within Functional Limits for tasks assessed       Memory: Decreased short-term memory              Exercises      General Comments        Pertinent Vitals/Pain Faces  Pain Scale: Hurts little more Pain Location: ribs Pain Intervention(s): Limited activity within patient's tolerance    Home Living                      Prior Function            PT Goals (current goals can now be found in the care plan section) Progress towards PT goals: Progressing toward goals    Frequency  Min 3X/week    PT Plan Current plan remains appropriate    Co-evaluation             End of Session   Activity Tolerance: Patient tolerated treatment well Patient left: in chair;with call bell/phone within reach;with nursing/sitter in room     Time: 1140-1201 PT Time Calculation (min) (ACUTE ONLY): 21 min  Charges:  $Gait Training: 8-22 mins                    G Codes:      Jacqualyn Posey 07/18/2014, 1:21 PM  07/18/2014 Jacqualyn Posey PTA [phone] pager [phone] office

## 2014-07-18 NOTE — Care Management Note (Signed)
Case Management Note  Patient Details  Name: Charles Friedman MRN: [id number] Date of Birth: 03-31-1932  Subjective/Objective:       Patient is for dc today, he chose [facility] for [facility], HHPT, referral made to [facility], [place] notified.  Soc will begin 24-48 hrs post dc. Patient states he does not need a 3 n 1.               Action/Plan: Home with [facility] services with [facility].  Expected Discharge Date:                  Expected Discharge Plan:  [place]  In-House Referral:     Discharge planning Services  CM Consult  Post Acute Care Choice:    Choice offered to:  Patient  DME Arranged:    DME Agency:     HH Arranged:  RN, PT [place] Agency:  [place]  Status of Service:  Completed, signed off  Medicare Important Message Given:  Yes Date Medicare IM Given:  07/17/14 Medicare IM give by:  debbie swist rn bsn cm Date Additional Medicare IM Given:    Additional Medicare Important Message give by:     If discussed at [place] of Stay Meetings, dates discussed:    Additional Comments:  Zenon Mayo, RN 07/18/2014, 11:03 AM

## 2014-07-18 NOTE — Progress Notes (Signed)
Patient was discharged home by MD order; discharged instructions  review and give to patient and his son with care notes and prescriptions; IV DIC; skin intact; patient will be escorted to the car by nurse tech via wheelchair.

## 2014-07-19 DIAGNOSIS — Z5181 Encounter for therapeutic drug level monitoring: Secondary | ICD-10-CM | POA: Diagnosis not present

## 2014-07-19 DIAGNOSIS — N390 Urinary tract infection, site not specified: Secondary | ICD-10-CM | POA: Diagnosis not present

## 2014-07-19 DIAGNOSIS — Z8546 Personal history of malignant neoplasm of prostate: Secondary | ICD-10-CM | POA: Diagnosis not present

## 2014-07-19 DIAGNOSIS — E1142 Type 2 diabetes mellitus with diabetic polyneuropathy: Secondary | ICD-10-CM | POA: Diagnosis not present

## 2014-07-19 DIAGNOSIS — E1165 Type 2 diabetes mellitus with hyperglycemia: Secondary | ICD-10-CM | POA: Diagnosis not present

## 2014-07-19 DIAGNOSIS — Z7901 Long term (current) use of anticoagulants: Secondary | ICD-10-CM | POA: Diagnosis not present

## 2014-07-19 DIAGNOSIS — S2241XD Multiple fractures of ribs, right side, subsequent encounter for fracture with routine healing: Secondary | ICD-10-CM | POA: Diagnosis not present

## 2014-07-19 DIAGNOSIS — I4891 Unspecified atrial fibrillation: Secondary | ICD-10-CM | POA: Diagnosis not present

## 2014-07-19 DIAGNOSIS — Z9181 History of falling: Secondary | ICD-10-CM | POA: Diagnosis not present

## 2014-07-19 LAB — HEMOGLOBIN A1C
Hgb A1c MFr Bld: 9.6 % — ABNORMAL HIGH (ref 4.8–5.6)
MEAN PLASMA GLUCOSE: 229 mg/dL

## 2014-07-20 DIAGNOSIS — Z5181 Encounter for therapeutic drug level monitoring: Secondary | ICD-10-CM | POA: Diagnosis not present

## 2014-07-20 DIAGNOSIS — N390 Urinary tract infection, site not specified: Secondary | ICD-10-CM | POA: Diagnosis not present

## 2014-07-20 DIAGNOSIS — I4891 Unspecified atrial fibrillation: Secondary | ICD-10-CM | POA: Diagnosis not present

## 2014-07-20 DIAGNOSIS — E1165 Type 2 diabetes mellitus with hyperglycemia: Secondary | ICD-10-CM | POA: Diagnosis not present

## 2014-07-20 DIAGNOSIS — S2241XD Multiple fractures of ribs, right side, subsequent encounter for fracture with routine healing: Secondary | ICD-10-CM | POA: Diagnosis not present

## 2014-07-20 DIAGNOSIS — E1142 Type 2 diabetes mellitus with diabetic polyneuropathy: Secondary | ICD-10-CM | POA: Diagnosis not present

## 2014-07-21 DIAGNOSIS — Z5181 Encounter for therapeutic drug level monitoring: Secondary | ICD-10-CM | POA: Diagnosis not present

## 2014-07-21 DIAGNOSIS — E1165 Type 2 diabetes mellitus with hyperglycemia: Secondary | ICD-10-CM | POA: Diagnosis not present

## 2014-07-21 DIAGNOSIS — E1142 Type 2 diabetes mellitus with diabetic polyneuropathy: Secondary | ICD-10-CM | POA: Diagnosis not present

## 2014-07-21 DIAGNOSIS — N390 Urinary tract infection, site not specified: Secondary | ICD-10-CM | POA: Diagnosis not present

## 2014-07-21 DIAGNOSIS — I4891 Unspecified atrial fibrillation: Secondary | ICD-10-CM | POA: Diagnosis not present

## 2014-07-21 DIAGNOSIS — S2241XD Multiple fractures of ribs, right side, subsequent encounter for fracture with routine healing: Secondary | ICD-10-CM | POA: Diagnosis not present

## 2014-07-24 DIAGNOSIS — S2241XD Multiple fractures of ribs, right side, subsequent encounter for fracture with routine healing: Secondary | ICD-10-CM | POA: Diagnosis not present

## 2014-07-24 DIAGNOSIS — E1165 Type 2 diabetes mellitus with hyperglycemia: Secondary | ICD-10-CM | POA: Diagnosis not present

## 2014-07-24 DIAGNOSIS — Z5181 Encounter for therapeutic drug level monitoring: Secondary | ICD-10-CM | POA: Diagnosis not present

## 2014-07-24 DIAGNOSIS — I4891 Unspecified atrial fibrillation: Secondary | ICD-10-CM | POA: Diagnosis not present

## 2014-07-24 DIAGNOSIS — N390 Urinary tract infection, site not specified: Secondary | ICD-10-CM | POA: Diagnosis not present

## 2014-07-24 DIAGNOSIS — E1142 Type 2 diabetes mellitus with diabetic polyneuropathy: Secondary | ICD-10-CM | POA: Diagnosis not present

## 2014-07-26 DIAGNOSIS — E1165 Type 2 diabetes mellitus with hyperglycemia: Secondary | ICD-10-CM | POA: Diagnosis not present

## 2014-07-26 DIAGNOSIS — Z5181 Encounter for therapeutic drug level monitoring: Secondary | ICD-10-CM | POA: Diagnosis not present

## 2014-07-26 DIAGNOSIS — I4891 Unspecified atrial fibrillation: Secondary | ICD-10-CM | POA: Diagnosis not present

## 2014-07-26 DIAGNOSIS — E1142 Type 2 diabetes mellitus with diabetic polyneuropathy: Secondary | ICD-10-CM | POA: Diagnosis not present

## 2014-07-26 DIAGNOSIS — S2241XD Multiple fractures of ribs, right side, subsequent encounter for fracture with routine healing: Secondary | ICD-10-CM | POA: Diagnosis not present

## 2014-07-26 DIAGNOSIS — N390 Urinary tract infection, site not specified: Secondary | ICD-10-CM | POA: Diagnosis not present

## 2014-07-27 DIAGNOSIS — E1165 Type 2 diabetes mellitus with hyperglycemia: Secondary | ICD-10-CM | POA: Diagnosis not present

## 2014-07-27 DIAGNOSIS — E1142 Type 2 diabetes mellitus with diabetic polyneuropathy: Secondary | ICD-10-CM | POA: Diagnosis not present

## 2014-07-27 DIAGNOSIS — Z5181 Encounter for therapeutic drug level monitoring: Secondary | ICD-10-CM | POA: Diagnosis not present

## 2014-07-27 DIAGNOSIS — I4891 Unspecified atrial fibrillation: Secondary | ICD-10-CM | POA: Diagnosis not present

## 2014-07-27 DIAGNOSIS — S2241XD Multiple fractures of ribs, right side, subsequent encounter for fracture with routine healing: Secondary | ICD-10-CM | POA: Diagnosis not present

## 2014-07-27 DIAGNOSIS — N390 Urinary tract infection, site not specified: Secondary | ICD-10-CM | POA: Diagnosis not present

## 2014-08-02 DIAGNOSIS — S2241XD Multiple fractures of ribs, right side, subsequent encounter for fracture with routine healing: Secondary | ICD-10-CM | POA: Diagnosis not present

## 2014-08-02 DIAGNOSIS — Z5181 Encounter for therapeutic drug level monitoring: Secondary | ICD-10-CM | POA: Diagnosis not present

## 2014-08-02 DIAGNOSIS — E1165 Type 2 diabetes mellitus with hyperglycemia: Secondary | ICD-10-CM | POA: Diagnosis not present

## 2014-08-02 DIAGNOSIS — I4891 Unspecified atrial fibrillation: Secondary | ICD-10-CM | POA: Diagnosis not present

## 2014-08-02 DIAGNOSIS — N390 Urinary tract infection, site not specified: Secondary | ICD-10-CM | POA: Diagnosis not present

## 2014-08-02 DIAGNOSIS — E1142 Type 2 diabetes mellitus with diabetic polyneuropathy: Secondary | ICD-10-CM | POA: Diagnosis not present

## 2014-08-03 DIAGNOSIS — E1142 Type 2 diabetes mellitus with diabetic polyneuropathy: Secondary | ICD-10-CM | POA: Diagnosis not present

## 2014-08-03 DIAGNOSIS — I4891 Unspecified atrial fibrillation: Secondary | ICD-10-CM | POA: Diagnosis not present

## 2014-08-03 DIAGNOSIS — E1165 Type 2 diabetes mellitus with hyperglycemia: Secondary | ICD-10-CM | POA: Diagnosis not present

## 2014-08-03 DIAGNOSIS — S2241XD Multiple fractures of ribs, right side, subsequent encounter for fracture with routine healing: Secondary | ICD-10-CM | POA: Diagnosis not present

## 2014-08-03 DIAGNOSIS — Z5181 Encounter for therapeutic drug level monitoring: Secondary | ICD-10-CM | POA: Diagnosis not present

## 2014-08-03 DIAGNOSIS — N390 Urinary tract infection, site not specified: Secondary | ICD-10-CM | POA: Diagnosis not present

## 2014-08-04 DIAGNOSIS — S2241XD Multiple fractures of ribs, right side, subsequent encounter for fracture with routine healing: Secondary | ICD-10-CM | POA: Diagnosis not present

## 2014-08-04 DIAGNOSIS — Z5181 Encounter for therapeutic drug level monitoring: Secondary | ICD-10-CM | POA: Diagnosis not present

## 2014-08-04 DIAGNOSIS — E1142 Type 2 diabetes mellitus with diabetic polyneuropathy: Secondary | ICD-10-CM | POA: Diagnosis not present

## 2014-08-04 DIAGNOSIS — I4891 Unspecified atrial fibrillation: Secondary | ICD-10-CM | POA: Diagnosis not present

## 2014-08-04 DIAGNOSIS — N390 Urinary tract infection, site not specified: Secondary | ICD-10-CM | POA: Diagnosis not present

## 2014-08-04 DIAGNOSIS — E1165 Type 2 diabetes mellitus with hyperglycemia: Secondary | ICD-10-CM | POA: Diagnosis not present

## 2014-08-08 DIAGNOSIS — E1142 Type 2 diabetes mellitus with diabetic polyneuropathy: Secondary | ICD-10-CM | POA: Diagnosis not present

## 2014-08-08 DIAGNOSIS — E1165 Type 2 diabetes mellitus with hyperglycemia: Secondary | ICD-10-CM | POA: Diagnosis not present

## 2014-08-08 DIAGNOSIS — S2241XD Multiple fractures of ribs, right side, subsequent encounter for fracture with routine healing: Secondary | ICD-10-CM | POA: Diagnosis not present

## 2014-08-08 DIAGNOSIS — Z5181 Encounter for therapeutic drug level monitoring: Secondary | ICD-10-CM | POA: Diagnosis not present

## 2014-08-08 DIAGNOSIS — N390 Urinary tract infection, site not specified: Secondary | ICD-10-CM | POA: Diagnosis not present

## 2014-08-08 DIAGNOSIS — I4891 Unspecified atrial fibrillation: Secondary | ICD-10-CM | POA: Diagnosis not present

## 2014-08-10 DIAGNOSIS — N390 Urinary tract infection, site not specified: Secondary | ICD-10-CM | POA: Diagnosis not present

## 2014-08-10 DIAGNOSIS — I4891 Unspecified atrial fibrillation: Secondary | ICD-10-CM | POA: Diagnosis not present

## 2014-08-10 DIAGNOSIS — E1142 Type 2 diabetes mellitus with diabetic polyneuropathy: Secondary | ICD-10-CM | POA: Diagnosis not present

## 2014-08-10 DIAGNOSIS — S2241XD Multiple fractures of ribs, right side, subsequent encounter for fracture with routine healing: Secondary | ICD-10-CM | POA: Diagnosis not present

## 2014-08-10 DIAGNOSIS — E1165 Type 2 diabetes mellitus with hyperglycemia: Secondary | ICD-10-CM | POA: Diagnosis not present

## 2014-08-10 DIAGNOSIS — Z5181 Encounter for therapeutic drug level monitoring: Secondary | ICD-10-CM | POA: Diagnosis not present

## 2014-08-14 DIAGNOSIS — H25042 Posterior subcapsular polar age-related cataract, left eye: Secondary | ICD-10-CM | POA: Diagnosis not present

## 2014-08-14 DIAGNOSIS — H25012 Cortical age-related cataract, left eye: Secondary | ICD-10-CM | POA: Diagnosis not present

## 2014-08-14 DIAGNOSIS — H2512 Age-related nuclear cataract, left eye: Secondary | ICD-10-CM | POA: Diagnosis not present

## 2014-08-15 DIAGNOSIS — H25812 Combined forms of age-related cataract, left eye: Secondary | ICD-10-CM | POA: Diagnosis not present

## 2014-08-15 DIAGNOSIS — Z961 Presence of intraocular lens: Secondary | ICD-10-CM | POA: Diagnosis not present

## 2014-08-15 DIAGNOSIS — H2511 Age-related nuclear cataract, right eye: Secondary | ICD-10-CM | POA: Diagnosis not present

## 2014-09-04 DIAGNOSIS — H25811 Combined forms of age-related cataract, right eye: Secondary | ICD-10-CM | POA: Diagnosis not present

## 2014-09-04 DIAGNOSIS — H2511 Age-related nuclear cataract, right eye: Secondary | ICD-10-CM | POA: Diagnosis not present

## 2014-09-07 DIAGNOSIS — L603 Nail dystrophy: Secondary | ICD-10-CM | POA: Diagnosis not present

## 2014-09-07 DIAGNOSIS — I739 Peripheral vascular disease, unspecified: Secondary | ICD-10-CM | POA: Diagnosis not present

## 2014-09-07 DIAGNOSIS — E1051 Type 1 diabetes mellitus with diabetic peripheral angiopathy without gangrene: Secondary | ICD-10-CM | POA: Diagnosis not present

## 2014-11-02 DIAGNOSIS — N182 Chronic kidney disease, stage 2 (mild): Secondary | ICD-10-CM | POA: Diagnosis not present

## 2014-11-02 DIAGNOSIS — E1129 Type 2 diabetes mellitus with other diabetic kidney complication: Secondary | ICD-10-CM | POA: Diagnosis not present

## 2014-11-02 DIAGNOSIS — R809 Proteinuria, unspecified: Secondary | ICD-10-CM | POA: Diagnosis not present

## 2014-11-02 DIAGNOSIS — Z9114 Patient's other noncompliance with medication regimen: Secondary | ICD-10-CM | POA: Diagnosis not present

## 2014-11-02 DIAGNOSIS — Z23 Encounter for immunization: Secondary | ICD-10-CM | POA: Diagnosis not present

## 2014-11-02 DIAGNOSIS — Z7901 Long term (current) use of anticoagulants: Secondary | ICD-10-CM | POA: Diagnosis not present

## 2014-11-02 DIAGNOSIS — I10 Essential (primary) hypertension: Secondary | ICD-10-CM | POA: Diagnosis not present

## 2014-11-02 DIAGNOSIS — Z961 Presence of intraocular lens: Secondary | ICD-10-CM | POA: Diagnosis not present

## 2014-11-02 DIAGNOSIS — E1140 Type 2 diabetes mellitus with diabetic neuropathy, unspecified: Secondary | ICD-10-CM | POA: Diagnosis not present

## 2014-11-02 DIAGNOSIS — H409 Unspecified glaucoma: Secondary | ICD-10-CM | POA: Diagnosis not present

## 2014-11-02 DIAGNOSIS — E785 Hyperlipidemia, unspecified: Secondary | ICD-10-CM | POA: Diagnosis not present

## 2014-11-02 DIAGNOSIS — I480 Paroxysmal atrial fibrillation: Secondary | ICD-10-CM | POA: Diagnosis not present

## 2014-11-02 DIAGNOSIS — Z6831 Body mass index (BMI) 31.0-31.9, adult: Secondary | ICD-10-CM | POA: Diagnosis not present

## 2014-11-23 DIAGNOSIS — E1051 Type 1 diabetes mellitus with diabetic peripheral angiopathy without gangrene: Secondary | ICD-10-CM | POA: Diagnosis not present

## 2014-11-23 DIAGNOSIS — I739 Peripheral vascular disease, unspecified: Secondary | ICD-10-CM | POA: Diagnosis not present

## 2014-11-23 DIAGNOSIS — L603 Nail dystrophy: Secondary | ICD-10-CM | POA: Diagnosis not present

## 2014-12-08 DIAGNOSIS — H401132 Primary open-angle glaucoma, bilateral, moderate stage: Secondary | ICD-10-CM | POA: Diagnosis not present

## 2014-12-14 DIAGNOSIS — C61 Malignant neoplasm of prostate: Secondary | ICD-10-CM | POA: Diagnosis not present

## 2014-12-14 DIAGNOSIS — Z7901 Long term (current) use of anticoagulants: Secondary | ICD-10-CM | POA: Diagnosis not present

## 2014-12-14 DIAGNOSIS — I480 Paroxysmal atrial fibrillation: Secondary | ICD-10-CM | POA: Diagnosis not present

## 2014-12-25 DIAGNOSIS — N5201 Erectile dysfunction due to arterial insufficiency: Secondary | ICD-10-CM | POA: Diagnosis not present

## 2014-12-25 DIAGNOSIS — R3915 Urgency of urination: Secondary | ICD-10-CM | POA: Diagnosis not present

## 2014-12-25 DIAGNOSIS — C61 Malignant neoplasm of prostate: Secondary | ICD-10-CM | POA: Diagnosis not present

## 2014-12-28 DIAGNOSIS — Z7901 Long term (current) use of anticoagulants: Secondary | ICD-10-CM | POA: Diagnosis not present

## 2014-12-28 DIAGNOSIS — I480 Paroxysmal atrial fibrillation: Secondary | ICD-10-CM | POA: Diagnosis not present

## 2014-12-31 IMAGING — CT CT CERVICAL SPINE W/O CM
4 of 6 series · 14 of 33 positions shown, 16 images · non-contrast
Comparison: None.

CLINICAL DATA: Fall.

EXAM:
CT HEAD WITHOUT CONTRAST
CT CERVICAL SPINE WITHOUT CONTRAST
TECHNIQUE: Multidetector CT imaging of the head and cervical spine was
performed following the standard protocol without intravenous
contrast. Multiplanar CT image reconstructions of the cervical spine
were also generated.

[Series 5: c-spine st · axial · 0.25mm/px · z∈[-283,-187]mm · 3 of 97 slices shown, 4 images]
[im 25/97  soft-tissue]
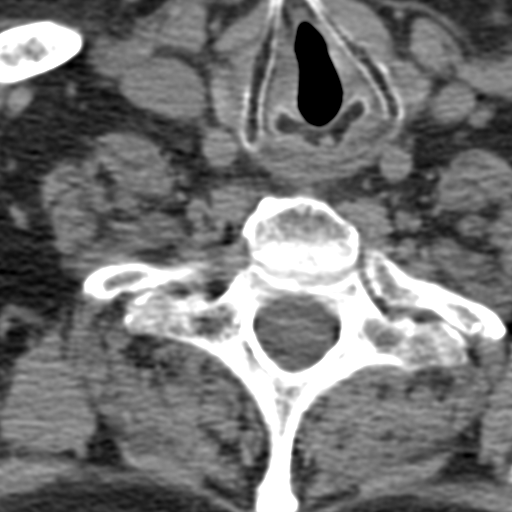
[im 25/97  bone]
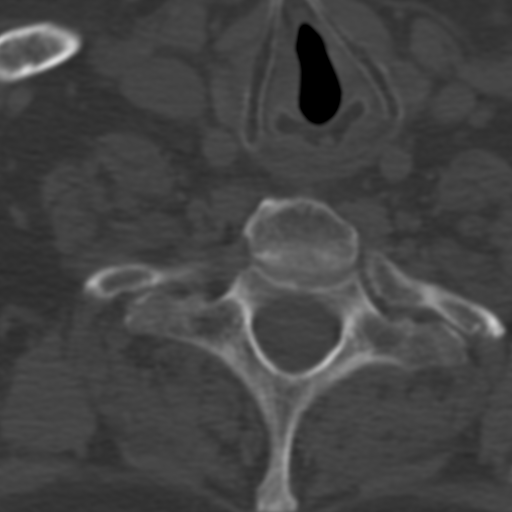
[im 49/97  bone]
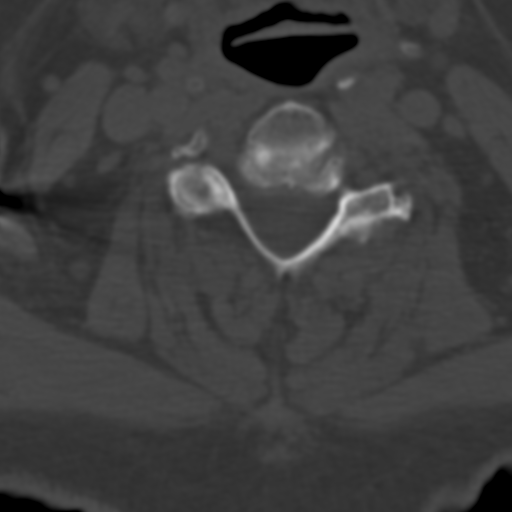
[im 73/97  bone]
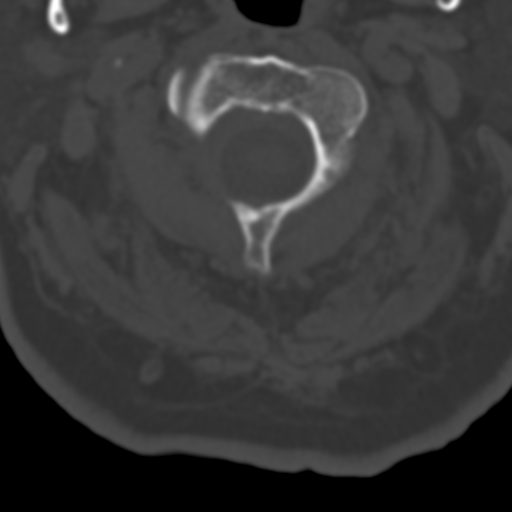

[Series 602: <mpr thick range> · coronal · 0.38mm/px · 3 of 51 slices shown]
[im 11/51  bone]
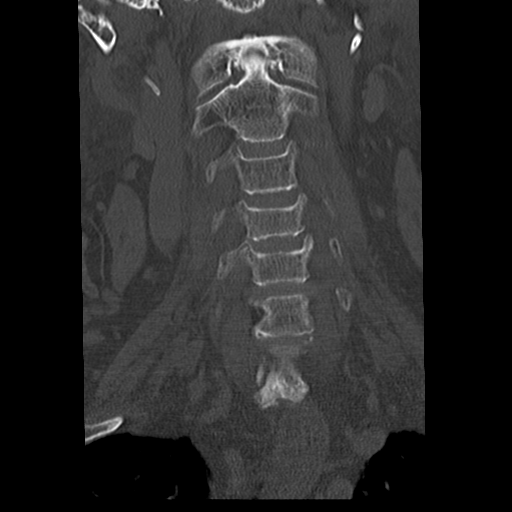
[im 21/51  bone]
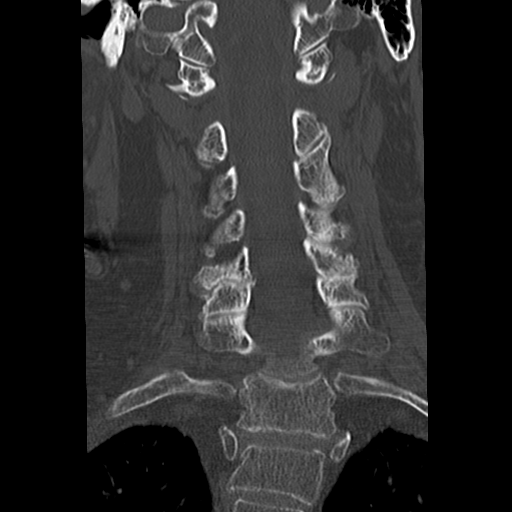
[im 31/51  bone]
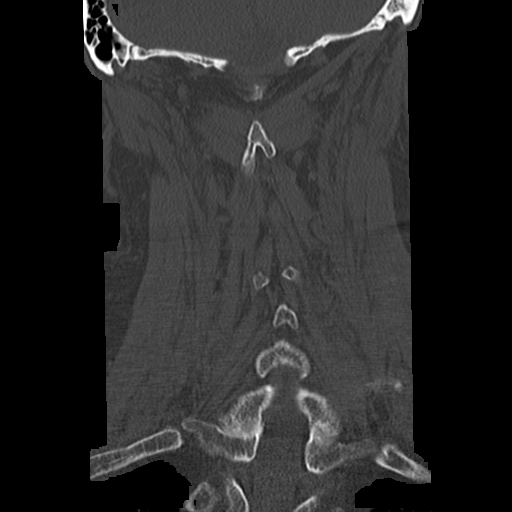

[Series 603: <mpr thick range(1)> · axial · 0.38mm/px · z∈[-322,-228]mm · 3 of 99 slices shown]
[im 25/99  bone]
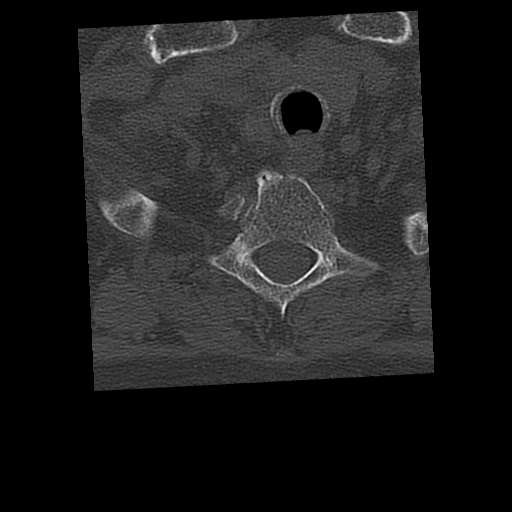
[im 50/99  bone]
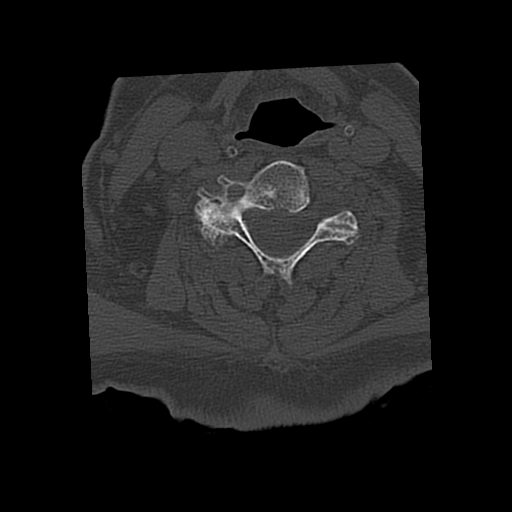
[im 74/99  bone]
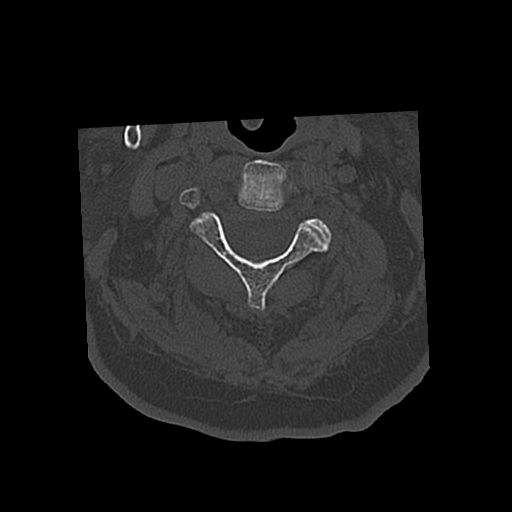

[Series 604: <mpr thick range(2)> · sagittal · 0.38mm/px · 5 of 49 slices shown, 6 images]
[im 17/49  bone]
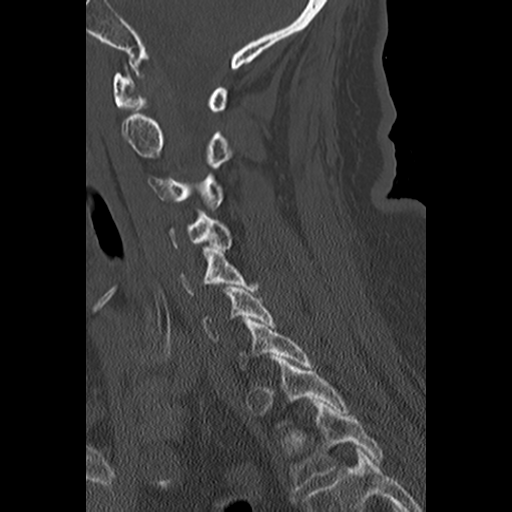
[im 21/49  bone]
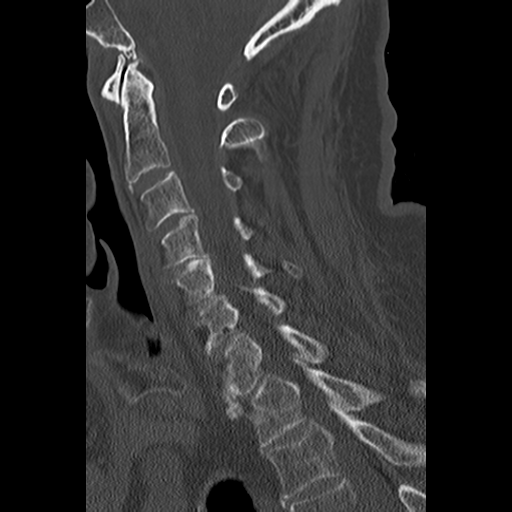
[im 25/49  soft-tissue]
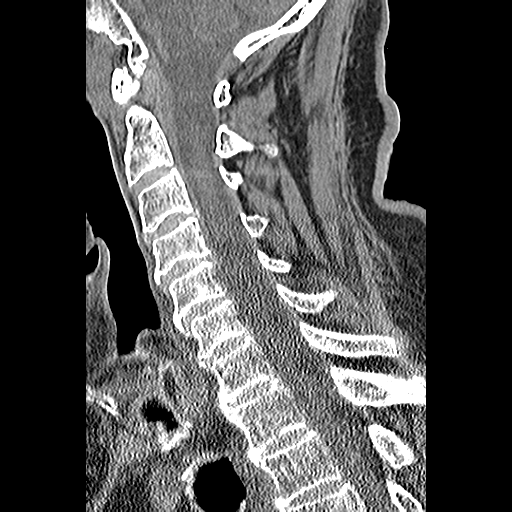
[im 25/49  bone]
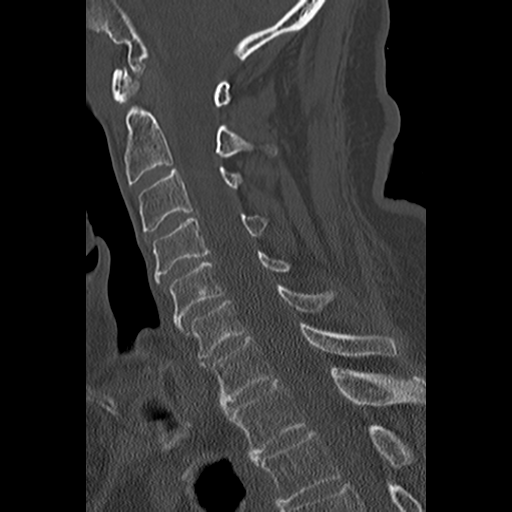
[im 29/49  bone]
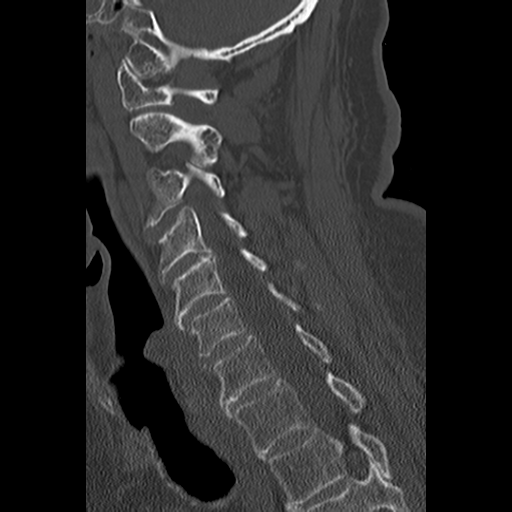
[im 33/49  bone]
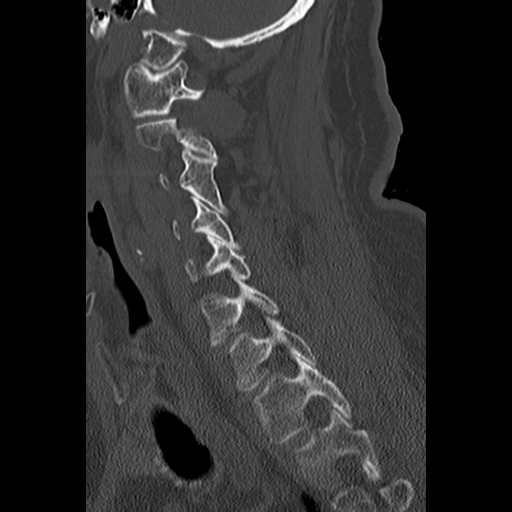

[14 of 33 positions shown; findings below may reference images not displayed]

FINDINGS: CT HEAD FINDINGS

Ventricles, cisterns and other CSF spaces are within normal. There
is mild chronic ischemic microvascular disease present. There is no
mass, mass effect, shift of midline structures or acute hemorrhage.
There is no evidence to suggest acute infarction. Remaining bones
and soft tissues are within normal.

CT CERVICAL SPINE FINDINGS

Vertebral body alignment and heights are normal. Disc space heights
are within normal. Prevertebral soft tissues as well as the
atlantoaxial articulation are within normal. There is mild
spondylosis throughout the cervical spine. There is uncovertebral
joint spurring as well as facet arthropathy. There is no acute
fracture or subluxation.
IMPRESSION: No acute intracranial findings.

No acute cervical spine injury.

Mild chronic ischemic microvascular disease.

Mild spondylosis throughout the cervical spine.

## 2015-01-31 DIAGNOSIS — Z7901 Long term (current) use of anticoagulants: Secondary | ICD-10-CM | POA: Diagnosis not present

## 2015-01-31 DIAGNOSIS — I480 Paroxysmal atrial fibrillation: Secondary | ICD-10-CM | POA: Diagnosis not present

## 2015-02-08 DIAGNOSIS — L603 Nail dystrophy: Secondary | ICD-10-CM | POA: Diagnosis not present

## 2015-02-08 DIAGNOSIS — I739 Peripheral vascular disease, unspecified: Secondary | ICD-10-CM | POA: Diagnosis not present

## 2015-02-08 DIAGNOSIS — E1051 Type 1 diabetes mellitus with diabetic peripheral angiopathy without gangrene: Secondary | ICD-10-CM | POA: Diagnosis not present

## 2015-02-20 DIAGNOSIS — E1140 Type 2 diabetes mellitus with diabetic neuropathy, unspecified: Secondary | ICD-10-CM | POA: Diagnosis not present

## 2015-02-20 DIAGNOSIS — E7800 Pure hypercholesterolemia, unspecified: Secondary | ICD-10-CM | POA: Diagnosis not present

## 2015-02-20 DIAGNOSIS — E1129 Type 2 diabetes mellitus with other diabetic kidney complication: Secondary | ICD-10-CM | POA: Diagnosis not present

## 2015-02-20 DIAGNOSIS — Z1389 Encounter for screening for other disorder: Secondary | ICD-10-CM | POA: Diagnosis not present

## 2015-02-20 DIAGNOSIS — G629 Polyneuropathy, unspecified: Secondary | ICD-10-CM | POA: Diagnosis not present

## 2015-02-20 DIAGNOSIS — I10 Essential (primary) hypertension: Secondary | ICD-10-CM | POA: Diagnosis not present

## 2015-02-20 DIAGNOSIS — R808 Other proteinuria: Secondary | ICD-10-CM | POA: Diagnosis not present

## 2015-02-20 DIAGNOSIS — H409 Unspecified glaucoma: Secondary | ICD-10-CM | POA: Diagnosis not present

## 2015-02-20 DIAGNOSIS — Z6831 Body mass index (BMI) 31.0-31.9, adult: Secondary | ICD-10-CM | POA: Diagnosis not present

## 2015-02-20 DIAGNOSIS — I480 Paroxysmal atrial fibrillation: Secondary | ICD-10-CM | POA: Diagnosis not present

## 2015-02-20 DIAGNOSIS — N182 Chronic kidney disease, stage 2 (mild): Secondary | ICD-10-CM | POA: Diagnosis not present

## 2015-02-20 DIAGNOSIS — Z7901 Long term (current) use of anticoagulants: Secondary | ICD-10-CM | POA: Diagnosis not present

## 2015-03-06 DIAGNOSIS — I480 Paroxysmal atrial fibrillation: Secondary | ICD-10-CM | POA: Diagnosis not present

## 2015-03-06 DIAGNOSIS — Z7901 Long term (current) use of anticoagulants: Secondary | ICD-10-CM | POA: Diagnosis not present

## 2015-04-05 DIAGNOSIS — Z7901 Long term (current) use of anticoagulants: Secondary | ICD-10-CM | POA: Diagnosis not present

## 2015-04-05 DIAGNOSIS — I480 Paroxysmal atrial fibrillation: Secondary | ICD-10-CM | POA: Diagnosis not present

## 2015-04-25 DIAGNOSIS — H401132 Primary open-angle glaucoma, bilateral, moderate stage: Secondary | ICD-10-CM | POA: Diagnosis not present

## 2015-04-25 DIAGNOSIS — I739 Peripheral vascular disease, unspecified: Secondary | ICD-10-CM | POA: Diagnosis not present

## 2015-04-25 DIAGNOSIS — L603 Nail dystrophy: Secondary | ICD-10-CM | POA: Diagnosis not present

## 2015-04-25 DIAGNOSIS — E1051 Type 1 diabetes mellitus with diabetic peripheral angiopathy without gangrene: Secondary | ICD-10-CM | POA: Diagnosis not present

## 2015-05-08 DIAGNOSIS — Z7901 Long term (current) use of anticoagulants: Secondary | ICD-10-CM | POA: Diagnosis not present

## 2015-05-08 DIAGNOSIS — I480 Paroxysmal atrial fibrillation: Secondary | ICD-10-CM | POA: Diagnosis not present

## 2015-06-12 DIAGNOSIS — I480 Paroxysmal atrial fibrillation: Secondary | ICD-10-CM | POA: Diagnosis not present

## 2015-06-12 DIAGNOSIS — Z7901 Long term (current) use of anticoagulants: Secondary | ICD-10-CM | POA: Diagnosis not present

## 2015-06-25 DIAGNOSIS — Z0000 Encounter for general adult medical examination without abnormal findings: Secondary | ICD-10-CM | POA: Diagnosis not present

## 2015-06-25 DIAGNOSIS — R3915 Urgency of urination: Secondary | ICD-10-CM | POA: Diagnosis not present

## 2015-06-25 DIAGNOSIS — N5201 Erectile dysfunction due to arterial insufficiency: Secondary | ICD-10-CM | POA: Diagnosis not present

## 2015-06-25 DIAGNOSIS — C61 Malignant neoplasm of prostate: Secondary | ICD-10-CM | POA: Diagnosis not present

## 2015-07-10 DIAGNOSIS — E1042 Type 1 diabetes mellitus with diabetic polyneuropathy: Secondary | ICD-10-CM | POA: Diagnosis not present

## 2015-07-18 DIAGNOSIS — E7800 Pure hypercholesterolemia, unspecified: Secondary | ICD-10-CM | POA: Diagnosis not present

## 2015-07-18 DIAGNOSIS — I10 Essential (primary) hypertension: Secondary | ICD-10-CM | POA: Diagnosis not present

## 2015-07-18 DIAGNOSIS — Z125 Encounter for screening for malignant neoplasm of prostate: Secondary | ICD-10-CM | POA: Diagnosis not present

## 2015-07-18 DIAGNOSIS — E1129 Type 2 diabetes mellitus with other diabetic kidney complication: Secondary | ICD-10-CM | POA: Diagnosis not present

## 2015-07-25 DIAGNOSIS — I129 Hypertensive chronic kidney disease with stage 1 through stage 4 chronic kidney disease, or unspecified chronic kidney disease: Secondary | ICD-10-CM | POA: Diagnosis not present

## 2015-07-25 DIAGNOSIS — N182 Chronic kidney disease, stage 2 (mild): Secondary | ICD-10-CM | POA: Diagnosis not present

## 2015-07-25 DIAGNOSIS — Z9114 Patient's other noncompliance with medication regimen: Secondary | ICD-10-CM | POA: Diagnosis not present

## 2015-07-25 DIAGNOSIS — Z0000 Encounter for general adult medical examination without abnormal findings: Secondary | ICD-10-CM | POA: Diagnosis not present

## 2015-07-25 DIAGNOSIS — Z6831 Body mass index (BMI) 31.0-31.9, adult: Secondary | ICD-10-CM | POA: Diagnosis not present

## 2015-07-25 DIAGNOSIS — R809 Proteinuria, unspecified: Secondary | ICD-10-CM | POA: Diagnosis not present

## 2015-07-25 DIAGNOSIS — G629 Polyneuropathy, unspecified: Secondary | ICD-10-CM | POA: Diagnosis not present

## 2015-07-25 DIAGNOSIS — Z7901 Long term (current) use of anticoagulants: Secondary | ICD-10-CM | POA: Diagnosis not present

## 2015-07-25 DIAGNOSIS — R1310 Dysphagia, unspecified: Secondary | ICD-10-CM | POA: Diagnosis not present

## 2015-07-25 DIAGNOSIS — Z1389 Encounter for screening for other disorder: Secondary | ICD-10-CM | POA: Diagnosis not present

## 2015-07-25 DIAGNOSIS — E1140 Type 2 diabetes mellitus with diabetic neuropathy, unspecified: Secondary | ICD-10-CM | POA: Diagnosis not present

## 2015-07-25 DIAGNOSIS — K449 Diaphragmatic hernia without obstruction or gangrene: Secondary | ICD-10-CM | POA: Diagnosis not present

## 2015-08-08 DIAGNOSIS — E1129 Type 2 diabetes mellitus with other diabetic kidney complication: Secondary | ICD-10-CM | POA: Diagnosis not present

## 2015-08-08 DIAGNOSIS — I480 Paroxysmal atrial fibrillation: Secondary | ICD-10-CM | POA: Diagnosis not present

## 2015-08-08 DIAGNOSIS — Z7901 Long term (current) use of anticoagulants: Secondary | ICD-10-CM | POA: Diagnosis not present

## 2015-08-08 DIAGNOSIS — N182 Chronic kidney disease, stage 2 (mild): Secondary | ICD-10-CM | POA: Diagnosis not present

## 2015-09-12 DIAGNOSIS — I480 Paroxysmal atrial fibrillation: Secondary | ICD-10-CM | POA: Diagnosis not present

## 2015-09-12 DIAGNOSIS — Z7901 Long term (current) use of anticoagulants: Secondary | ICD-10-CM | POA: Diagnosis not present

## 2015-09-26 DIAGNOSIS — Z7901 Long term (current) use of anticoagulants: Secondary | ICD-10-CM | POA: Diagnosis not present

## 2015-09-26 DIAGNOSIS — I480 Paroxysmal atrial fibrillation: Secondary | ICD-10-CM | POA: Diagnosis not present

## 2015-09-26 DIAGNOSIS — E1129 Type 2 diabetes mellitus with other diabetic kidney complication: Secondary | ICD-10-CM | POA: Diagnosis not present

## 2015-11-02 DIAGNOSIS — Z23 Encounter for immunization: Secondary | ICD-10-CM | POA: Diagnosis not present

## 2015-11-02 DIAGNOSIS — N182 Chronic kidney disease, stage 2 (mild): Secondary | ICD-10-CM | POA: Diagnosis not present

## 2015-11-02 DIAGNOSIS — E1129 Type 2 diabetes mellitus with other diabetic kidney complication: Secondary | ICD-10-CM | POA: Diagnosis not present

## 2015-11-02 DIAGNOSIS — Z7901 Long term (current) use of anticoagulants: Secondary | ICD-10-CM | POA: Diagnosis not present

## 2015-11-02 DIAGNOSIS — E668 Other obesity: Secondary | ICD-10-CM | POA: Diagnosis not present

## 2015-11-02 DIAGNOSIS — Z6830 Body mass index (BMI) 30.0-30.9, adult: Secondary | ICD-10-CM | POA: Diagnosis not present

## 2015-11-02 DIAGNOSIS — C61 Malignant neoplasm of prostate: Secondary | ICD-10-CM | POA: Diagnosis not present

## 2015-11-02 DIAGNOSIS — E1140 Type 2 diabetes mellitus with diabetic neuropathy, unspecified: Secondary | ICD-10-CM | POA: Diagnosis not present

## 2015-11-02 DIAGNOSIS — H409 Unspecified glaucoma: Secondary | ICD-10-CM | POA: Diagnosis not present

## 2015-11-02 DIAGNOSIS — E7800 Pure hypercholesterolemia, unspecified: Secondary | ICD-10-CM | POA: Diagnosis not present

## 2015-11-02 DIAGNOSIS — I480 Paroxysmal atrial fibrillation: Secondary | ICD-10-CM | POA: Diagnosis not present

## 2015-11-02 DIAGNOSIS — I129 Hypertensive chronic kidney disease with stage 1 through stage 4 chronic kidney disease, or unspecified chronic kidney disease: Secondary | ICD-10-CM | POA: Diagnosis not present

## 2015-11-16 DIAGNOSIS — H11153 Pinguecula, bilateral: Secondary | ICD-10-CM | POA: Diagnosis not present

## 2015-11-16 DIAGNOSIS — H401132 Primary open-angle glaucoma, bilateral, moderate stage: Secondary | ICD-10-CM | POA: Diagnosis not present

## 2015-11-16 DIAGNOSIS — E119 Type 2 diabetes mellitus without complications: Secondary | ICD-10-CM | POA: Diagnosis not present

## 2015-11-16 DIAGNOSIS — H18413 Arcus senilis, bilateral: Secondary | ICD-10-CM | POA: Diagnosis not present

## 2015-11-16 DIAGNOSIS — Z961 Presence of intraocular lens: Secondary | ICD-10-CM | POA: Diagnosis not present

## 2015-11-18 HISTORY — PX: TRANSTHORACIC ECHOCARDIOGRAM: SHX275

## 2015-11-18 HISTORY — PX: OTHER SURGICAL HISTORY: SHX169

## 2015-11-20 DIAGNOSIS — E1051 Type 1 diabetes mellitus with diabetic peripheral angiopathy without gangrene: Secondary | ICD-10-CM | POA: Diagnosis not present

## 2015-11-20 DIAGNOSIS — L603 Nail dystrophy: Secondary | ICD-10-CM | POA: Diagnosis not present

## 2015-11-20 DIAGNOSIS — I739 Peripheral vascular disease, unspecified: Secondary | ICD-10-CM | POA: Diagnosis not present

## 2015-11-25 ENCOUNTER — Inpatient Hospital Stay (HOSPITAL_COMMUNITY)
Admission: EM | Admit: 2015-11-25 | Discharge: 2015-11-27 | DRG: 066 | Disposition: A | Discharge status: Home / Self Care | Payer: Medicare Other | Attending: Internal Medicine | Admitting: Internal Medicine

## 2015-11-25 ENCOUNTER — Observation Stay (HOSPITAL_COMMUNITY): Payer: Medicare Other

## 2015-11-25 ENCOUNTER — Emergency Department (HOSPITAL_COMMUNITY): Payer: Medicare Other

## 2015-11-25 ENCOUNTER — Encounter (HOSPITAL_COMMUNITY): Payer: Self-pay | Admitting: Emergency Medicine

## 2015-11-25 DIAGNOSIS — E1165 Type 2 diabetes mellitus with hyperglycemia: Secondary | ICD-10-CM | POA: Diagnosis present

## 2015-11-25 DIAGNOSIS — R471 Dysarthria and anarthria: Secondary | ICD-10-CM | POA: Diagnosis present

## 2015-11-25 DIAGNOSIS — R29810 Facial weakness: Secondary | ICD-10-CM | POA: Diagnosis present

## 2015-11-25 DIAGNOSIS — I639 Cerebral infarction, unspecified: Secondary | ICD-10-CM | POA: Diagnosis not present

## 2015-11-25 DIAGNOSIS — R29818 Other symptoms and signs involving the nervous system: Secondary | ICD-10-CM | POA: Diagnosis not present

## 2015-11-25 DIAGNOSIS — E785 Hyperlipidemia, unspecified: Secondary | ICD-10-CM | POA: Diagnosis present

## 2015-11-25 DIAGNOSIS — R262 Difficulty in walking, not elsewhere classified: Secondary | ICD-10-CM

## 2015-11-25 DIAGNOSIS — Z8546 Personal history of malignant neoplasm of prostate: Secondary | ICD-10-CM

## 2015-11-25 DIAGNOSIS — Z8673 Personal history of transient ischemic attack (TIA), and cerebral infarction without residual deficits: Secondary | ICD-10-CM | POA: Diagnosis present

## 2015-11-25 DIAGNOSIS — I4891 Unspecified atrial fibrillation: Secondary | ICD-10-CM | POA: Diagnosis not present

## 2015-11-25 DIAGNOSIS — Z923 Personal history of irradiation: Secondary | ICD-10-CM

## 2015-11-25 DIAGNOSIS — H409 Unspecified glaucoma: Secondary | ICD-10-CM | POA: Diagnosis present

## 2015-11-25 DIAGNOSIS — G459 Transient cerebral ischemic attack, unspecified: Secondary | ICD-10-CM | POA: Insufficient documentation

## 2015-11-25 DIAGNOSIS — I6339 Cerebral infarction due to thrombosis of other cerebral artery: Secondary | ICD-10-CM | POA: Diagnosis not present

## 2015-11-25 DIAGNOSIS — Z808 Family history of malignant neoplasm of other organs or systems: Secondary | ICD-10-CM

## 2015-11-25 DIAGNOSIS — H269 Unspecified cataract: Secondary | ICD-10-CM | POA: Diagnosis present

## 2015-11-25 DIAGNOSIS — Z7901 Long term (current) use of anticoagulants: Secondary | ICD-10-CM

## 2015-11-25 DIAGNOSIS — I482 Chronic atrial fibrillation: Secondary | ICD-10-CM | POA: Diagnosis not present

## 2015-11-25 DIAGNOSIS — R4701 Aphasia: Secondary | ICD-10-CM | POA: Diagnosis present

## 2015-11-25 DIAGNOSIS — Z794 Long term (current) use of insulin: Secondary | ICD-10-CM

## 2015-11-25 DIAGNOSIS — I63412 Cerebral infarction due to embolism of left middle cerebral artery: Secondary | ICD-10-CM | POA: Diagnosis not present

## 2015-11-25 DIAGNOSIS — Z809 Family history of malignant neoplasm, unspecified: Secondary | ICD-10-CM

## 2015-11-25 DIAGNOSIS — Z7984 Long term (current) use of oral hypoglycemic drugs: Secondary | ICD-10-CM

## 2015-11-25 DIAGNOSIS — I10 Essential (primary) hypertension: Secondary | ICD-10-CM | POA: Diagnosis not present

## 2015-11-25 DIAGNOSIS — G629 Polyneuropathy, unspecified: Secondary | ICD-10-CM | POA: Diagnosis not present

## 2015-11-25 DIAGNOSIS — C61 Malignant neoplasm of prostate: Secondary | ICD-10-CM | POA: Diagnosis present

## 2015-11-25 LAB — CBC
HEMATOCRIT: 44.1 % (ref 39.0–52.0)
Hemoglobin: 15.5 g/dL (ref 13.0–17.0)
MCH: 31.1 pg (ref 26.0–34.0)
MCHC: 35.1 g/dL (ref 30.0–36.0)
MCV: 88.4 fL (ref 78.0–100.0)
Platelets: 224 10*3/uL (ref 150–400)
RBC: 4.99 MIL/uL (ref 4.22–5.81)
RDW: 12.7 % (ref 11.5–15.5)
WBC: 8.4 10*3/uL (ref 4.0–10.5)

## 2015-11-25 LAB — COMPREHENSIVE METABOLIC PANEL
ALT: 17 U/L (ref 17–63)
AST: 19 U/L (ref 15–41)
Albumin: 3.4 g/dL — ABNORMAL LOW (ref 3.5–5.0)
Alkaline Phosphatase: 103 U/L (ref 38–126)
Anion gap: 10 (ref 5–15)
BUN: 16 mg/dL (ref 6–20)
CHLORIDE: 103 mmol/L (ref 101–111)
CO2: 21 mmol/L — ABNORMAL LOW (ref 22–32)
CREATININE: 1.23 mg/dL (ref 0.61–1.24)
Calcium: 8.7 mg/dL — ABNORMAL LOW (ref 8.9–10.3)
GFR calc Af Amer: >60 mL/min (ref >60)
GFR, EST NON AFRICAN AMERICAN: 53 mL/min — AB (ref >60)
Glucose, Bld: 249 mg/dL — ABNORMAL HIGH (ref 65–99)
Potassium: 4.1 mmol/L (ref 3.5–5.1)
Sodium: 134 mmol/L — ABNORMAL LOW (ref 135–145)
Total Bilirubin: 0.7 mg/dL (ref 0.3–1.2)
Total Protein: 6.4 g/dL — ABNORMAL LOW (ref 6.5–8.1)

## 2015-11-25 LAB — DIFFERENTIAL
BASOS PCT: 1 %
Basophils Absolute: 0.1 10*3/uL (ref 0.0–0.1)
Eosinophils Absolute: 0.2 10*3/uL (ref 0.0–0.7)
Eosinophils Relative: 2 %
LYMPHS PCT: 24 %
Lymphs Abs: 2 10*3/uL (ref 0.7–4.0)
MONOS PCT: 6 %
Monocytes Absolute: 0.5 10*3/uL (ref 0.1–1.0)
NEUTROS ABS: 5.7 10*3/uL (ref 1.7–7.7)
Neutrophils Relative %: 67 %

## 2015-11-25 LAB — URINE MICROSCOPIC-ADD ON

## 2015-11-25 LAB — I-STAT CHEM 8, ED
BUN: 18 mg/dL (ref 6–20)
CREATININE: 1.2 mg/dL (ref 0.61–1.24)
Calcium, Ion: 1.07 mmol/L — ABNORMAL LOW (ref 1.15–1.40)
Chloride: 103 mmol/L (ref 101–111)
Glucose, Bld: 244 mg/dL — ABNORMAL HIGH (ref 65–99)
HEMATOCRIT: 46 % (ref 39.0–52.0)
Hemoglobin: 15.6 g/dL (ref 13.0–17.0)
POTASSIUM: 4.1 mmol/L (ref 3.5–5.1)
Sodium: 137 mmol/L (ref 135–145)
TCO2: 22 mmol/L (ref 0–100)

## 2015-11-25 LAB — URINALYSIS, ROUTINE W REFLEX MICROSCOPIC
Bilirubin Urine: NEGATIVE
Ketones, ur: NEGATIVE mg/dL
LEUKOCYTES UA: NEGATIVE
Nitrite: NEGATIVE
PH: 5.5 (ref 5.0–8.0)
Protein, ur: NEGATIVE mg/dL
SPECIFIC GRAVITY, URINE: 1.015 (ref 1.005–1.030)

## 2015-11-25 LAB — I-STAT TROPONIN, ED: TROPONIN I, POC: 0.25 ng/mL — AB (ref 0.00–0.08)

## 2015-11-25 LAB — PROTIME-INR
INR: 1.17
Prothrombin Time: 15 seconds (ref 11.4–15.2)

## 2015-11-25 LAB — GLUCOSE, CAPILLARY: GLUCOSE-CAPILLARY: 88 mg/dL (ref 65–99)

## 2015-11-25 LAB — APTT: APTT: 26 s (ref 24–36)

## 2015-11-25 MED ORDER — LATANOPROST 0.005 % OP SOLN
1.0000 [drp] | Freq: Every day | OPHTHALMIC | Status: DC
  Administered 2015-11-25 – 2015-11-26 (×2): 1 [drp] via OPHTHALMIC
  Filled 2015-11-25: qty 2.5

## 2015-11-25 MED ORDER — ASPIRIN 300 MG RE SUPP: 300.0000 mg | Freq: Every day | RECTAL | Status: DC

## 2015-11-25 MED ORDER — STROKE: EARLY STAGES OF RECOVERY BOOK
Freq: Once | Status: AC
  Administered 2015-11-25: 23:00:00
  Filled 2015-11-25: qty 1

## 2015-11-25 MED ORDER — WARFARIN - PHARMACIST DOSING INPATIENT: Freq: Every day | Status: DC

## 2015-11-25 MED ORDER — INSULIN ASPART 100 UNIT/ML ~~LOC~~ SOLN
0.0000 [IU] | Freq: Three times a day (TID) | SUBCUTANEOUS | Status: DC
  Administered 2015-11-26: 2 [IU] via SUBCUTANEOUS
  Administered 2015-11-26 (×2): 1 [IU] via SUBCUTANEOUS
  Administered 2015-11-27: 3 [IU] via SUBCUTANEOUS

## 2015-11-25 MED ORDER — ASPIRIN 325 MG PO TABS
ORAL_TABLET | ORAL | Status: AC
  Administered 2015-11-25: 325 mg
  Filled 2015-11-25: qty 1

## 2015-11-25 MED ORDER — WARFARIN SODIUM 7.5 MG PO TABS
7.5000 mg | ORAL_TABLET | ORAL | Status: AC
  Administered 2015-11-25: 7.5 mg via ORAL
  Filled 2015-11-25: qty 1

## 2015-11-25 MED ORDER — TAMSULOSIN HCL 0.4 MG PO CAPS
0.4000 mg | ORAL_CAPSULE | Freq: Every day | ORAL | Status: DC
  Administered 2015-11-25 – 2015-11-27 (×3): 0.4 mg via ORAL
  Filled 2015-11-25 (×2): qty 1

## 2015-11-25 MED ORDER — ASPIRIN EC 325 MG PO TBEC
325.0000 mg | DELAYED_RELEASE_TABLET | Freq: Every day | ORAL | Status: DC
  Administered 2015-11-26 – 2015-11-27 (×2): 325 mg via ORAL
  Filled 2015-11-25 (×2): qty 1

## 2015-11-25 MED ORDER — INSULIN GLARGINE 100 UNIT/ML ~~LOC~~ SOLN
45.0000 [IU] | Freq: Every day | SUBCUTANEOUS | Status: DC
  Administered 2015-11-26: 45 [IU] via SUBCUTANEOUS
  Filled 2015-11-25 (×2): qty 0.45

## 2015-11-25 MED ORDER — PRAVASTATIN SODIUM 20 MG PO TABS
10.0000 mg | ORAL_TABLET | Freq: Every day | ORAL | Status: DC
  Administered 2015-11-25: 10 mg via ORAL
  Filled 2015-11-25 (×2): qty 1

## 2015-11-25 MED ORDER — SODIUM CHLORIDE 0.9 % IV SOLN
INTRAVENOUS | Status: AC
  Administered 2015-11-25: 23:00:00 via INTRAVENOUS

## 2015-11-25 MED ORDER — METOPROLOL TARTRATE 12.5 MG HALF TABLET
12.5000 mg | ORAL_TABLET | Freq: Two times a day (BID) | ORAL | Status: DC
  Administered 2015-11-25 – 2015-11-27 (×4): 12.5 mg via ORAL
  Filled 2015-11-25 (×4): qty 1

## 2015-11-25 NOTE — Progress Notes (Signed)
ANTICOAGULATION CONSULT NOTE - Initial Consult  Pharmacy Consult for Coumadin Indication: atrial fibrillation  Allergies  Allergen Reactions  . Aureomycin [Chlortetracycline] Itching and Rash    Reaction approximately 1956    Patient Measurements: Height: 6\' 1"  (185.4 cm) Weight: 236 lb (107 kg) IBW/kg (Calculated) : 79.9  Vital Signs: BP: 134/86 (10/08 2000) Pulse Rate: 80 (10/08 2000)  Labs:  Recent Labs  11/25/15 1948 11/25/15 1957  HGB 15.5 15.6  HCT 44.1 46.0  PLT 224  --   APTT 26  --   LABPROT 15.0  --   INR 1.17  --   CREATININE 1.23 1.20    Estimated Creatinine Clearance: 61.9 mL/min (by C-G formula based on SCr of 1.2 mg/dL).   Medical History: Past Medical History:  Diagnosis Date  . Atrial fibrillation ([place])   . Cancer [facility])    prostate cancer  . Cataract    no surgery yet  . Diabetes mellitus without complication ([place])    type II  . Glaucoma    left eye  . Hx of radiation therapy 08/15/13- 10/06/13   prostate 7600 cGy 38 sessions  . Neuropathy (H[facility])    diabetic, in feet  . Personal history of fall 09/26/13   front entrance of [facility]    Medications:  See electronic med rec  Assessment: 82 y.o. M presents with slurred speech and R facial droop - probable L MCA infarction. Pt on coumadin PTA for afib. Admission INR 1.17 (subtherapeutic) -?taking coumadin as prescribed. CBC ok on admission. Home dose 5mg  daily - last taken per pt on 10/7  Goal of Therapy:  INR 2-3 Monitor platelets by anticoagulation protocol: Yes   Plan:  Coumadin 7.5mg  po now Daily INR  Sherlon Handing, PharmD, BCPS Clinical pharmacist, pager [phone] 11/25/2015,9:57 PM

## 2015-11-25 NOTE — ED Notes (Signed)
Pt going back to xray

## 2015-11-25 NOTE — H&P (Addendum)
History and Physical    Charles Friedman [id number] DOB: May 16, 1932 DOA: 11/25/2015  PCP: Haywood Pao, MD  Patient coming from: Home  Chief Complaint: Slurred speech, facial droop.  HPI: Charles Friedman is a 82 y.o. male with hx of A-fib which he is on coumadin, and DM type 2 presents to the ED with complaints of slurred speech onset early this evening. Per patients family, they noticed his slurred around 4 pm this evening when he was on the phone with them. They immediately came to his house where they noticed he had left sided facial drooping in addition to the slurred speech. They called the EMS immediately and he was transferred to the ED. CT of the head did not show anything acute. On-call neurologist Dr. Wallie Char was consulted. Patient was not felt to be a candidate for TPA due to minimal deficits. In addition to his blood sugar is found to be elevated more than 100 but not in DKA. He missed his dose of lantus 40 units for his diabetes last night, but he did take a dose of lantus this afternoon. He denies any extremity weakness, vision changes, difficulty swallowing, and headaches. He also denies nausea, vomiting, and CP.   ED Course: While in the ED, glucose 244, calcium ionized 1.07, troponin 0.25. CBC unremarkable. CT head unremarkable. Neurology was consulted. UA was negative. Urine cultures are pending. He will be admitted for treatment of stroke and hyperglycemia.  Review of Systems: As per HPI, rest all negative.   Past Medical History:  Diagnosis Date  . Atrial fibrillation ([place])   . Cancer [facility])    prostate cancer  . Cataract    no surgery yet  . Diabetes mellitus without complication ([place])    type II  . Glaucoma    left eye  . Hx of radiation therapy 08/15/13- 10/06/13   prostate 7600 cGy 38 sessions  . Neuropathy (H[facility])    diabetic, in feet  . Personal history of fall 09/26/13   front entrance of [place]    Past Surgical History:  Procedure Laterality Date  .  PROSTATE BIOPSY  12/25/11   Adenocarcinoma, gleason 6     reports that he has never smoked. He has never used smokeless tobacco. He reports that he does not drink alcohol or use drugs.  Allergies  Allergen Reactions  . Aureomycin [Chlortetracycline] Itching and Rash    Reaction approximately 1956    Family History  Problem Relation Age of Onset  . Cancer Sister 45    brain tumor died at age 8    Prior to Admission medications   Medication Sig Start Date End Date Taking? Authorizing Provider  glyBURIDE-metformin (GLUCOVANCE) 2.5-500 MG per tablet Take 1 tablet by mouth 2 (two) times daily with a meal.    Yes Historical Provider, MD  insulin glargine (LANTUS) 100 UNIT/ML injection Inject 45 Units into the skin at bedtime.    Yes Historical Provider, MD  latanoprost (XALATAN) 0.005 % ophthalmic solution Place 1 drop into both eyes at bedtime.    Yes Historical Provider, MD  metoprolol tartrate (LOPRESSOR) 25 MG tablet Take 0.5 tablets (12.5 mg total) by mouth 2 (two) times daily. 07/18/14  Yes Geradine Girt, DO  pravastatin (PRAVACHOL) 10 MG tablet Take 10 mg by mouth at bedtime.    Yes Historical Provider, MD  tamsulosin (FLOMAX) 0.4 MG CAPS capsule Take 0.4 mg by mouth daily. 10/21/15  Yes Historical Provider, MD  warfarin (COUMADIN) 5 MG  tablet Take 5 mg by mouth daily at 6 PM. Hx A-Fib, per coumadin pharmacy protocol   Yes Historical Provider, MD    Physical Exam: Vitals:   11/25/15 1831 11/25/15 1900 11/25/15 1930 11/25/15 2000  BP:  (!) 139/105 154/98 134/86  Pulse:  97 97 80  Resp:  15 22 20   SpO2:  95% 98% 97%  Weight: 107 kg (236 lb)     Height: 6\' 1"  (1.854 m)         Constitutional: Appears calm and comfortable Vitals:   11/25/15 1831 11/25/15 1900 11/25/15 1930 11/25/15 2000  BP:  (!) 139/105 154/98 134/86  Pulse:  97 97 80  Resp:  15 22 20   SpO2:  95% 98% 97%  Weight: 107 kg (236 lb)     Height: 6\' 1"  (1.854 m)      Eyes: Anicteric no pallor ENMT: No  discharge from the ears, eyes, nose, and mouth. Neck: No JVD appreciated. No neck rigidity. Respiratory: No rhonchi. No crepitations. Cardiovascular: No murmurs heard.S1-S2 heard. Abdomen: Abdomen appears soft nontender, nondistended. Bowel sounds heard. Musculoskeletal: No edema no joint effusion. Skin: No rashes or lesions. Skin appears warm. Neurologic: Alert, awake, and oriented x3. He follows commands. Has left-sided facial drooping. Still has slurred speech. Perla positive. Moves all extremities 5 x 5. Psychiatric: Appears normal. Affect normal.   Labs on Admission: I have personally reviewed following labs and imaging studies  CBC:  Recent Labs Lab 11/25/15 1948 11/25/15 1957  WBC 8.4  --   NEUTROABS 5.7  --   HGB 15.5 15.6  HCT 44.1 46.0  MCV 88.4  --   PLT 224  --    Basic Metabolic Panel:  Recent Labs Lab 11/25/15 1948 11/25/15 1957  NA 134* 137  K 4.1 4.1  CL 103 103  CO2 21*  --   GLUCOSE 249* 244*  BUN 16 18  CREATININE 1.23 1.20  CALCIUM 8.7*  --    GFR: Estimated Creatinine Clearance: 61.9 mL/min (by C-G formula based on SCr of 1.2 mg/dL). Liver Function Tests:  Recent Labs Lab 11/25/15 1948  AST 19  ALT 17  ALKPHOS 103  BILITOT 0.7  PROT 6.4*  ALBUMIN 3.4*   No results for input(s): LIPASE, AMYLASE in the last 168 hours. No results for input(s): AMMONIA in the last 168 hours. Coagulation Profile:  Recent Labs Lab 11/25/15 1948  INR 1.17   Cardiac Enzymes: No results for input(s): CKTOTAL, CKMB, CKMBINDEX, TROPONINI in the last 168 hours. BNP (last 3 results) No results for input(s): PROBNP in the last 8760 hours. HbA1C: No results for input(s): HGBA1C in the last 72 hours. CBG: No results for input(s): GLUCAP in the last 168 hours. Lipid Profile: No results for input(s): CHOL, HDL, LDLCALC, TRIG, CHOLHDL, LDLDIRECT in the last 72 hours. Thyroid Function Tests: No results for input(s): TSH, T4TOTAL, FREET4, T3FREE, THYROIDAB in  the last 72 hours. Anemia Panel: No results for input(s): VITAMINB12, FOLATE, FERRITIN, TIBC, IRON, RETICCTPCT in the last 72 hours. Urine analysis:    Component Value Date/Time   COLORURINE YELLOW 11/25/2015 1937   APPEARANCEUR CLEAR 11/25/2015 1937   LABSPEC 1.015 11/25/2015 1937   PHURINE 5.5 11/25/2015 1937   GLUCOSEU >1000 (A) 11/25/2015 1937   HGBUR TRACE (A) 11/25/2015 1937   BILIRUBINUR NEGATIVE 11/25/2015 1937   KETONESUR NEGATIVE 11/25/2015 1937   PROTEINUR NEGATIVE 11/25/2015 1937   UROBILINOGEN 0.2 07/15/2014 2038   NITRITE NEGATIVE 11/25/2015 1937   LEUKOCYTESUR NEGATIVE  11/25/2015 1937   Sepsis Labs: @LABRCNTIP (procalcitonin:4,lacticidven:4) )No results found for this or any previous visit (from the past 240 hour(s)).   Radiological Exams on Admission: Ct Head Wo Contrast  Result Date: 11/25/2015 CLINICAL DATA:  Acute onset of slurred speech and right-sided facial droop. Initial encounter. EXAM: CT HEAD WITHOUT CONTRAST TECHNIQUE: Contiguous axial images were obtained from the base of the skull through the vertex without intravenous contrast. COMPARISON:  CT of the head performed 09/26/2013 FINDINGS: Brain: No evidence of acute infarction, hemorrhage, hydrocephalus, extra-axial collection or mass lesion/mass effect. Prominence of ventricles and sulci reflects mild to moderate cortical volume loss. Scattered periventricular and subcortical white matter change likely reflects small vessel ischemic microangiopathy. The brainstem and fourth ventricle are within normal limits. The basal ganglia are unremarkable in appearance. The cerebral hemispheres demonstrate grossly normal gray-white differentiation. No mass effect or midline shift is seen. Vascular: No hyperdense vessel or unexpected calcification. Skull: There is no evidence of fracture; visualized osseous structures are unremarkable in appearance. Sinuses/Orbits: The orbits are within normal limits. The paranasal sinuses and  mastoid air cells are well-aerated. Other: No significant soft tissue abnormalities are seen. IMPRESSION: 1. No acute intracranial pathology seen on CT. 2. Mild to moderate cortical volume loss and scattered small vessel ischemic microangiopathy. Electronically Signed   By: Garald Balding M.D.   On: 11/25/2015 21:05    EKG: Independently reviewed. Atrial Fibrillation   Assessment/Plan Principal Problem:   Cerebral infarction [facility]) Active Problems:   Prostate cancer ([place])   Type 2 diabetes mellitus with hyperglycemia (HCC)   Atrial fibrillation ([place])    1. Stroke - Patient still has slurred speech with facial drooping. CT head was unremarkable. Will get an MRI/MRA of brain, 2-D echo and carotid Dopplers. Appreciate neurology consult. Patient on Coumadin but subtherapeutic and Coumadin will be dosed per pharmacy. Patient states he was compliant with Coumadin. Check hemoglobin A1c and lipid panel physical therapy consult. 2. DM type 2 with hyperglycemia - Glucose 244. He is not taking his prescription of metformin and glyburide. Pt admits to taking lantus before bed every night. He did miss a dose of lantus 40 units last night. He took a dose of lantus this afternoon. He recently had an Hgb a1c of 7.3 a few weeks ago. Check CBG closely with sliding scale. Monitor closely 3. Chronic A-fib - EKG shows A-fib with no significant changes. Pt is currently taking coumadin and metoprolol. Chads 2 vasc score of 6. Coumadin is subtherapeutic. Coumadin will be dosed per pharmacy. 4. HTN. Stable - Continue on current meds are more metoprolol. Allow for permissive hypertension. Monitor closely. 5. Hx of prostate CA - Noted  Elevated point-of-care troponin. Will cycle cardiac markers patient denies any chest pain with EKG showing nothing acute.   DVT prophylaxis: Coumadin Code Status: Full Family Communication: Family at bedside Disposition Plan: Discharge home once improved  Consults called: Neurology    Admission status: admit to observation   Gean Birchwood, MD [facility] Pager [phone]. If 7PM-7AM, please contact night-coverage [email or web address] Password [email or web address]  11/25/2015, 9:22 PM   By signing my name below, I, Hilbert Odor, attest that this documentation has been prepared under the direction and in the presence of Gean Birchwood, MD. Electronically signed: Hilbert Odor, Scribe.  11/25/15, 9:30 PM

## 2015-11-25 NOTE — ED Notes (Signed)
The pt reports that he is comfortable at present

## 2015-11-25 NOTE — ED Notes (Signed)
Dr Nicole Kindred and hospitalist has seen this pt

## 2015-11-25 NOTE — ED Notes (Signed)
The pt passed his swallow screen ?

## 2015-11-25 NOTE — ED Triage Notes (Signed)
Received pt from home with c/o pt reports that he woke up at noon normal. About 1:30-2:00, pt reports that he had slurred speech. Right sided facial droop noted by EMS.

## 2015-11-25 NOTE — ED Provider Notes (Signed)
[facility] DEPT Provider Note  CSN: [id number] Arrival Date & Time: 11/25/15 @ [id number]  History    Chief Complaint Chief Complaint  Patient presents with  . Aphasia  . Facial Droop    HPI Charles Friedman is a 82 y.o. male.  Patient presents for symptoms that started at 2 PM when the symptoms started which includes slurred speech and left side of the lower face. Denies focal weakness in extremities or abnormal sensation. No vision changes. No CP or SOB.  No previous CVA or TIA. Has history AFib on coumadin and DM on metformin, which he is not taking and lantus 40 mg which he takes daily.  Patient is unsure of all medications but endorses he stopped the metformin.  Past Medical & Surgical History    Past Medical History:  Diagnosis Date  . Atrial fibrillation ([place])   . Cancer [facility])    prostate cancer  . Cataract    no surgery yet  . Diabetes mellitus without complication ([place])    type II  . Glaucoma    left eye  . Hx of radiation therapy 08/15/13- 10/06/13   prostate 7600 cGy 38 sessions  . Neuropathy (H[facility])    diabetic, in feet  . Personal history of fall 09/26/13   front entrance of [place]   Patient Active Problem List   Diagnosis Date Noted  . Acute CVA (cerebrovascular accident) ([place]) 11/26/2015  . TIA (transient ischemic attack) 11/25/2015  . Cerebral infarction ([place]) 11/25/2015  . Stroke (cerebrum) ([place]) 11/25/2015  . Type 2 diabetes mellitus with hyperglycemia ([place]) 07/15/2014  . UTI (lower urinary tract infection) 07/15/2014  . Fall at home 07/15/2014  . Closed rib fracture 07/15/2014  . Dehydration, mild 07/15/2014  . Diabetes mellitus without complication ([place])   . Atrial fibrillation ([place])   . Hx of radiation therapy   . Chronic atrial fibrillation ([place])   . Prostate cancer ([place]) 01/29/2012   Past Surgical History:  Procedure Laterality Date  . PROSTATE BIOPSY  12/25/11   Adenocarcinoma, gleason 6    Family & Social History    Family History  Problem  Relation Age of Onset  . Cancer Sister 22    brain tumor died at age 5   Social History  Substance Use Topics  . Smoking status: Never Smoker  . Smokeless tobacco: Never Used  . Alcohol use No    Home Medications    Prior to Admission medications   Medication Sig Start Date End Date Taking? Authorizing Provider  glyBURIDE-metformin (GLUCOVANCE) 2.5-500 MG per tablet Take 1 tablet by mouth 2 (two) times daily with a meal.    Yes Historical Provider, MD  insulin glargine (LANTUS) 100 UNIT/ML injection Inject 45 Units into the skin at bedtime.    Yes Historical Provider, MD  latanoprost (XALATAN) 0.005 % ophthalmic solution Place 1 drop into both eyes at bedtime.    Yes Historical Provider, MD  metoprolol tartrate (LOPRESSOR) 25 MG tablet Take 0.5 tablets (12.5 mg total) by mouth 2 (two) times daily. 07/18/14  Yes Geradine Girt, DO  pravastatin (PRAVACHOL) 10 MG tablet Take 10 mg by mouth at bedtime.    Yes Historical Provider, MD  tamsulosin (FLOMAX) 0.4 MG CAPS capsule Take 0.4 mg by mouth daily. 10/21/15  Yes Historical Provider, MD  warfarin (COUMADIN) 5 MG tablet Take 5 mg by mouth daily at 6 PM. Hx A-Fib, per coumadin pharmacy protocol   Yes Historical Provider, MD    Allergies  Aureomycin [chlortetracycline]  I reviewed & agree with nursing's documentation on the patient's past medical, surgical, social & family histories as well as their allergies.  Review of Systems  Complete ROS obtained, and is negative except as stated in HPI.  Physical Exam  Updated Vital Signs BP 135/79 (BP Location: Left Arm)   Pulse (!) 108   Temp 97.5 F (36.4 C) (Oral)   Resp 20   Ht 6\' 1"  (1.854 m)   Wt 105.5 kg   SpO2 98%   BMI 30.69 kg/m  I have reviewed the triage vital signs and the nursing notes. Physical Exam CONST: Patient alert, well appearing, in no apparent distress.  EYES: PERRLA. EOMI. Conjunctiva w/o d/c. Lids AT w/o swelling.  ENMT: External Nares & Ears AT w/o  swelling. Oropharynx patent. MM moist. Left lower facial droop.  NECK: ROM full w/o rigidity. Trachea midline. JVD absent.  CVS: +S1/S2 w/o obvious murmur. Lower extremities w/o pitting edema.  RESP: Respiratory effort unlabored w/o retractions & accessory muscle use. BS clear bilaterally.  GI: Soft & ND. +BS x 4. TTP absent. Hernia absent. Guarding & Rebound absent.  BACK: CVA TTP absent bilaterally.  SKIN: Skin warm & dry. Turgor good. No rash.  PSYCH: Alert. Oriented. Affect and mood appropriate.  NEURO: CN II-XII Reveals left-sided lower facial droop without involvement of the left forehead otherwise grossly intact. Motor exam symmetric w/ upper & lower extremities 5/5 bilaterally. Sensation grossly intact. Finger to nose and heel to shin along with visual field testing normal.   MSK: Joints located & stable, w/o obvious dislocation & obvious deformity or crepitus absent w/ Cap refill < 2 sec. Peripheral pulses 2+ & equal in all extremities.   ED Treatments & Results   Labs (only abnormal results are displayed) Labs Reviewed  COMPREHENSIVE METABOLIC PANEL - Abnormal; Notable for the following:       Result Value   Sodium 134 (*)    CO2 21 (*)    Glucose, Bld 249 (*)    Calcium 8.7 (*)    Total Protein 6.4 (*)    Albumin 3.4 (*)    GFR calc non Af Amer 53 (*)    All other components within normal limits  URINALYSIS, ROUTINE W REFLEX MICROSCOPIC (NOT AT [facility]) - Abnormal; Notable for the following:    Glucose, UA >1000 (*)    Hgb urine dipstick TRACE (*)    All other components within normal limits  URINE MICROSCOPIC-ADD ON - Abnormal; Notable for the following:    Squamous Epithelial / LPF 0-5 (*)    Bacteria, UA RARE (*)    All other components within normal limits  TROPONIN I - Abnormal; Notable for the following:    Troponin I 0.40 (*)    All other components within normal limits  LIPID PANEL - Abnormal; Notable for the following:    HDL 30 (*)    All other components  within normal limits  COMPREHENSIVE METABOLIC PANEL - Abnormal; Notable for the following:    Glucose, Bld 131 (*)    Calcium 8.3 (*)    Total Protein 5.6 (*)    Albumin 3.0 (*)    ALT 15 (*)    All other components within normal limits  TROPONIN I - Abnormal; Notable for the following:    Troponin I 0.33 (*)    All other components within normal limits  TROPONIN I - Abnormal; Notable for the following:    Troponin I 0.28 (*)  All other components within normal limits  GLUCOSE, CAPILLARY - Abnormal; Notable for the following:    Glucose-Capillary 136 (*)    All other components within normal limits  GLUCOSE, CAPILLARY - Abnormal; Notable for the following:    Glucose-Capillary 129 (*)    All other components within normal limits  GLUCOSE, CAPILLARY - Abnormal; Notable for the following:    Glucose-Capillary 183 (*)    All other components within normal limits  GLUCOSE, CAPILLARY - Abnormal; Notable for the following:    Glucose-Capillary 118 (*)    All other components within normal limits  I-STAT TROPOININ, ED - Abnormal; Notable for the following:    Troponin i, poc 0.25 (*)    All other components within normal limits  I-STAT CHEM 8, ED - Abnormal; Notable for the following:    Glucose, Bld 244 (*)    Calcium, Ion 1.07 (*)    All other components within normal limits  URINE CULTURE  PROTIME-INR  APTT  CBC  DIFFERENTIAL  GLUCOSE, CAPILLARY  CBC  PROTIME-INR  HEMOGLOBIN A1C  CBC  BASIC METABOLIC PANEL    EKG    EKG Interpretation  Date/Time:  Sunday November 25 2015 18:32:34 EDT Ventricular Rate:  99 PR Interval:    QRS Duration: 100 QT Interval:  354 QTC Calculation: 455 R Axis:   46 Text Interpretation:  Atrial fibrillation No significant change since last tracing Confirmed by Ashok Cordia  MD, Lennette Bihari ([id number]) on 11/25/2015 6:37:46 PM       Radiology Dg Chest 2 View  Result Date: 11/25/2015 CLINICAL DATA:  Stroke. EXAM: CHEST  2 VIEW COMPARISON:   07/15/2014 FINDINGS: The heart size and mediastinal contours are within normal limits. Both lungs are clear. The visualized skeletal structures are unremarkable. IMPRESSION: No active cardiopulmonary disease. Electronically Signed   By: Fidela Salisbury M.D.   On: 11/25/2015 22:57   Ct Head Wo Contrast  Result Date: 11/25/2015 CLINICAL DATA:  Acute onset of slurred speech and right-sided facial droop. Initial encounter. EXAM: CT HEAD WITHOUT CONTRAST TECHNIQUE: Contiguous axial images were obtained from the base of the skull through the vertex without intravenous contrast. COMPARISON:  CT of the head performed 09/26/2013 FINDINGS: Brain: No evidence of acute infarction, hemorrhage, hydrocephalus, extra-axial collection or mass lesion/mass effect. Prominence of ventricles and sulci reflects mild to moderate cortical volume loss. Scattered periventricular and subcortical white matter change likely reflects small vessel ischemic microangiopathy. The brainstem and fourth ventricle are within normal limits. The basal ganglia are unremarkable in appearance. The cerebral hemispheres demonstrate grossly normal gray-white differentiation. No mass effect or midline shift is seen. Vascular: No hyperdense vessel or unexpected calcification. Skull: There is no evidence of fracture; visualized osseous structures are unremarkable in appearance. Sinuses/Orbits: The orbits are within normal limits. The paranasal sinuses and mastoid air cells are well-aerated. Other: No significant soft tissue abnormalities are seen. IMPRESSION: 1. No acute intracranial pathology seen on CT. 2. Mild to moderate cortical volume loss and scattered small vessel ischemic microangiopathy. Electronically Signed   By: Garald Balding M.D.   On: 11/25/2015 21:05   Mr Brain Wo Contrast  Result Date: 11/26/2015 CLINICAL DATA:  Slurred speech.  Left-sided facial drooping. EXAM: MRI HEAD WITHOUT CONTRAST MRA HEAD WITHOUT CONTRAST TECHNIQUE:  Multiplanar, multiecho pulse sequences of the brain and surrounding structures were obtained without intravenous contrast. Angiographic images of the head were obtained using MRA technique without contrast. COMPARISON:  Head CT 11/25/2015 FINDINGS: MRI HEAD FINDINGS Brain: There are multiple  foci of diffusion restriction, including along the left precentral gyrus, the superior anterior left parietal white matter and the left frontal operculum. These areas measure up to 8 mm. There is no evidence of acute hemorrhage. There is beginning confluent hyperintense T2-weighted signal within the periventricular white matter, most often seen in the setting of chronic microvascular ischemia. No mass lesion or midline shift. No hydrocephalus or extra-axial fluid collection. Vascular: Major intracranial arterial and venous sinus flow voids are preserved. No evidence of chronic microhemorrhage or amyloid angiopathy. Skull and upper cervical spine: The visualized skull base, calvarium, upper cervical spine and extracranial soft tissues are normal. Sinuses/Orbits: No fluid levels or advanced mucosal thickening. No mastoid effusion. Normal orbits. MRA HEAD FINDINGS Intracranial internal carotid arteries: Normal. Anterior cerebral arteries: Normal. Middle cerebral arteries: Normal. Posterior communicating arteries: Present bilaterally. Posterior cerebral arteries: Fetal predominant origin bilaterally. Basilar artery: Short segment loss of normal flow related enhancement within the midportion of the basilar artery, possibly artifactual. Vertebral arteries: Codominant. Normal. Superior cerebellar arteries: Normal. Anterior inferior cerebellar arteries: Not seen, which is not uncommon. Posterior inferior cerebellar arteries: Normal. IMPRESSION: 1. Multifocal small acute ischemic infarcts involving the left precentral gyrus, left frontal operculum and left parietal white matter. These findings are in keeping with right-sided weakness and  speech difficulties. 2. No hemorrhage or mass effect. 3. Possible moderate narrowing of the midportion of the basilar artery, though this may be partially due to artifact. Otherwise, no intracranial occlusion or high-grade stenosis. Electronically Signed   By: Ulyses Jarred M.D.   On: 11/26/2015 00:31   Mr Jodene Nam Head/brain [id number] Cm  Result Date: 11/26/2015 CLINICAL DATA:  Slurred speech.  Left-sided facial drooping. EXAM: MRI HEAD WITHOUT CONTRAST MRA HEAD WITHOUT CONTRAST TECHNIQUE: Multiplanar, multiecho pulse sequences of the brain and surrounding structures were obtained without intravenous contrast. Angiographic images of the head were obtained using MRA technique without contrast. COMPARISON:  Head CT 11/25/2015 FINDINGS: MRI HEAD FINDINGS Brain: There are multiple foci of diffusion restriction, including along the left precentral gyrus, the superior anterior left parietal white matter and the left frontal operculum. These areas measure up to 8 mm. There is no evidence of acute hemorrhage. There is beginning confluent hyperintense T2-weighted signal within the periventricular white matter, most often seen in the setting of chronic microvascular ischemia. No mass lesion or midline shift. No hydrocephalus or extra-axial fluid collection. Vascular: Major intracranial arterial and venous sinus flow voids are preserved. No evidence of chronic microhemorrhage or amyloid angiopathy. Skull and upper cervical spine: The visualized skull base, calvarium, upper cervical spine and extracranial soft tissues are normal. Sinuses/Orbits: No fluid levels or advanced mucosal thickening. No mastoid effusion. Normal orbits. MRA HEAD FINDINGS Intracranial internal carotid arteries: Normal. Anterior cerebral arteries: Normal. Middle cerebral arteries: Normal. Posterior communicating arteries: Present bilaterally. Posterior cerebral arteries: Fetal predominant origin bilaterally. Basilar artery: Short segment loss of normal flow  related enhancement within the midportion of the basilar artery, possibly artifactual. Vertebral arteries: Codominant. Normal. Superior cerebellar arteries: Normal. Anterior inferior cerebellar arteries: Not seen, which is not uncommon. Posterior inferior cerebellar arteries: Normal. IMPRESSION: 1. Multifocal small acute ischemic infarcts involving the left precentral gyrus, left frontal operculum and left parietal white matter. These findings are in keeping with right-sided weakness and speech difficulties. 2. No hemorrhage or mass effect. 3. Possible moderate narrowing of the midportion of the basilar artery, though this may be partially due to artifact. Otherwise, no intracranial occlusion or high-grade stenosis. Electronically Signed   By: Lennette Bihari  [place] M.D.   On: 11/26/2015 00:31    Pertinent labs & imaging results that were available during my care of the patient were independently visualized by me and considered in my medical decision making, please see chart for details.  Procedures (including critical care time) Procedures  Medications Ordered in ED Medications  tamsulosin (FLOMAX) capsule 0.4 mg (0.4 mg Oral Given by Other 11/26/15 1109)  metoprolol tartrate (LOPRESSOR) tablet 12.5 mg (12.5 mg Oral Given 11/26/15 2129)  insulin glargine (LANTUS) injection 45 Units (45 Units Subcutaneous Given 11/26/15 2131)  latanoprost (XALATAN) 0.005 % ophthalmic solution 1 drop (1 drop Both Eyes Given 11/26/15 2130)  0.9 %  sodium chloride infusion (50 mL/hr Intravenous Restarted 11/25/15 2319)  insulin aspart (novoLOG) injection 0-9 Units (2 Units Subcutaneous Given 11/26/15 1750)  aspirin EC tablet 325 mg (325 mg Oral Given by Other 11/26/15 1108)    Or  aspirin suppository 300 mg ( Rectal See Alternative 11/26/15 1108)  atorvastatin (LIPITOR) tablet 40 mg (40 mg Oral Given 11/26/15 1750)  rivaroxaban (XARELTO) tablet 20 mg (20 mg Oral Given 11/26/15 1750)   stroke: mapping our early stages of recovery book  ( Does not apply Given 11/25/15 2309)  warfarin (COUMADIN) tablet 7.5 mg (7.5 mg Oral Given 11/25/15 2305)  aspirin 325 MG tablet (325 mg  Given 11/25/15 2306)    Initial Impression & Plan / ED Course & Results / Final Disposition   Initial Impression & Plan Patient is high risk for CVA versus TIA due to diabetes hypertension along with atrial fibrillation. Patient is compliant with Coumadin however is noncompliant with diabetes medications and is only taking Lantus 40 mg daily. Patient states he stopped taking metformin which are believed to be responsible for the elevated blood glucose that he has had over the past several weeks. Upon arrival patient's blood glucose 300. Do not have concern for DKA or hyperglycemic nonketotic state at this time.  Due to lower left-sided facial asymmetry presentation concerning for TIA as patient's symptoms have markedly improved from previous. Patient also reports to the emergency department outside the safe TPA window and due to patient's other comorbidities and active Coumadin use patient is not a candidate for TPA. I obtained consultation from the neurology service who requested patient be admitted for TIA versus CVA evaluation and request MRI at this time.  ED Course & Results Urine studies reveal no acute infection. Laboratory work upon my review reveals elevated BG. CT head without contrast reviewed and I appreciate NAICA.   EKG consistent with known atrial fibrillation and no evidence of acute STEMI or were supposed depression concerning for ischemia. Unchanged from previous EKGs.  Final Disposition Reassessment of the patient reveals no acute concerns and patient is resting comfortably in bed however still has lower left-sided facial deficit however markedly improved from arrival and therefore requires evaluation in the hospital at this time.  In light of above & the complexity of the patient's medical condition, I believe the patient will require  admission for continued medical intervention. ED Course in its entirety was reviewed w/ the patient and relative(s). I therefore consulted the Hospitalist Service for admission & we discussed the patient's ED course & they have agreed to admit. They request no further interventions prior to the patient's transport from the ED. Patient stable for transport. Level of Care determined by the Admitting Service.  Final Clinical Impression & ED Diagnoses   1. Difficulty in walking, not elsewhere classified   2. Stroke (cerebrum) ([place])  3. Cerebral infarction due to embolism of left middle cerebral artery [facility])    Patient care discussed with the attending physician, Dr. Ashok Cordia, who oversaw their evaluation & treatment & voiced agreement.  Note: This document was prepared using Dragon voice recognition software and may include unintentional dictation errors.  House Officer: Voncille Lo, MD, Emergency Medicine Resident.   Voncille Lo, MD 11/26/15 2141    Lajean Saver, MD 12/03/15 [phone]

## 2015-11-25 NOTE — ED Notes (Signed)
To ct

## 2015-11-25 NOTE — Consult Note (Signed)
Admission H&P    Chief Complaint: Acute onset of slurred speech and right facial droop.  HPI: Charles Friedman is an 82 y.o. male history of atrial fibrillation on Coumadin, hyperlipidemia and diabetes mellitus presenting with new onset slurred speech and right facial droop. He was last known well at 2 PM today. There is no previous history of stroke or TIA. INR is pending. CT scan of the head is also pending. Deficits have improved since onset but are still present. NIH stroke score was 2. There was no weakness of right extremities.  LSN: 2:00 PM along 11/25/2015 tPA Given: No: Minimal deficits and beyond time window for treatment consideration mRankin:  Past Medical History:  Diagnosis Date  . Atrial fibrillation ([place])   . Cancer [facility])    prostate cancer  . Cataract    no surgery yet  . Diabetes mellitus without complication ([place])    type II  . Glaucoma    left eye  . Hx of radiation therapy 08/15/13- 10/06/13   prostate 7600 cGy 38 sessions  . Neuropathy ([facility])    diabetic, in feet  . Personal history of fall 09/26/13   front entrance of [place]    Past Surgical History:  Procedure Laterality Date  . PROSTATE BIOPSY  12/25/11   Adenocarcinoma, gleason 6    Family History  Problem Relation Age of Onset  . Cancer Sister 85    brain tumor died at age 76   Social History:  reports that he has never smoked. He has never used smokeless tobacco. He reports that he does not drink alcohol or use drugs.  Allergies:  Allergies  Allergen Reactions  . Aureomycin [Chlortetracycline] Itching and Rash    Reaction approximately 1956    Medications: Preadmission medications reviewed by me.  ROS: History obtained from the patient  General ROS: negative for - chills, fatigue, fever, night sweats, weight gain or weight loss Psychological ROS: negative for - behavioral disorder, hallucinations, memory difficulties, mood swings or suicidal ideation Ophthalmic ROS: negative for - blurry vision,  double vision, eye pain or loss of vision ENT ROS: negative for - epistaxis, nasal discharge, oral lesions, sore throat, tinnitus or vertigo Allergy and Immunology ROS: negative for - hives or itchy/watery eyes Hematological and Lymphatic ROS: negative for - bleeding problems, bruising or swollen lymph nodes Endocrine ROS: negative for - galactorrhea, hair pattern changes, polydipsia/polyuria or temperature intolerance Respiratory ROS: negative for - cough, hemoptysis, shortness of breath or wheezing Cardiovascular ROS: negative for - chest pain, dyspnea on exertion, edema or irregular heartbeat Gastrointestinal ROS: negative for - abdominal pain, diarrhea, hematemesis, nausea/vomiting or stool incontinence Genito-Urinary ROS: negative for - dysuria, hematuria, incontinence or urinary frequency/urgency Musculoskeletal ROS: negative for - joint swelling or muscular weakness Neurological ROS: as noted in HPI Dermatological ROS: negative for rash and skin lesion changes  Physical Examination: Blood pressure 134/86, pulse 80, resp. rate 20, height 6\' 1"  (1.854 m), weight 107 kg (236 lb), SpO2 97 %.  HEENT-  Normocephalic, no lesions, without obvious abnormality.  Normal external eye and conjunctiva.  Normal TM's bilaterally.  Normal auditory canals and external ears. Normal external nose, mucus membranes and septum.  Normal pharynx. Neck supple with no masses, nodes, nodules or enlargement. Cardiovascular - irregularly irregular rhythm, S1, S2 normal and no S3 or S4 Lungs - chest clear, no wheezing, rales, normal symmetric air entry Abdomen - soft, non-tender; bowel sounds normal; no masses,  no organomegaly Extremities - no joint deformities, effusion,  or inflammation  Neurologic Examination: Mental Status: Alert, oriented, thought content appropriate.  Speech was slightly slurred, without evidence of aphasia. Able to follow commands without difficulty. Cranial Nerves: II-Visual fields were  normal. III/IV/VI-Pupils were equal and reacted normally to light. Extraocular movements were full and conjugate.    V/VII-no facial numbness; mild right lower facial weakness. VIII-normal. X-normal speech and symmetrical palatal movement. XI: trapezius strength/neck flexion strength normal bilaterally XII-midline tongue extension with normal strength. Motor: 5/5 bilaterally with normal tone and bulk Sensory: Normal throughout. Deep Tendon Reflexes: Trace to 1+ and symmetric in upper extremities and absent in lower extremities. Plantars: Mute bilaterally Cerebellar: Normal finger-to-nose testing. Carotid auscultation: Normal  Results for orders placed or performed during the hospital encounter of 11/25/15 (from the past 48 hour(s))  I-Stat Chem 8, ED     Status: Abnormal   Collection Time: 11/25/15  7:57 PM  Result Value Ref Range   Sodium 137 135 - 145 mmol/L   Potassium 4.1 3.5 - 5.1 mmol/L   Chloride 103 101 - 111 mmol/L   BUN 18 6 - 20 mg/dL   Creatinine, Ser 1.20 0.61 - 1.24 mg/dL   Glucose, Bld 244 (H) 65 - 99 mg/dL   Calcium, Ion 1.07 (L) 1.15 - 1.40 mmol/L   TCO2 22 0 - 100 mmol/L   Hemoglobin 15.6 13.0 - 17.0 g/dL   HCT 46.0 39.0 - 52.0 %   No results found.  Assessment: 82 y.o. male with multiple risk factors for stroke presenting with probable acute left MCA territory small vessel ischemic infarction.  Stroke Risk Factors - atrial fibrillation, diabetes mellitus and hyperlipidemia  Plan: 1. HgbA1c, fasting lipid panel 2. MRI, MRA  of the brain without contrast 3. PT consult, OT consult, Speech consult 4. Echocardiogram 5. Carotid dopplers 6. Prophylactic therapy-Anticoagulation: Coumadin 7. Risk factor modification 8. Telemetry monitoring  C.R. Nicole Kindred, MD [facility] [phone]  11/25/2015, 8:07 PM

## 2015-11-25 NOTE — ED Notes (Signed)
36m called  Any questions  None reported by rn

## 2015-11-25 NOTE — ED Notes (Signed)
Mri will get the pt soon   Maybe in the next 30 minutes

## 2015-11-26 ENCOUNTER — Observation Stay (HOSPITAL_COMMUNITY): Payer: Medicare Other

## 2015-11-26 DIAGNOSIS — R4701 Aphasia: Secondary | ICD-10-CM | POA: Diagnosis present

## 2015-11-26 DIAGNOSIS — I63412 Cerebral infarction due to embolism of left middle cerebral artery: Principal | ICD-10-CM

## 2015-11-26 DIAGNOSIS — I10 Essential (primary) hypertension: Secondary | ICD-10-CM | POA: Diagnosis present

## 2015-11-26 DIAGNOSIS — E785 Hyperlipidemia, unspecified: Secondary | ICD-10-CM | POA: Diagnosis present

## 2015-11-26 DIAGNOSIS — Z923 Personal history of irradiation: Secondary | ICD-10-CM | POA: Diagnosis not present

## 2015-11-26 DIAGNOSIS — Z809 Family history of malignant neoplasm, unspecified: Secondary | ICD-10-CM | POA: Diagnosis not present

## 2015-11-26 DIAGNOSIS — I4891 Unspecified atrial fibrillation: Secondary | ICD-10-CM | POA: Diagnosis not present

## 2015-11-26 DIAGNOSIS — Z7984 Long term (current) use of oral hypoglycemic drugs: Secondary | ICD-10-CM | POA: Diagnosis not present

## 2015-11-26 DIAGNOSIS — Z8673 Personal history of transient ischemic attack (TIA), and cerebral infarction without residual deficits: Secondary | ICD-10-CM | POA: Diagnosis present

## 2015-11-26 DIAGNOSIS — I482 Chronic atrial fibrillation: Secondary | ICD-10-CM | POA: Diagnosis not present

## 2015-11-26 DIAGNOSIS — I639 Cerebral infarction, unspecified: Secondary | ICD-10-CM | POA: Diagnosis not present

## 2015-11-26 DIAGNOSIS — Z7901 Long term (current) use of anticoagulants: Secondary | ICD-10-CM | POA: Diagnosis not present

## 2015-11-26 DIAGNOSIS — Z794 Long term (current) use of insulin: Secondary | ICD-10-CM | POA: Diagnosis not present

## 2015-11-26 DIAGNOSIS — H409 Unspecified glaucoma: Secondary | ICD-10-CM | POA: Diagnosis present

## 2015-11-26 DIAGNOSIS — Z808 Family history of malignant neoplasm of other organs or systems: Secondary | ICD-10-CM | POA: Diagnosis not present

## 2015-11-26 DIAGNOSIS — H269 Unspecified cataract: Secondary | ICD-10-CM | POA: Diagnosis present

## 2015-11-26 DIAGNOSIS — R471 Dysarthria and anarthria: Secondary | ICD-10-CM | POA: Diagnosis present

## 2015-11-26 DIAGNOSIS — Z8546 Personal history of malignant neoplasm of prostate: Secondary | ICD-10-CM | POA: Diagnosis not present

## 2015-11-26 DIAGNOSIS — R29810 Facial weakness: Secondary | ICD-10-CM | POA: Diagnosis present

## 2015-11-26 DIAGNOSIS — E1165 Type 2 diabetes mellitus with hyperglycemia: Secondary | ICD-10-CM | POA: Diagnosis not present

## 2015-11-26 DIAGNOSIS — I6789 Other cerebrovascular disease: Secondary | ICD-10-CM | POA: Diagnosis not present

## 2015-11-26 DIAGNOSIS — G629 Polyneuropathy, unspecified: Secondary | ICD-10-CM | POA: Diagnosis present

## 2015-11-26 LAB — GLUCOSE, CAPILLARY
GLUCOSE-CAPILLARY: 129 mg/dL — AB (ref 65–99)
Glucose-Capillary: 136 mg/dL — ABNORMAL HIGH (ref 65–99)
Glucose-Capillary: 183 mg/dL — ABNORMAL HIGH (ref 65–99)
Glucose-Capillary: 118 mg/dL — ABNORMAL HIGH (ref 65–99)

## 2015-11-26 LAB — CBC
HEMATOCRIT: 41.3 % (ref 39.0–52.0)
Hemoglobin: 14.2 g/dL (ref 13.0–17.0)
MCH: 30.5 pg (ref 26.0–34.0)
MCHC: 34.4 g/dL (ref 30.0–36.0)
MCV: 88.6 fL (ref 78.0–100.0)
Platelets: 195 10*3/uL (ref 150–400)
RBC: 4.66 MIL/uL (ref 4.22–5.81)
RDW: 12.8 % (ref 11.5–15.5)
WBC: 6.8 10*3/uL (ref 4.0–10.5)

## 2015-11-26 LAB — VAS US CAROTID
LCCADDIAS: -11 cm/s
LCCADSYS: -62 cm/s
LCCAPDIAS: 17 cm/s
LCCAPSYS: 94 cm/s
LEFT ECA DIAS: -10 cm/s
LEFT VERTEBRAL DIAS: 11 cm/s
LICADDIAS: -9 cm/s
LICADSYS: -69 cm/s
LICAPSYS: -166 cm/s
Left ICA prox dias: -37 cm/s
RCCADSYS: -85 cm/s
RCCAPSYS: 82 cm/s
RIGHT ECA DIAS: -11 cm/s
RIGHT VERTEBRAL DIAS: -4 cm/s
Right CCA prox dias: 17 cm/s

## 2015-11-26 LAB — COMPREHENSIVE METABOLIC PANEL
ALT: 15 U/L — AB (ref 17–63)
AST: 17 U/L (ref 15–41)
Albumin: 3 g/dL — ABNORMAL LOW (ref 3.5–5.0)
Alkaline Phosphatase: 84 U/L (ref 38–126)
Anion gap: 7 (ref 5–15)
BILIRUBIN TOTAL: 0.9 mg/dL (ref 0.3–1.2)
BUN: 16 mg/dL (ref 6–20)
CO2: 24 mmol/L (ref 22–32)
CREATININE: 1.04 mg/dL (ref 0.61–1.24)
Calcium: 8.3 mg/dL — ABNORMAL LOW (ref 8.9–10.3)
Chloride: 106 mmol/L (ref 101–111)
Glucose, Bld: 131 mg/dL — ABNORMAL HIGH (ref 65–99)
POTASSIUM: 3.5 mmol/L (ref 3.5–5.1)
Sodium: 137 mmol/L (ref 135–145)
TOTAL PROTEIN: 5.6 g/dL — AB (ref 6.5–8.1)

## 2015-11-26 LAB — LIPID PANEL
CHOL/HDL RATIO: 4.9 ratio
CHOLESTEROL: 146 mg/dL (ref 0–200)
HDL: 30 mg/dL — AB (ref >40)
LDL Cholesterol: 95 mg/dL (ref 0–99)
Triglycerides: 105 mg/dL (ref <150)
VLDL: 21 mg/dL (ref 0–40)

## 2015-11-26 LAB — PROTIME-INR
INR: 1.18
PROTHROMBIN TIME: 15.1 s (ref 11.4–15.2)

## 2015-11-26 LAB — TROPONIN I
TROPONIN I: 0.4 ng/mL — AB (ref <0.03)
Troponin I: 0.33 ng/mL (ref <0.03)
Troponin I: 0.28 ng/mL (ref <0.03)

## 2015-11-26 MED ORDER — WARFARIN SODIUM 7.5 MG PO TABS: 7.5000 mg | ORAL_TABLET | Freq: Once | ORAL | Status: DC

## 2015-11-26 MED ORDER — ATORVASTATIN CALCIUM 40 MG PO TABS
40.0000 mg | ORAL_TABLET | Freq: Every day | ORAL | Status: DC
  Administered 2015-11-26: 40 mg via ORAL
  Filled 2015-11-26: qty 1

## 2015-11-26 MED ORDER — RIVAROXABAN 20 MG PO TABS
20.0000 mg | ORAL_TABLET | Freq: Every day | ORAL | Status: DC
  Administered 2015-11-26 – 2015-11-27 (×2): 20 mg via ORAL
  Filled 2015-11-26 (×2): qty 1

## 2015-11-26 NOTE — Progress Notes (Signed)
PROGRESS NOTE    Charles Friedman  [id number] DOB: [birth year] DOA: 11/25/2015 PCP: Haywood Pao, MD   Outpatient Specialists:     Brief Narrative:  Charles Friedman is a 82 y.o. male with hx of A-fib which he is on coumadin, and DM type 2 presents to the ED with complaints of slurred speech onset early this evening. Per patients family, they noticed his slurred around 4 pm this evening when he was on the phone with them. They immediately came to his house where they noticed he had left sided facial drooping in addition to the slurred speech. They called the EMS immediately and he was transferred to the ED. CT of the head did not show anything acute. On-call neurologist Dr. Wallie Char was consulted. Patient was not felt to be a candidate for TPA due to minimal deficits. In addition to his blood sugar is found to be elevated more than 100 but not in DKA. He missed his dose of lantus 40 units for his diabetes last night, but he did take a dose of lantus this afternoon. He denies any extremity weakness, vision changes, difficulty swallowing, and headaches. He also denies nausea, vomiting, and CP.    Assessment & Plan:   Principal Problem:   Cerebral infarction [facility]) Active Problems:   Prostate cancer ([place])   Type 2 diabetes mellitus with hyperglycemia (HCC)   Atrial fibrillation (HCC)   Stroke (cerebrum) (HCC)   stroke - slurred speech with facial drooping.  -CT head was unremarkable.  -MRI/MRA of brain -2-D echo -carotid Dopplers -Appreciate neurology consult -Patient on Coumadin but subtherapeutic -PT/OT -add lipitor for LDL of 95 -- goal < 70  Elevated troponin -cycle -denies CP -monitor- echo pending- if abnormal will get cards consult  DM type 2 with hyperglycemia -  -SSI -recent HgbA1C in the 7s  Chronic A-fib -  -coumadin- subtherapeutic -metoprolol- tele shows sinus with pauses -Chads 2 vasc score of 6.   HTN. Stable - Continue  metoprolol. Allow for permissive  hypertension. Monitor closely.   DVT prophylaxis:  coumadin  Code Status: Full Code   Family Communication: patient  Disposition Plan:     Consultants:   neuro     Subjective: Still with slurred speech and word finding difficulties  Objective: Vitals:   11/26/15 0308 11/26/15 0500 11/26/15 0706 11/26/15 0900  BP: (!) 105/58 108/64 123/64 (!) 127/97  Pulse: 69 75 80 80  Resp: 16 14 14    Temp: 97.8 F (36.6 C) 98 F (36.7 C) 97.6 F (36.4 C) 97.8 F (36.6 C)  TempSrc: Oral Oral Oral Oral  SpO2: 97% 97% 98% 100%  Weight:      Height:       No intake or output data in the 24 hours ending 11/26/15 0951 Filed Weights   11/25/15 1831 11/25/15 2259  Weight: 107 kg (236 lb) 105.5 kg (232 lb 9.4 oz)    Examination:  General exam: Appears calm and comfortable  Respiratory system: Clear to auscultation. Respiratory effort normal. Cardiovascular system: S1 & S2 heard, RRR. No JVD, murmurs, rubs, gallops or clicks. No pedal edema. Gastrointestinal system: Abdomen is nondistended, soft and nontender. No organomegaly or masses felt. Normal bowel sounds heard. Central nervous system: Alert -- some speech difficulty  Tele with pauses   Data Reviewed: I have personally reviewed following labs and imaging studies  CBC:  Recent Labs Lab 11/25/15 1948 11/25/15 1957 11/26/15 0603  WBC 8.4  --  6.8  NEUTROABS  5.7  --   --   HGB 15.5 15.6 14.2  HCT 44.1 46.0 41.3  MCV 88.4  --  88.6  PLT 224  --  0000000   Basic Metabolic Panel:  Recent Labs Lab 11/25/15 1948 11/25/15 1957 11/26/15 0603  NA 134* 137 137  K 4.1 4.1 3.5  CL 103 103 106  CO2 21*  --  24  GLUCOSE 249* 244* 131*  BUN 16 18 16   CREATININE 1.23 1.20 1.04  CALCIUM 8.7*  --  8.3*   GFR: Estimated Creatinine Clearance: 71 mL/min (by C-G formula based on SCr of 1.04 mg/dL). Liver Function Tests:  Recent Labs Lab 11/25/15 1948 11/26/15 0603  AST 19 17  ALT 17 15*  ALKPHOS 103 84  BILITOT  0.7 0.9  PROT 6.4* 5.6*  ALBUMIN 3.4* 3.0*   No results for input(s): LIPASE, AMYLASE in the last 168 hours. No results for input(s): AMMONIA in the last 168 hours. Coagulation Profile:  Recent Labs Lab 11/25/15 1948 11/26/15 0603  INR 1.17 1.18   Cardiac Enzymes:  Recent Labs Lab 11/25/15 2320 11/26/15 0603  TROPONINI 0.40* 0.33*   BNP (last 3 results) No results for input(s): PROBNP in the last 8760 hours. HbA1C: No results for input(s): HGBA1C in the last 72 hours. CBG:  Recent Labs Lab 11/25/15 2314 11/26/15 0621  GLUCAP 88 136*   Lipid Profile:  Recent Labs  11/26/15 0603  CHOL 146  HDL 30*  LDLCALC 95  TRIG 105  CHOLHDL 4.9   Thyroid Function Tests: No results for input(s): TSH, T4TOTAL, FREET4, T3FREE, THYROIDAB in the last 72 hours. Anemia Panel: No results for input(s): VITAMINB12, FOLATE, FERRITIN, TIBC, IRON, RETICCTPCT in the last 72 hours. Urine analysis:    Component Value Date/Time   COLORURINE YELLOW 11/25/2015 1937   APPEARANCEUR CLEAR 11/25/2015 1937   LABSPEC 1.015 11/25/2015 1937   PHURINE 5.5 11/25/2015 1937   GLUCOSEU >1000 (A) 11/25/2015 1937   HGBUR TRACE (A) 11/25/2015 1937   BILIRUBINUR NEGATIVE 11/25/2015 1937   KETONESUR NEGATIVE 11/25/2015 1937   PROTEINUR NEGATIVE 11/25/2015 1937   UROBILINOGEN 0.2 07/15/2014 2038   NITRITE NEGATIVE 11/25/2015 1937   LEUKOCYTESUR NEGATIVE 11/25/2015 1937     )No results found for this or any previous visit (from the past 240 hour(s)).    Anti-infectives    None       Radiology Studies: Dg Chest 2 View  Result Date: 11/25/2015 CLINICAL DATA:  Stroke. EXAM: CHEST  2 VIEW COMPARISON:  07/15/2014 FINDINGS: The heart size and mediastinal contours are within normal limits. Both lungs are clear. The visualized skeletal structures are unremarkable. IMPRESSION: No active cardiopulmonary disease. Electronically Signed   By: Fidela Salisbury M.D.   On: 11/25/2015 22:57   Ct Head Wo  Contrast  Result Date: 11/25/2015 CLINICAL DATA:  Acute onset of slurred speech and right-sided facial droop. Initial encounter. EXAM: CT HEAD WITHOUT CONTRAST TECHNIQUE: Contiguous axial images were obtained from the base of the skull through the vertex without intravenous contrast. COMPARISON:  CT of the head performed 09/26/2013 FINDINGS: Brain: No evidence of acute infarction, hemorrhage, hydrocephalus, extra-axial collection or mass lesion/mass effect. Prominence of ventricles and sulci reflects mild to moderate cortical volume loss. Scattered periventricular and subcortical white matter change likely reflects small vessel ischemic microangiopathy. The brainstem and fourth ventricle are within normal limits. The basal ganglia are unremarkable in appearance. The cerebral hemispheres demonstrate grossly normal gray-white differentiation. No mass effect or midline shift is  seen. Vascular: No hyperdense vessel or unexpected calcification. Skull: There is no evidence of fracture; visualized osseous structures are unremarkable in appearance. Sinuses/Orbits: The orbits are within normal limits. The paranasal sinuses and mastoid air cells are well-aerated. Other: No significant soft tissue abnormalities are seen. IMPRESSION: 1. No acute intracranial pathology seen on CT. 2. Mild to moderate cortical volume loss and scattered small vessel ischemic microangiopathy. Electronically Signed   By: Garald Balding M.D.   On: 11/25/2015 21:05   Mr Brain Wo Contrast  Result Date: 11/26/2015 CLINICAL DATA:  Slurred speech.  Left-sided facial drooping. EXAM: MRI HEAD WITHOUT CONTRAST MRA HEAD WITHOUT CONTRAST TECHNIQUE: Multiplanar, multiecho pulse sequences of the brain and surrounding structures were obtained without intravenous contrast. Angiographic images of the head were obtained using MRA technique without contrast. COMPARISON:  Head CT 11/25/2015 FINDINGS: MRI HEAD FINDINGS Brain: There are multiple foci of diffusion  restriction, including along the left precentral gyrus, the superior anterior left parietal white matter and the left frontal operculum. These areas measure up to 8 mm. There is no evidence of acute hemorrhage. There is beginning confluent hyperintense T2-weighted signal within the periventricular white matter, most often seen in the setting of chronic microvascular ischemia. No mass lesion or midline shift. No hydrocephalus or extra-axial fluid collection. Vascular: Major intracranial arterial and venous sinus flow voids are preserved. No evidence of chronic microhemorrhage or amyloid angiopathy. Skull and upper cervical spine: The visualized skull base, calvarium, upper cervical spine and extracranial soft tissues are normal. Sinuses/Orbits: No fluid levels or advanced mucosal thickening. No mastoid effusion. Normal orbits. MRA HEAD FINDINGS Intracranial internal carotid arteries: Normal. Anterior cerebral arteries: Normal. Middle cerebral arteries: Normal. Posterior communicating arteries: Present bilaterally. Posterior cerebral arteries: Fetal predominant origin bilaterally. Basilar artery: Short segment loss of normal flow related enhancement within the midportion of the basilar artery, possibly artifactual. Vertebral arteries: Codominant. Normal. Superior cerebellar arteries: Normal. Anterior inferior cerebellar arteries: Not seen, which is not uncommon. Posterior inferior cerebellar arteries: Normal. IMPRESSION: 1. Multifocal small acute ischemic infarcts involving the left precentral gyrus, left frontal operculum and left parietal white matter. These findings are in keeping with right-sided weakness and speech difficulties. 2. No hemorrhage or mass effect. 3. Possible moderate narrowing of the midportion of the basilar artery, though this may be partially due to artifact. Otherwise, no intracranial occlusion or high-grade stenosis. Electronically Signed   By: Ulyses Jarred M.D.   On: 11/26/2015 00:31   Mr  Jodene Nam Head/brain [id number] Cm  Result Date: 11/26/2015 CLINICAL DATA:  Slurred speech.  Left-sided facial drooping. EXAM: MRI HEAD WITHOUT CONTRAST MRA HEAD WITHOUT CONTRAST TECHNIQUE: Multiplanar, multiecho pulse sequences of the brain and surrounding structures were obtained without intravenous contrast. Angiographic images of the head were obtained using MRA technique without contrast. COMPARISON:  Head CT 11/25/2015 FINDINGS: MRI HEAD FINDINGS Brain: There are multiple foci of diffusion restriction, including along the left precentral gyrus, the superior anterior left parietal white matter and the left frontal operculum. These areas measure up to 8 mm. There is no evidence of acute hemorrhage. There is beginning confluent hyperintense T2-weighted signal within the periventricular white matter, most often seen in the setting of chronic microvascular ischemia. No mass lesion or midline shift. No hydrocephalus or extra-axial fluid collection. Vascular: Major intracranial arterial and venous sinus flow voids are preserved. No evidence of chronic microhemorrhage or amyloid angiopathy. Skull and upper cervical spine: The visualized skull base, calvarium, upper cervical spine and extracranial soft tissues are normal. Sinuses/Orbits:  No fluid levels or advanced mucosal thickening. No mastoid effusion. Normal orbits. MRA HEAD FINDINGS Intracranial internal carotid arteries: Normal. Anterior cerebral arteries: Normal. Middle cerebral arteries: Normal. Posterior communicating arteries: Present bilaterally. Posterior cerebral arteries: Fetal predominant origin bilaterally. Basilar artery: Short segment loss of normal flow related enhancement within the midportion of the basilar artery, possibly artifactual. Vertebral arteries: Codominant. Normal. Superior cerebellar arteries: Normal. Anterior inferior cerebellar arteries: Not seen, which is not uncommon. Posterior inferior cerebellar arteries: Normal. IMPRESSION: 1. Multifocal  small acute ischemic infarcts involving the left precentral gyrus, left frontal operculum and left parietal white matter. These findings are in keeping with right-sided weakness and speech difficulties. 2. No hemorrhage or mass effect. 3. Possible moderate narrowing of the midportion of the basilar artery, though this may be partially due to artifact. Otherwise, no intracranial occlusion or high-grade stenosis. Electronically Signed   By: Ulyses Jarred M.D.   On: 11/26/2015 00:31        Scheduled Meds: . aspirin EC  325 mg Oral Daily   Or  . aspirin  300 mg Rectal Daily  . insulin aspart  0-9 Units Subcutaneous TID WC  . insulin glargine  45 Units Subcutaneous QHS  . latanoprost  1 drop Both Eyes QHS  . metoprolol tartrate  12.5 mg Oral BID  . pravastatin  10 mg Oral QHS  . tamsulosin  0.4 mg Oral Daily  . Warfarin - Pharmacist Dosing Inpatient   Does not apply q1800   Continuous Infusions: . sodium chloride 50 mL/hr (11/25/15 2319)     LOS: 0 days    Time spent: 35 min    [place], DO [facility] Pager [phone]  If 7PM-7AM, please contact night-coverage [email or web address] Password [email or web address] 11/26/2015, 9:51 AM

## 2015-11-26 NOTE — Progress Notes (Signed)
OT Screen  Note  Patient Details Name: DONNELL BURDON MRN: [id number] DOB: 05-14-1932   Cancelled Treatment:    Reason Eval/Treat Not Completed: OT screened, no needs identified, will sign off  Per PT evaluation patient is at baseline for adls per family present. Pt does not require OT acutely.   Vonita Moss   OTR/L Pager: [phone] Office: [phone] .   11/26/2015, 2:08 PM

## 2015-11-26 NOTE — Progress Notes (Signed)
CRITICAL VALUE ALERT  Critical value received: troponin  Date of notification:  11/26/15  Time of notification:  00:20  Critical value read back:Yes.    Nurse who received alert:  Jacquelynn Cree  MD notified (1st page):  Lamar Blinks (NP)  Time of first page:  00:40  MD notified (2nd page):  Time of second page:  Responding [id number]  Time MD responded: n/a*

## 2015-11-26 NOTE — Progress Notes (Signed)
STROKE TEAM PROGRESS NOTE   HISTORY OF PRESENT ILLNESS (per record) Charles Friedman is an 82 y.o. male history of atrial fibrillation on Coumadin, hyperlipidemia and diabetes mellitus presenting with new onset slurred speech and right facial droop. He was last known well at 2 PM today 11/25/2015 ([facility]). There is no previous history of stroke or TIA. INR is pending. CT scan of the head is also pending. Deficits have improved since onset but are still present. NIH stroke score was 2. There was no weakness of right extremities. Patient was not administered IV t-PA secondary to minimal deficits and beyond time window for treatment consideration. He was admitted for further evaluation and treatment.   SUBJECTIVE (INTERVAL HISTORY) His son is at the bedside.  Overall he feels his condition is gradually improving. He still has right facial droop and slurry speech but neuro stable. On coumadin at home, but INR fluctuate over time. His last check was in middle of September and it was 3.1. However, on admission it was 1.17 and this morning still at 1.18. I talked with son and pt and they are Ok to switch to DOACs and they prefer once a day medication.    OBJECTIVE Temp:  [97.6 F (36.4 C)-98.5 F (36.9 C)] 97.6 F (36.4 C) (10/09 0706) Pulse Rate:  [69-97] 80 (10/09 0706) Cardiac Rhythm: Atrial fibrillation (10/09 0059) Resp:  [14-22] 14 (10/09 0706) BP: (105-154)/(55-105) 123/64 (10/09 0706) SpO2:  [95 %-98 %] 98 % (10/09 0706) Weight:  [105.5 kg (232 lb 9.4 oz)-107 kg (236 lb)] 105.5 kg (232 lb 9.4 oz) (10/08 2259)  CBC:  Recent Labs Lab 11/25/15 1948 11/25/15 1957 11/26/15 0603  WBC 8.4  --  6.8  NEUTROABS 5.7  --   --   HGB 15.5 15.6 14.2  HCT 44.1 46.0 41.3  MCV 88.4  --  88.6  PLT 224  --  0000000    Basic Metabolic Panel:  Recent Labs Lab 11/25/15 1948 11/25/15 1957 11/26/15 0603  NA 134* 137 137  K 4.1 4.1 3.5  CL 103 103 106  CO2 21*  --  24  GLUCOSE 249* 244* 131*  BUN 16 18 16    CREATININE 1.23 1.20 1.04  CALCIUM 8.7*  --  8.3*    Lipid Panel:    Component Value Date/Time   CHOL 146 11/26/2015 0603   TRIG 105 11/26/2015 0603   HDL 30 (L) 11/26/2015 0603   CHOLHDL 4.9 11/26/2015 0603   VLDL 21 11/26/2015 0603   LDLCALC 95 11/26/2015 0603   HgbA1c:  Lab Results  Component Value Date   HGBA1C 9.6 (H) 07/18/2014   Urine Drug Screen: No results found for: LABOPIA, COCAINSCRNUR, LABBENZ, AMPHETMU, THCU, LABBARB    IMAGING I have personally reviewed the radiological images below and agree with the radiology interpretations.  Dg Chest 2 View 11/25/2015 No active cardiopulmonary disease.   Ct Head Wo Contrast 11/25/2015 1. No acute intracranial pathology seen on CT. 2. Mild to moderate cortical volume loss and scattered small vessel ischemic microangiopathy.   Mri Brain and Mra Head 11/26/2015 1. Multifocal small acute ischemic infarcts involving the left precentral gyrus, left frontal operculum and left parietal white matter. These findings are in keeping with right-sided weakness and speech difficulties. 2. No hemorrhage or mass effect. 3. Possible moderate narrowing of the midportion of the basilar artery, though this may be partially due to artifact. Otherwise, no intracranial occlusion or high-grade stenosis.   CUS - Bilateral: 1-39% ICA stenosis.  Vertebral artery flow is antegrade.  TTE - pending   PHYSICAL EXAM  Temp:  [97.6 F (36.4 C)-98.5 F (36.9 C)] 98.2 F (36.8 C) (10/09 1428) Pulse Rate:  [68-97] 75 (10/09 1428) Resp:  [14-22] 20 (10/09 1428) BP: (105-154)/(55-105) 123/64 (10/09 1428) SpO2:  [94 %-100 %] 94 % (10/09 1428) Weight:  [232 lb 9.4 oz (105.5 kg)-236 lb (107 kg)] 232 lb 9.4 oz (105.5 kg) (10/08 2259)  General - Well nourished, well developed, in no apparent distress.  Ophthalmologic - Fundi not visualized due to noncooperation.  Cardiovascular - irregularly irregular heart rate and rhythm.  Mental Status -  Level of  arousal and orientation to time, place, and person were intact. Language including expression, naming, repetition, comprehension was assessed and found intact, but dysarthria Fund of Knowledge was assessed and was intact.  Cranial Nerves II - XII - II - Visual field intact OU. III, IV, VI - Extraocular movements intact. V - Facial sensation intact bilaterally. VII - Facial movement showed right mild facial droop. VIII - Hearing & vestibular intact bilaterally. X - Palate elevates symmetrically, dysarthria. XI - Chin turning & shoulder shrug intact bilaterally. XII - Tongue protrusion towards to the right.  Motor Strength - The patient's strength was normal in all extremities and pronator drift was absent.  Bulk was normal and fasciculations were absent.   Motor Tone - Muscle tone was assessed at the neck and appendages and was normal.  Reflexes - The patient's reflexes were 1+ in all extremities and he had no pathological reflexes.  Sensory - Light touch, temperature/pinprick were assessed and were symmetrical.    Coordination - The patient had normal movements in the hands and feet with no ataxia or dysmetria.  Tremor was absent.  Gait and Station - deferred   ASSESSMENT/PLAN Charles Friedman is a 82 y.o. male with history of atrial fibrillation on Coumadin, hyperlipidemia and diabetes mellitus presenting with slurred speech and right facial droop. He did not receive IV t-PA due to minimal deficits and beyond time window for treatment consideration.   Stroke:  left MCA territory small cortical infarcts embolic secondary to afib with subtherapeutic INR  Resultant  Right facial droop and dysarthria  MRI  Small L MCA infarcts L precentral gyrus, L frontal operculum, and L parietal white matter  MRA  Possible BA narrowing, o/w no intracranial occlusion/high-grade stenosis  Carotid Doppler  unremarkable  2D Echo  pending  LDL 95  HgbA1c pending  SCDs for VTE prophylaxis  Diet  heart healthy/carb modified Room service appropriate? Yes; Fluid consistency: Thin  warfarin daily prior to admission, now on aspirin 325 mg daily and warfarin daily. Discussed with pt and son, they are in agreement to switch to DOACs. They prefer once daily dosing, so Xarelto is recommended at this time. Pt stroke is small and INR today < 2, Xarelto can be started tonight.   Patient counseled to be compliant with his antithrombotic medications  Ongoing aggressive stroke risk factor management  Therapy recommendations:  pending  Disposition:  pending  Atrial Fibrillation  Home anticoagulation:  warfarin daily continued in the hospital  INR 1.17 on admission, fluctuate at home  Recommend to switch to DOACs. Pt prefer Xarelto.   Continue Xarelto at discharge    Hypertension  Stable  Permissive hypertension (OK if < 180/105) but gradually normalize in 5-7 days  Long-term BP goal normotensive  Hyperlipidemia  Home meds:  pravachol 10, resumed in hospital  LDL 95, goal <  60  Switched to lipitor 40mg   Continue statin at discharge  Diabetes type II  HgbA1c pending, goal < 7.0  Controlled  CBG stable  SSI  On lantus  Other Stroke Risk Factors  Advanced age  Obesity, Body mass index is 30.69 kg/m., recommend weight loss, diet and exercise as appropriate   Other Active Problems  Prostate cancer s/p radiation  Glaucoma   Hospital day # 0  Neurology will sign off. Please call with questions. Pt will follow up with Cecille Rubin NP at [facility] in about 6 weeks. Thanks for the consult.  Rosalin Hawking, MD PhD Stroke Neurology 11/26/2015 3:21 PM

## 2015-11-26 NOTE — Progress Notes (Addendum)
ANTICOAGULATION CONSULT NOTE - Follow Up Consult  Pharmacy Consult for warfarin Indication: atrial fibrillation and stroke  Allergies  Allergen Reactions  . Aureomycin [Chlortetracycline] Itching and Rash    Reaction approximately 1956    Patient Measurements: Height: 6\' 1"  (185.4 cm) Weight: 232 lb 9.4 oz (105.5 kg) IBW/kg (Calculated) : 79.9  Vital Signs: Temp: 97.8 F (36.6 C) (10/09 0900) Temp Source: Oral (10/09 0900) BP: 127/97 (10/09 0900) Pulse Rate: 80 (10/09 0900)  Labs:  Recent Labs  11/25/15 1948 11/25/15 1957 11/25/15 2320 11/26/15 0603  HGB 15.5 15.6  --  14.2  HCT 44.1 46.0  --  41.3  PLT 224  --   --  195  APTT 26  --   --   --   LABPROT 15.0  --   --  15.1  INR 1.17  --   --  1.18  CREATININE 1.23 1.20  --  1.04  TROPONINI  --   --  0.40* 0.33*    Estimated Creatinine Clearance: 71 mL/min (by C-G formula based on SCr of 1.04 mg/dL).  Assessment: 82 y.o. M presents with slurred speech and R facial droop. MRI shows multiple small acute ischemic infarcts. Pt on warfarin PTA for afib. Admission INR subtherapeutic - ?taking as prescribed PTA.  INR is subtherapeutic today at 1.18. No bleeding noted, CBC is stable.  Home dose 5mg  daily - last taken per pt on 10/7  Goal of Therapy:  INR 2-3 Monitor platelets by anticoagulation protocol: Yes   Plan:  1. Repeat warfarin 7.5 mg PO tonight 2. Daily INR 3. Monitor for s/sx of bleeding 4. Consider changing to a more potent statin like atorvastatin to meet LDL goal <70 5. Consider reducing aspirin dose to 81 mg once INR >2  Renold Genta, PharmD, BCPS Clinical Pharmacist Phone for today - [place] - [phone] 11/26/2015 10:26 AM    Addendum: Pharmacy consulted to switch from warfarin to Xarelto.   D/c warfarin Xarelto 20 mg PO daily with supper Education prior to discharge Consider reducing aspirin dose to 81 mg   Pharmacy will sign off but will follow peripherally  Renold Genta, PharmD, BCPS Clinical Pharmacist Phone for today - [place] - [phone] 11/26/2015 3:19 PM

## 2015-11-26 NOTE — Evaluation (Signed)
Physical Therapy Evaluation Patient Details Name: Charles Friedman MRN: [id number] DOB: 1932-09-12 Today's Date: 11/26/2015   History of Present Illness  82 yo admitted with slurred speech and facial droop with small infarcts of left precentral gyrus. PMhx: DM, prostate CA, Afib, neuropathy  Clinical Impression  Pt pleasant with noted slurred speech and left facial droop still present. Pt lives alone with stairs to laundry and uses a RW most of the time. Pt aware of balance deficits and continued need for RW with report of no falls since 2015. Pt educated for removal of throw rugs, utilization of RW in kitchen and use of tray to prevent furniture walking, need to back to surface with DME, posture with gait and need for increased ambulation. Pt with decreased gait and activity tolerance which is baseline for pt but would benefit from acute therapy to maximize endurance, safety and balance to decrease fall risk as well as HHPT for safety assessment and increased functional capacity. Son present and in agreement along with pt. Recommend daily mobility with staff.     Follow Up Recommendations Home health PT;Supervision - Intermittent    Equipment Recommendations  None recommended by PT    Recommendations for Other Services       Precautions / Restrictions Precautions Precautions: Fall      Mobility  Bed Mobility Overal bed mobility: Modified Independent             General bed mobility comments: with momentum to achieve  Transfers Overall transfer level: Needs assistance   Transfers: Sit to/from Stand Sit to Stand: Supervision         General transfer comment: cues for safety and backing to surface, pt tends to ditch RW at 90degree angle to sit and plop  Ambulation/Gait Ambulation/Gait assistance: Min guard Ambulation Distance (Feet): 75 Feet Assistive device: Rolling walker (2 wheeled) Gait Pattern/deviations: Step-through pattern;Decreased stride length;Trunk flexed    Gait velocity interpretation: Below normal speed for age/gender General Gait Details: pt with cues for posture and position in RW throughout gait, pt maintains flexed stance and fatigued after 75' and practicing stairs with need to sit before return 42' to room. Pt reports 21' is about average for him before fatigue at home  Stairs Stairs: Yes Stairs assistance: Modified independent (Device/Increase time) Stair Management: Step to pattern;Sideways;One rail Right Number of Stairs: 6 General stair comments: pt able to complete safely with bil hands on Right rail   Wheelchair Mobility    Modified Rankin (Stroke Patients Only) Modified Rankin (Stroke Patients Only) Pre-Morbid Rankin Score: Moderate disability Modified Rankin: Moderate disability     Balance Overall balance assessment: Needs assistance   Sitting balance-Leahy Scale: Good       Standing balance-Leahy Scale: Poor                               Pertinent Vitals/Pain Pain Assessment: No/denies pain    Home Living Family/patient expects to be discharged to:: Private residence Living Arrangements: Alone Available Help at Discharge: Family;Available PRN/intermittently Type of Home: House Home Access: Stairs to enter Entrance Stairs-Rails: Right Entrance Stairs-Number of Steps: 5 Home Layout: Multi-level;Laundry or work area in [place]: Environmental consultant - 2 wheels;Shower seat;Bedside commode;Grab bars - tub/shower;Grab bars - toilet Additional Comments: pt has 4 walkers, one in the car and one on each level of his home    Prior Function Level of Independence: Independent with assistive device(s)  Comments: uses RW most of the time but admittedly furniture walks in the kitchen, housekeeper 1x/month, sometimes assist for laundry as he has to carry it up one item at a time to hold the rail      Hand Dominance        Extremity/Trunk Assessment   Upper Extremity Assessment: Overall  WFL for tasks assessed           Lower Extremity Assessment: Overall WFL for tasks assessed (4+/5 all myotomes and baseline peripheral neuropathy)      Cervical / Trunk Assessment: Kyphotic  Communication   Communication: No difficulties;Other (comment) (slurred speech but able to communicate clearly)  Cognition Arousal/Alertness: Awake/alert Behavior During Therapy: WFL for tasks assessed/performed Overall Cognitive Status: Within Functional Limits for tasks assessed                      General Comments      Exercises     Assessment/Plan    PT Assessment Patient needs continued PT services  PT Problem List Decreased mobility;Decreased activity tolerance;Decreased balance;Decreased knowledge of use of DME          PT Treatment Interventions Gait training;Functional mobility training;Therapeutic exercise;Therapeutic activities;Balance training;Patient/family education;DME instruction    PT Goals (Current goals can be found in the Care Plan section)  Acute Rehab PT Goals Patient Stated Goal: return home PT Goal Formulation: With patient/family Time For Goal Achievement: 12/03/15 Potential to Achieve Goals: Good    Frequency Min 3X/week   Barriers to discharge Decreased caregiver support      Co-evaluation               End of Session Equipment Utilized During Treatment: Gait belt Activity Tolerance: Patient tolerated treatment well Patient left: in chair;with call bell/phone within reach;with chair alarm set;with family/visitor present           Time: [id number] PT Time Calculation (min) (ACUTE ONLY): 34 min   Charges:   PT Evaluation $PT Eval Moderate Complexity: 1 Procedure PT Treatments $Self Care/Home Management: 8-22   PT G Codes:        Melford Aase 11/26/2015, 2:21 PM Elwyn Reach, [place]

## 2015-11-26 NOTE — Progress Notes (Signed)
VASCULAR LAB PRELIMINARY  PRELIMINARY  PRELIMINARY  PRELIMINARY  Carotid duplex completed.    Preliminary report:  1-39% ICA plaquing.  Vertebral artery flow is antegrade.   Koichi Platte, RVT 11/26/2015, 10:53 AM

## 2015-11-27 ENCOUNTER — Inpatient Hospital Stay (HOSPITAL_COMMUNITY): Payer: Medicare Other

## 2015-11-27 DIAGNOSIS — I482 Chronic atrial fibrillation: Secondary | ICD-10-CM

## 2015-11-27 DIAGNOSIS — Z794 Long term (current) use of insulin: Secondary | ICD-10-CM

## 2015-11-27 DIAGNOSIS — I6789 Other cerebrovascular disease: Secondary | ICD-10-CM

## 2015-11-27 LAB — BASIC METABOLIC PANEL
Anion gap: 11 (ref 5–15)
BUN: 18 mg/dL (ref 6–20)
CALCIUM: 8.5 mg/dL — AB (ref 8.9–10.3)
CHLORIDE: 105 mmol/L (ref 101–111)
CO2: 19 mmol/L — ABNORMAL LOW (ref 22–32)
Creatinine, Ser: 1.08 mg/dL (ref 0.61–1.24)
Glucose, Bld: 61 mg/dL — ABNORMAL LOW (ref 65–99)
Potassium: 3.6 mmol/L (ref 3.5–5.1)
SODIUM: 135 mmol/L (ref 135–145)

## 2015-11-27 LAB — URINE CULTURE: Culture: 20000 — AB

## 2015-11-27 LAB — ECHOCARDIOGRAM COMPLETE
Height: 73 in
Weight: 3721.36 oz

## 2015-11-27 LAB — GLUCOSE, CAPILLARY
GLUCOSE-CAPILLARY: 63 mg/dL — AB (ref 65–99)
GLUCOSE-CAPILLARY: 70 mg/dL (ref 65–99)
Glucose-Capillary: 211 mg/dL — ABNORMAL HIGH (ref 65–99)

## 2015-11-27 LAB — CBC
HCT: 42.7 % (ref 39.0–52.0)
HEMOGLOBIN: 14.6 g/dL (ref 13.0–17.0)
MCH: 30.4 pg (ref 26.0–34.0)
MCHC: 34.2 g/dL (ref 30.0–36.0)
MCV: 88.8 fL (ref 78.0–100.0)
PLATELETS: 202 10*3/uL (ref 150–400)
RBC: 4.81 MIL/uL (ref 4.22–5.81)
RDW: 12.9 % (ref 11.5–15.5)
WBC: 8.4 10*3/uL (ref 4.0–10.5)

## 2015-11-27 LAB — HEMOGLOBIN A1C
HEMOGLOBIN A1C: 8.8 % — AB (ref 4.8–5.6)
Mean Plasma Glucose: 206 mg/dL

## 2015-11-27 MED ORDER — RIVAROXABAN 20 MG PO TABS: 20.0000 mg | ORAL_TABLET | Freq: Every day | ORAL | 0 refills | Status: AC

## 2015-11-27 MED ORDER — PERFLUTREN LIPID MICROSPHERE
1.0000 mL | INTRAVENOUS | Status: AC | PRN
  Administered 2015-11-27: 2 mL via INTRAVENOUS
  Filled 2015-11-27: qty 10

## 2015-11-27 MED ORDER — ATORVASTATIN CALCIUM 40 MG PO TABS: 40.0000 mg | ORAL_TABLET | Freq: Every day | ORAL | 0 refills | Status: DC

## 2015-11-27 MED ORDER — INSULIN GLARGINE 100 UNIT/ML ~~LOC~~ SOLN
42.0000 [IU] | Freq: Every day | SUBCUTANEOUS | Status: DC
Start: 2015-11-27 — End: 2015-11-27
  Filled 2015-11-27: qty 0.42

## 2015-11-27 MED ORDER — PERFLUTREN LIPID MICROSPHERE
INTRAVENOUS | Status: AC
  Administered 2015-11-27: 2 mL via INTRAVENOUS
  Filled 2015-11-27: qty 10

## 2015-11-27 MED ORDER — INSULIN GLARGINE 100 UNIT/ML ~~LOC~~ SOLN
42.0000 [IU] | Freq: Every day | SUBCUTANEOUS | 11 refills | Status: DC
Start: 2015-11-27 — End: 2017-10-23

## 2015-11-27 NOTE — Progress Notes (Signed)
Inpatient Diabetes Program Recommendations  AACE/ADA: New Consensus Statement on Inpatient Glycemic Control (2015)  Target Ranges:  Prepandial:   less than 140 mg/dL      Peak postprandial:   less than 180 mg/dL (1-2 hours)      Critically ill patients:  140 - 180 mg/dL  Results for Charles Friedman, Charles Friedman (MRN [id number]) as of 11/27/2015 07:30  Ref. Range 11/26/2015 06:21 11/26/2015 11:42 11/26/2015 16:29 11/26/2015 21:26 11/27/2015 06:34 11/27/2015 06:57  Glucose-Capillary Latest Ref Range: 65 - 99 mg/dL 136 (H) 129 (H) 183 (H) 118 (H) 63 (L) 70    Review of Glycemic Control  Diabetes history: DM2 Outpatient Diabetes medications: Glucovance 2.5-500 mg BID, Lantus 45 units QHS Current orders for Inpatient glycemic control: Lantus 45 units QHS, Novolog 0-9 units TID with meals  Inpatient Diabetes Program Recommendations: Insulin - Basal: Fasting glucose 63 mg/dl this morning. Please consider decreasing Lantus to 42 units QHS.  Thanks, Barnie Alderman, RN, MSN, CDE Diabetes Coordinator Inpatient Diabetes Program [phone] (Team Pager from [place] to [place]) [phone] ([place] office) [phone] [facility] office) [phone] [facility] office)

## 2015-11-27 NOTE — Progress Notes (Signed)
CBG at 0630 was 63. Not symptomatic. Administered 4oz of juice. Rechecked CBG at 0650 was 70. Provided graham crackers.

## 2015-11-27 NOTE — Discharge Summary (Signed)
Physician Discharge Summary  Charles Friedman [id number] DOB: 1932-06-19 DOA: 11/25/2015  PCP: Haywood Pao, MD  Admit date: 11/25/2015 Discharge date: 11/27/2015   Recommendations for Outpatient Follow-Up:   1. xarelto started in place of coumadin 2. Home health ordered- PT/OT/SLP 3. Patient to bring in log of blood sugars- oral meds held here as blood sugars low   Discharge Diagnosis:   Principal Problem:   Cerebral infarction [facility]) Active Problems:   Prostate cancer ([place])   Type 2 diabetes mellitus with hyperglycemia ([place])   Atrial fibrillation ([place])   Stroke (cerebrum) ([place])   Acute CVA (cerebrovascular accident) [facility])   Discharge disposition:  Home with supervision  Discharge Condition: Improved.  Diet recommendation: Low sodium, heart healthy.  Carbohydrate-modified.  Wound care: None.   History of Present Illness:   Charles Friedman is a 82 y.o. male with hx of A-fib which he is on coumadin, and DM type 2 presents to the ED with complaints of slurred speech onset early this evening. Per patients family, they noticed his slurred around 4 pm this evening when he was on the phone with them. They immediately came to his house where they noticed he had left sided facial drooping in addition to the slurred speech. They called the EMS immediately and he was transferred to the ED. CT of the head did not show anything acute. On-call neurologist Dr. Wallie Char was consulted. Patient was not felt to be a candidate for TPA due to minimal deficits. In addition to his blood sugar is found to be elevated more than 100 but not in DKA. He missed his dose of lantus 40 units for his diabetes last night, but he did take a dose of lantus this afternoon. He denies any extremity weakness, vision changes, difficulty swallowing, and headaches. He also denies nausea, vomiting, and CP.    Hospital Course by Problem:   Stroke:  left MCA territory small cortical infarcts embolic secondary to  afib with subtherapeutic INR  Resultant  Right facial droop and dysarthria  MRI  Small L MCA infarcts L precentral gyrus, L frontal operculum, and L parietal white matter  MRA  Possible BA narrowing, o/w no intracranial occlusion/high-grade stenosis  Carotid Doppler  unremarkable 2D Echo Technically difficult; definity used; normal LV function; trace   AI; biatrial enlargement; mild MR and TR; mildly elevated pulmonary pressure.  LDL 95  HgbA1c 8.8-- improve from 9.6  SCDs for VTE prophylaxis  Diet heart healthy/carb modified Room service appropriate? Yes; Fluid consistency: Thin  warfarin daily prior to admission, now on aspirin 325 mg daily and warfarin daily. Neuro discussed with pt and son, they are in agreement to switch to DOACs. They prefer once daily dosing, so Xarelto is recommended at this time  Patient counseled to be compliant with his antithrombotic medications  Ongoing aggressive stroke risk factor management SLP to see patient at home- eval and treat for speech  Atrial Fibrillation  Home anticoagulation:  warfarin daily continued in the hospital  INR 1.17 on admission  Recommend to switch to DOACs. Pt prefer Xarelto.   Continue Xarelto at discharge         Hypertension  Stable              Permissive hypertension (OK if < 180/105) but gradually normalize in 5-7 days              Long-term BP goal normotensive  Hyperlipidemia  Home meds:  pravachol 10, resumed in hospital  LDL 95, goal < 70  Switched to lipitor 40mg   Continue statin at discharge  Diabetes type II  HgbA1c pending, goal < 7.0  Controlled  CBG stable  SSI  On lantus  Other Stroke Risk Factors  Advanced age  Obesity, Body mass index is 30.69 kg/m., recommend weight loss, diet and exercise as appropriate   Other Active Problems  Prostate cancer s/p radiation  Glaucoma     Medical Consultants:    neuro   Discharge Exam:   Vitals:   11/27/15 1022  11/27/15 1335  BP: 125/73 129/70  Pulse: (!) 59 81  Resp: 18 18  Temp: 97.4 F (36.3 C) 97.5 F (36.4 C)   Vitals:   11/27/15 0049 11/27/15 0620 11/27/15 1022 11/27/15 1335  BP: (!) 112/56 (!) 128/58 125/73 129/70  Pulse: 73 71 (!) 59 81  Resp: 18 18 18 18   Temp: 97.4 F (36.3 C) 97.4 F (36.3 C) 97.4 F (36.3 C) 97.5 F (36.4 C)  TempSrc: Oral Oral Oral Oral  SpO2: 96% 99% 99% 95%  Weight:      Height:        Gen:  NAD    The results of significant diagnostics from this hospitalization (including imaging, microbiology, ancillary and laboratory) are listed below for reference.     Procedures and Diagnostic Studies:   Dg Chest 2 View  Result Date: 11/25/2015 CLINICAL DATA:  Stroke. EXAM: CHEST  2 VIEW COMPARISON:  07/15/2014 FINDINGS: The heart size and mediastinal contours are within normal limits. Both lungs are clear. The visualized skeletal structures are unremarkable. IMPRESSION: No active cardiopulmonary disease. Electronically Signed   By: Fidela Salisbury M.D.   On: 11/25/2015 22:57   Ct Head Wo Contrast  Result Date: 11/25/2015 CLINICAL DATA:  Acute onset of slurred speech and right-sided facial droop. Initial encounter. EXAM: CT HEAD WITHOUT CONTRAST TECHNIQUE: Contiguous axial images were obtained from the base of the skull through the vertex without intravenous contrast. COMPARISON:  CT of the head performed 09/26/2013 FINDINGS: Brain: No evidence of acute infarction, hemorrhage, hydrocephalus, extra-axial collection or mass lesion/mass effect. Prominence of ventricles and sulci reflects mild to moderate cortical volume loss. Scattered periventricular and subcortical white matter change likely reflects small vessel ischemic microangiopathy. The brainstem and fourth ventricle are within normal limits. The basal ganglia are unremarkable in appearance. The cerebral hemispheres demonstrate grossly normal gray-white differentiation. No mass effect or midline shift is  seen. Vascular: No hyperdense vessel or unexpected calcification. Skull: There is no evidence of fracture; visualized osseous structures are unremarkable in appearance. Sinuses/Orbits: The orbits are within normal limits. The paranasal sinuses and mastoid air cells are well-aerated. Other: No significant soft tissue abnormalities are seen. IMPRESSION: 1. No acute intracranial pathology seen on CT. 2. Mild to moderate cortical volume loss and scattered small vessel ischemic microangiopathy. Electronically Signed   By: Garald Balding M.D.   On: 11/25/2015 21:05   Mr Brain Wo Contrast  Result Date: 11/26/2015 CLINICAL DATA:  Slurred speech.  Left-sided facial drooping. EXAM: MRI HEAD WITHOUT CONTRAST MRA HEAD WITHOUT CONTRAST TECHNIQUE: Multiplanar, multiecho pulse sequences of the brain and surrounding structures were obtained without intravenous contrast. Angiographic images of the head were obtained using MRA technique without contrast. COMPARISON:  Head CT 11/25/2015 FINDINGS: MRI HEAD FINDINGS Brain: There are multiple foci of diffusion restriction, including along the left precentral gyrus, the superior anterior left parietal white matter and the left frontal operculum. These areas measure up to 8 mm. There  is no evidence of acute hemorrhage. There is beginning confluent hyperintense T2-weighted signal within the periventricular white matter, most often seen in the setting of chronic microvascular ischemia. No mass lesion or midline shift. No hydrocephalus or extra-axial fluid collection. Vascular: Major intracranial arterial and venous sinus flow voids are preserved. No evidence of chronic microhemorrhage or amyloid angiopathy. Skull and upper cervical spine: The visualized skull base, calvarium, upper cervical spine and extracranial soft tissues are normal. Sinuses/Orbits: No fluid levels or advanced mucosal thickening. No mastoid effusion. Normal orbits. MRA HEAD FINDINGS Intracranial internal carotid  arteries: Normal. Anterior cerebral arteries: Normal. Middle cerebral arteries: Normal. Posterior communicating arteries: Present bilaterally. Posterior cerebral arteries: Fetal predominant origin bilaterally. Basilar artery: Short segment loss of normal flow related enhancement within the midportion of the basilar artery, possibly artifactual. Vertebral arteries: Codominant. Normal. Superior cerebellar arteries: Normal. Anterior inferior cerebellar arteries: Not seen, which is not uncommon. Posterior inferior cerebellar arteries: Normal. IMPRESSION: 1. Multifocal small acute ischemic infarcts involving the left precentral gyrus, left frontal operculum and left parietal white matter. These findings are in keeping with right-sided weakness and speech difficulties. 2. No hemorrhage or mass effect. 3. Possible moderate narrowing of the midportion of the basilar artery, though this may be partially due to artifact. Otherwise, no intracranial occlusion or high-grade stenosis. Electronically Signed   By: Ulyses Jarred M.D.   On: 11/26/2015 00:31   Mr Jodene Nam Head/brain [id number] Cm  Result Date: 11/26/2015 CLINICAL DATA:  Slurred speech.  Left-sided facial drooping. EXAM: MRI HEAD WITHOUT CONTRAST MRA HEAD WITHOUT CONTRAST TECHNIQUE: Multiplanar, multiecho pulse sequences of the brain and surrounding structures were obtained without intravenous contrast. Angiographic images of the head were obtained using MRA technique without contrast. COMPARISON:  Head CT 11/25/2015 FINDINGS: MRI HEAD FINDINGS Brain: There are multiple foci of diffusion restriction, including along the left precentral gyrus, the superior anterior left parietal white matter and the left frontal operculum. These areas measure up to 8 mm. There is no evidence of acute hemorrhage. There is beginning confluent hyperintense T2-weighted signal within the periventricular white matter, most often seen in the setting of chronic microvascular ischemia. No mass lesion or  midline shift. No hydrocephalus or extra-axial fluid collection. Vascular: Major intracranial arterial and venous sinus flow voids are preserved. No evidence of chronic microhemorrhage or amyloid angiopathy. Skull and upper cervical spine: The visualized skull base, calvarium, upper cervical spine and extracranial soft tissues are normal. Sinuses/Orbits: No fluid levels or advanced mucosal thickening. No mastoid effusion. Normal orbits. MRA HEAD FINDINGS Intracranial internal carotid arteries: Normal. Anterior cerebral arteries: Normal. Middle cerebral arteries: Normal. Posterior communicating arteries: Present bilaterally. Posterior cerebral arteries: Fetal predominant origin bilaterally. Basilar artery: Short segment loss of normal flow related enhancement within the midportion of the basilar artery, possibly artifactual. Vertebral arteries: Codominant. Normal. Superior cerebellar arteries: Normal. Anterior inferior cerebellar arteries: Not seen, which is not uncommon. Posterior inferior cerebellar arteries: Normal. IMPRESSION: 1. Multifocal small acute ischemic infarcts involving the left precentral gyrus, left frontal operculum and left parietal white matter. These findings are in keeping with right-sided weakness and speech difficulties. 2. No hemorrhage or mass effect. 3. Possible moderate narrowing of the midportion of the basilar artery, though this may be partially due to artifact. Otherwise, no intracranial occlusion or high-grade stenosis. Electronically Signed   By: Ulyses Jarred M.D.   On: 11/26/2015 00:31     Labs:   Basic Metabolic Panel:  Recent Labs Lab 11/25/15 1948 11/25/15 1957 11/26/15 0603 11/27/15 0309  NA 134* 137 137 135  K 4.1 4.1 3.5 3.6  CL 103 103 106 105  CO2 21*  --  24 19*  GLUCOSE 249* 244* 131* 61*  BUN 16 18 16 18   CREATININE 1.23 1.20 1.04 1.08  CALCIUM 8.7*  --  8.3* 8.5*   GFR Estimated Creatinine Clearance: 68.4 mL/min (by C-G formula based on SCr of  1.08 mg/dL). Liver Function Tests:  Recent Labs Lab 11/25/15 1948 11/26/15 0603  AST 19 17  ALT 17 15*  ALKPHOS 103 84  BILITOT 0.7 0.9  PROT 6.4* 5.6*  ALBUMIN 3.4* 3.0*   No results for input(s): LIPASE, AMYLASE in the last 168 hours. No results for input(s): AMMONIA in the last 168 hours. Coagulation profile  Recent Labs Lab 11/25/15 1948 11/26/15 0603  INR 1.17 1.18    CBC:  Recent Labs Lab 11/25/15 1948 11/25/15 1957 11/26/15 0603 11/27/15 0309  WBC 8.4  --  6.8 8.4  NEUTROABS 5.7  --   --   --   HGB 15.5 15.6 14.2 14.6  HCT 44.1 46.0 41.3 42.7  MCV 88.4  --  88.6 88.8  PLT 224  --  195 202   Cardiac Enzymes:  Recent Labs Lab 11/25/15 2320 11/26/15 0603 11/26/15 1105  TROPONINI 0.40* 0.33* 0.28*   BNP: Invalid input(s): POCBNP CBG:  Recent Labs Lab 11/26/15 1629 11/26/15 2126 11/27/15 0634 11/27/15 0657 11/27/15 1112  GLUCAP 183* 118* 63* 70 211*   D-Dimer No results for input(s): DDIMER in the last 72 hours. Hgb A1c  Recent Labs  11/26/15 0603  HGBA1C 8.8*   Lipid Profile  Recent Labs  11/26/15 0603  CHOL 146  HDL 30*  LDLCALC 95  TRIG 105  CHOLHDL 4.9   Thyroid function studies No results for input(s): TSH, T4TOTAL, T3FREE, THYROIDAB in the last 72 hours.  Invalid input(s): FREET3 Anemia work up No results for input(s): VITAMINB12, FOLATE, FERRITIN, TIBC, IRON, RETICCTPCT in the last 72 hours. Microbiology Recent Results (from the past 240 hour(s))  Urine culture     Status: Abnormal   Collection Time: 11/25/15  7:36 PM  Result Value Ref Range Status   Specimen Description URINE, RANDOM  Final   Special Requests NONE  Final   Culture (A)  Final    20,000 COLONIES/mL GROUP B STREP(S.AGALACTIAE)ISOLATED TESTING AGAINST S. AGALACTIAE NOT ROUTINELY PERFORMED DUE TO PREDICTABILITY OF AMP/PEN/VAN SUSCEPTIBILITY.    Report Status 11/27/2015 FINAL  Final     Discharge Instructions:   Discharge Instructions     Ambulatory referral to Neurology    Complete by:  As directed    Follow up with NP Cecille Rubin at [facility] in about 6 weeks. Thanks.   Diet - low sodium heart healthy    Complete by:  As directed    Diet Carb Modified    Complete by:  As directed    Discharge instructions    Complete by:  As directed    Home health No driving until seen by neurology   Increase activity slowly    Complete by:  As directed        Medication List    STOP taking these medications   glyBURIDE-metformin 2.5-500 MG tablet Commonly known as:  GLUCOVANCE   pravastatin 10 MG tablet Commonly known as:  PRAVACHOL   warfarin 5 MG tablet Commonly known as:  COUMADIN     TAKE these medications   atorvastatin 40 MG tablet Commonly known as:  LIPITOR Take  1 tablet (40 mg total) by mouth daily at 6 PM.   insulin glargine 100 UNIT/ML injection Commonly known as:  LANTUS Inject 0.42 mLs (42 Units total) into the skin at bedtime. What changed:  how much to take   latanoprost 0.005 % ophthalmic solution Commonly known as:  XALATAN Place 1 drop into both eyes at bedtime.   metoprolol tartrate 25 MG tablet Commonly known as:  LOPRESSOR Take 0.5 tablets (12.5 mg total) by mouth 2 (two) times daily.   rivaroxaban 20 MG Tabs tablet Commonly known as:  XARELTO Take 1 tablet (20 mg total) by mouth daily with supper.   tamsulosin 0.4 MG Caps capsule Commonly known as:  FLOMAX Take 0.4 mg by mouth daily.      Follow-up Information    Dennie Bible, NP. Schedule an appointment as soon as possible for a visit in 6 week(s).   Specialty:  Family Medicine Contact information: [address] [place] [address] [phone]            Time coordinating discharge: 35 min  Signed:  Katianna Mcclenney Alison Stalling   [facility] 11/27/2015, 2:10 PM

## 2015-11-27 NOTE — Care Management Note (Signed)
Case Management Note  Patient Details  Name: Charles Friedman MRN: [id number] Date of Birth: 10/16/1932  Subjective/Objective:                    Action/Plan: Patient discharging home with orders for [facility] services. CM met with the patient and provided him a list of [place] agencies in the [place] area. He selected [facility]. Karolee Stamps with [facility] notified and accepted the referral. Pt states his son and grandson are able to provide intermittent supervision and will provide transportation home. Bedside RN updated.   Expected Discharge Date:  11/27/15               Expected Discharge Plan:  [place]  In-House Referral:     Discharge planning Services  CM Consult  Post Acute Care Choice:  Home Health Choice offered to:  Patient  DME Arranged:    DME Agency:     HH Arranged:  RN, PT, OT, Nurse's Aide, Speech Therapy, Social Work [place] Agency:  [place]  Status of Service:  Completed, signed off  If discussed at [employer] of Stay Meetings, dates discussed:    Additional Comments:  Pollie Friar, RN 11/27/2015, 2:43 PM

## 2015-11-27 NOTE — Progress Notes (Signed)
  Echocardiogram 2D Echocardiogram with definity has been performed.  Darlina Sicilian M 11/27/2015, 10:36 AM

## 2015-11-28 DIAGNOSIS — E119 Type 2 diabetes mellitus without complications: Secondary | ICD-10-CM | POA: Diagnosis not present

## 2015-11-28 DIAGNOSIS — R531 Weakness: Secondary | ICD-10-CM | POA: Diagnosis not present

## 2015-11-28 DIAGNOSIS — I69392 Facial weakness following cerebral infarction: Secondary | ICD-10-CM | POA: Diagnosis not present

## 2015-11-28 DIAGNOSIS — Z8546 Personal history of malignant neoplasm of prostate: Secondary | ICD-10-CM | POA: Diagnosis not present

## 2015-11-28 DIAGNOSIS — E784 Other hyperlipidemia: Secondary | ICD-10-CM | POA: Diagnosis not present

## 2015-11-28 DIAGNOSIS — I10 Essential (primary) hypertension: Secondary | ICD-10-CM | POA: Diagnosis not present

## 2015-11-28 DIAGNOSIS — I69322 Dysarthria following cerebral infarction: Secondary | ICD-10-CM | POA: Diagnosis not present

## 2015-11-28 DIAGNOSIS — I4891 Unspecified atrial fibrillation: Secondary | ICD-10-CM | POA: Diagnosis not present

## 2015-11-28 DIAGNOSIS — Z794 Long term (current) use of insulin: Secondary | ICD-10-CM | POA: Diagnosis not present

## 2015-11-29 DIAGNOSIS — I69322 Dysarthria following cerebral infarction: Secondary | ICD-10-CM | POA: Diagnosis not present

## 2015-11-29 DIAGNOSIS — I69392 Facial weakness following cerebral infarction: Secondary | ICD-10-CM | POA: Diagnosis not present

## 2015-11-30 DIAGNOSIS — I69322 Dysarthria following cerebral infarction: Secondary | ICD-10-CM | POA: Diagnosis not present

## 2015-11-30 DIAGNOSIS — I69392 Facial weakness following cerebral infarction: Secondary | ICD-10-CM | POA: Diagnosis not present

## 2015-12-04 DIAGNOSIS — I69392 Facial weakness following cerebral infarction: Secondary | ICD-10-CM | POA: Diagnosis not present

## 2015-12-04 DIAGNOSIS — I69322 Dysarthria following cerebral infarction: Secondary | ICD-10-CM | POA: Diagnosis not present

## 2015-12-05 DIAGNOSIS — I69322 Dysarthria following cerebral infarction: Secondary | ICD-10-CM | POA: Diagnosis not present

## 2015-12-05 DIAGNOSIS — I69392 Facial weakness following cerebral infarction: Secondary | ICD-10-CM | POA: Diagnosis not present

## 2015-12-07 DIAGNOSIS — I69322 Dysarthria following cerebral infarction: Secondary | ICD-10-CM | POA: Diagnosis not present

## 2015-12-07 DIAGNOSIS — I69392 Facial weakness following cerebral infarction: Secondary | ICD-10-CM | POA: Diagnosis not present

## 2015-12-10 DIAGNOSIS — I69322 Dysarthria following cerebral infarction: Secondary | ICD-10-CM | POA: Diagnosis not present

## 2015-12-10 DIAGNOSIS — I10 Essential (primary) hypertension: Secondary | ICD-10-CM | POA: Diagnosis not present

## 2015-12-10 DIAGNOSIS — I69392 Facial weakness following cerebral infarction: Secondary | ICD-10-CM | POA: Diagnosis not present

## 2015-12-11 DIAGNOSIS — I69392 Facial weakness following cerebral infarction: Secondary | ICD-10-CM | POA: Diagnosis not present

## 2015-12-11 DIAGNOSIS — I69322 Dysarthria following cerebral infarction: Secondary | ICD-10-CM | POA: Diagnosis not present

## 2015-12-13 DIAGNOSIS — I69322 Dysarthria following cerebral infarction: Secondary | ICD-10-CM | POA: Diagnosis not present

## 2015-12-13 DIAGNOSIS — I69392 Facial weakness following cerebral infarction: Secondary | ICD-10-CM | POA: Diagnosis not present

## 2015-12-17 ENCOUNTER — Encounter: Payer: Self-pay | Admitting: Neurology

## 2015-12-17 DIAGNOSIS — I69392 Facial weakness following cerebral infarction: Secondary | ICD-10-CM | POA: Diagnosis not present

## 2015-12-17 DIAGNOSIS — I69322 Dysarthria following cerebral infarction: Secondary | ICD-10-CM | POA: Diagnosis not present

## 2015-12-17 NOTE — Progress Notes (Signed)
Received  Lab works from PCP:  12/10/15 - LDL 59, total CHol 104, HDL 33, TG 60, ASA and ALT normal.   Rosalin Hawking, MD PhD Stroke Neurology 12/17/2015 1:36 PM

## 2015-12-18 DIAGNOSIS — I69322 Dysarthria following cerebral infarction: Secondary | ICD-10-CM | POA: Diagnosis not present

## 2015-12-18 DIAGNOSIS — I69392 Facial weakness following cerebral infarction: Secondary | ICD-10-CM | POA: Diagnosis not present

## 2015-12-20 DIAGNOSIS — I69322 Dysarthria following cerebral infarction: Secondary | ICD-10-CM | POA: Diagnosis not present

## 2015-12-20 DIAGNOSIS — I69392 Facial weakness following cerebral infarction: Secondary | ICD-10-CM | POA: Diagnosis not present

## 2015-12-24 DIAGNOSIS — I69322 Dysarthria following cerebral infarction: Secondary | ICD-10-CM | POA: Diagnosis not present

## 2015-12-24 DIAGNOSIS — N5201 Erectile dysfunction due to arterial insufficiency: Secondary | ICD-10-CM | POA: Diagnosis not present

## 2015-12-24 DIAGNOSIS — R3915 Urgency of urination: Secondary | ICD-10-CM | POA: Diagnosis not present

## 2015-12-24 DIAGNOSIS — I69392 Facial weakness following cerebral infarction: Secondary | ICD-10-CM | POA: Diagnosis not present

## 2015-12-24 DIAGNOSIS — C61 Malignant neoplasm of prostate: Secondary | ICD-10-CM | POA: Diagnosis not present

## 2015-12-26 DIAGNOSIS — E669 Obesity, unspecified: Secondary | ICD-10-CM | POA: Diagnosis not present

## 2015-12-26 DIAGNOSIS — E1129 Type 2 diabetes mellitus with other diabetic kidney complication: Secondary | ICD-10-CM | POA: Diagnosis not present

## 2015-12-26 DIAGNOSIS — I639 Cerebral infarction, unspecified: Secondary | ICD-10-CM | POA: Diagnosis not present

## 2015-12-26 DIAGNOSIS — E7800 Pure hypercholesterolemia, unspecified: Secondary | ICD-10-CM | POA: Diagnosis not present

## 2015-12-26 DIAGNOSIS — N182 Chronic kidney disease, stage 2 (mild): Secondary | ICD-10-CM | POA: Diagnosis not present

## 2015-12-26 DIAGNOSIS — I480 Paroxysmal atrial fibrillation: Secondary | ICD-10-CM | POA: Diagnosis not present

## 2015-12-26 DIAGNOSIS — Z9114 Patient's other noncompliance with medication regimen: Secondary | ICD-10-CM | POA: Diagnosis not present

## 2015-12-26 DIAGNOSIS — I129 Hypertensive chronic kidney disease with stage 1 through stage 4 chronic kidney disease, or unspecified chronic kidney disease: Secondary | ICD-10-CM | POA: Diagnosis not present

## 2015-12-26 DIAGNOSIS — I10 Essential (primary) hypertension: Secondary | ICD-10-CM | POA: Diagnosis not present

## 2015-12-26 DIAGNOSIS — Z7901 Long term (current) use of anticoagulants: Secondary | ICD-10-CM | POA: Diagnosis not present

## 2015-12-26 DIAGNOSIS — Z6831 Body mass index (BMI) 31.0-31.9, adult: Secondary | ICD-10-CM | POA: Diagnosis not present

## 2016-01-17 ENCOUNTER — Ambulatory Visit: Payer: Self-pay | Admitting: Nurse Practitioner

## 2016-01-22 ENCOUNTER — Ambulatory Visit (INDEPENDENT_AMBULATORY_CARE_PROVIDER_SITE_OTHER): Payer: Medicare Other | Admitting: Nurse Practitioner

## 2016-01-22 ENCOUNTER — Encounter: Payer: Self-pay | Admitting: Nurse Practitioner

## 2016-01-22 VITALS — BP 136/60 | HR 101 | Ht 73.0 in | Wt 236.0 lb

## 2016-01-22 DIAGNOSIS — I639 Cerebral infarction, unspecified: Secondary | ICD-10-CM | POA: Diagnosis not present

## 2016-01-22 DIAGNOSIS — I482 Chronic atrial fibrillation, unspecified: Secondary | ICD-10-CM

## 2016-01-22 DIAGNOSIS — R269 Unspecified abnormalities of gait and mobility: Secondary | ICD-10-CM | POA: Diagnosis not present

## 2016-01-22 DIAGNOSIS — E785 Hyperlipidemia, unspecified: Secondary | ICD-10-CM | POA: Diagnosis not present

## 2016-01-22 NOTE — Patient Instructions (Addendum)
Stressed the importance of management of risk factors to prevent further stroke Continue Xarelto for secondary stroke prevention and atrial fibrillation Maintain strict control of hypertension with blood pressure goal below 130/90, today's reading 136/60 continue antihypertensive medications Control of diabetes with hemoglobin A1c below 6.5 followed by primary care most recent hemoglobin A1c8.8  continue diabetic medications Cholesterol with LDL cholesterol less than 70, followed by primary care,  most recent 95 continue Lipitor Exercise by walking, slowly increase , eat healthy diet with whole grains,  fresh fruits and vegetables Discussed risk for recurrent stroke/ TIA and answered additional questions Follow-up in 3 months

## 2016-01-22 NOTE — Progress Notes (Signed)
[facility]  PATIENT: Charles Friedman DOB: 10-09-1932   REASON FOR VISIT: Hospital follow-up for stroke HISTORY FROM: Patient    HISTORY OF PRESENT ILLNESS:Emric N Coxis an 82 y.o.malehistory of atrial fibrillation on Coumadin, hyperlipidemia and diabetes mellitus presenting with new onset slurred speech and right facial droop. He was last known well at 2 PM today 11/25/2015 ([facility]). There is no previous history of stroke or TIA. INR is pending. CT scan of the head is also pending. Deficits have improved since onset but are still present. NIH stroke score was 2. There was no weakness of right extremities. Patient was not administered IV t-PA secondary to minimal deficits and beyond time window for treatment consideration. He was admitted for further evaluation and treatment.On coumadin at home, but INR fluctuate over time. His last check was in middle of September and it was 3.1. However, on admission it was 1.17 and this morning still at 1.18. I talked with son and pt and they are Ok to switch to DOACs and they prefer once a day medication. MRI of the brain small left MCA infarcts, left precentral gyrus, left frontal operculum, and left parietal white matter. MRA without high-grade stenosis. Carotid Doppler unremarkable. 2-D echo with normal LV function. LDL 95. Hemoglobin A1c 8.8. Patient was placed on Xarelto and Coumadin was discontinued. Pravachol was also discontinued and he was switched to Lipitor. He remains on his Lantus insulin. He returns to the stroke clinic today for follow-up. He has not had further stroke or TIA symptoms. He remains on Xarelto with minimal bruising and no bleeding. He denies myalgias on Lipitor. Blood pressure in the office today 136/60. He continues to live alone but his son lives close by. He continues to ambulate with a walker and his therapies have concluded. He has a neuropathy from his diabetes. He returns for reevaluation    REVIEW OF SYSTEMS:  Full 14 system review of systems performed and notable only for those listed, all others are neg:  Constitutional: Fatigue Cardiovascular: neg Ear/Nose/Throat: neg  Skin: neg Eyes: neg Respiratory: neg Gastroitestinal: neg  Hematology/Lymphatic: neg  Endocrine: neg Musculoskeletal:neg Allergy/Immunology: neg Neurological: neg Psychiatric: neg Sleep : neg   ALLERGIES: Allergies  Allergen Reactions  . Aureomycin [Chlortetracycline] Itching and Rash    Reaction approximately 1956    HOME MEDICATIONS: Outpatient Medications Prior to Visit  Medication Sig Dispense Refill  . atorvastatin (LIPITOR) 40 MG tablet Take 1 tablet (40 mg total) by mouth daily at 6 PM. 30 tablet 0  . insulin glargine (LANTUS) 100 UNIT/ML injection Inject 0.42 mLs (42 Units total) into the skin at bedtime. 10 mL 11  . latanoprost (XALATAN) 0.005 % ophthalmic solution Place 1 drop into both eyes at bedtime.     . metoprolol tartrate (LOPRESSOR) 25 MG tablet Take 0.5 tablets (12.5 mg total) by mouth 2 (two) times daily. 30 tablet 0  . rivaroxaban (XARELTO) 20 MG TABS tablet Take 1 tablet (20 mg total) by mouth daily with supper. 30 tablet 0  . tamsulosin (FLOMAX) 0.4 MG CAPS capsule Take 0.4 mg by mouth daily.     No facility-administered medications prior to visit.     PAST MEDICAL HISTORY: Past Medical History:  Diagnosis Date  . Atrial fibrillation ([place])   . Cancer [facility])    prostate cancer  . Cataract    no surgery yet  . Diabetes mellitus without complication ([place])    type II  . Glaucoma    left eye  .  Hx of radiation therapy 08/15/13- 10/06/13   prostate 7600 cGy 38 sessions  . Neuropathy (HCC)    diabetic, in feet  . Personal history of fall 09/26/13   front entrance of [place]    PAST SURGICAL HISTORY: Past Surgical History:  Procedure Laterality Date  . PROSTATE BIOPSY  12/25/11   Adenocarcinoma, gleason 6    FAMILY HISTORY: Family History  Problem Relation Age of Onset  . Cancer  Sister 77    brain tumor died at age 67    SOCIAL HISTORY: Social History   Social History  . Marital status: Widowed    Spouse name: N/A  . Number of children: 4  . Years of education: N/A   Occupational History  . Retired      [employer]   Social History Main Topics  . Smoking status: Never Smoker  . Smokeless tobacco: Never Used  . Alcohol use No  . Drug use: No  . Sexual activity: Not on file   Other Topics Concern  . Not on file   Social History Narrative   Pt lives alone.  Has 2 sons, one in [place], [place] and the other in [place].       PHYSICAL EXAM  Vitals:   01/22/16 1505  BP:  136/60  Pulse: (!) 101  Weight: 236 lb (107 kg)  Height: 6\' 1"  (1.854 m)   Body mass index is 31.14 kg/m.  Generalized: Well developed, Obese male in no acute distress  Head: normocephalic and atraumatic,. Oropharynx benign  Neck: Supple, no carotid bruits  Cardiac: Irregularly irregular  rate rhythm, no murmur  Musculoskeletal: No deformity   Neurological examination   Mentation: Alert oriented to time, place, history taking. Attention span and concentration appropriate. Recent and remote memory intact.  Follows all commands speech and language fluent.   Cranial nerve II-XII: Fundoscopic exam not done.Pupils were equal round reactive to light extraocular movements were full, visual field were full on confrontational test. Facial sensation and strength were normal. hearing was intact to finger rubbing bilaterally. Uvula tongue midline. head turning and shoulder shrug were normal and symmetric.Tongue protrusion into cheek strength was normal. Motor: normal bulk and tone, full strength in the BUE, BLE, fine finger movements normal, no pronator drift. No focal weakness Sensory: normal and symmetric to light touch, pinprick, and  Vibration, proprioception  Coordination: finger-nose-finger, heel-to-shin bilaterally, no dysmetria Reflexes: 1+ upper lower and symmetric, plantar  responses were flexor bilaterally. Gait and Station: Rising up from seated position without assistance, wide based stance,  ambulates with a walker no difficulty with turns  DIAGNOSTIC DATA (LABS, IMAGING, TESTING) - I reviewed patient records, labs, notes, testing and imaging myself where available.  Lab Results  Component Value Date   WBC 8.4 11/27/2015   HGB 14.6 11/27/2015   HCT 42.7 11/27/2015   MCV 88.8 11/27/2015   PLT 202 11/27/2015      Component Value Date/Time   NA 135 11/27/2015 0309   K 3.6 11/27/2015 0309   CL 105 11/27/2015 0309   CO2 19 (L) 11/27/2015 0309   GLUCOSE 61 (L) 11/27/2015 0309   BUN 18 11/27/2015 0309   CREATININE 1.08 11/27/2015 0309   CALCIUM 8.5 (L) 11/27/2015 0309   PROT 5.6 (L) 11/26/2015 0603   ALBUMIN 3.0 (L) 11/26/2015 0603   AST 17 11/26/2015 0603   ALT 15 (L) 11/26/2015 0603   ALKPHOS 84 11/26/2015 0603   BILITOT 0.9 11/26/2015 0603   GFRNONAA >60 11/27/2015  0309   GFRAA >60 11/27/2015 0309   Lab Results  Component Value Date   CHOL 146 11/26/2015   HDL 30 (L) 11/26/2015   LDLCALC 95 11/26/2015   TRIG 105 11/26/2015   CHOLHDL 4.9 11/26/2015   Lab Results  Component Value Date   HGBA1C 8.8 (H) 11/26/2015    ASSESSMENT AND PLAN  82 y.o. year old male  has a past medical history of Atrial fibrillation ([place]); Cancer ([place]); Cataract; Diabetes mellitus without complication ([place]); Glaucoma; radiation therapy (08/15/13- 10/06/13); Neuropathy ([place]); and Personal history of fall (09/26/13). hereTo follow-up for stroke on 11/25/2015.MRI of the brain small left MCA infarcts, left precentral gyrus, left frontal operculum, and left parietal white matter.The patient is a current patient of Dr. Erlinda Hong  who is out of the office today . This note is sent to the work in doctor.     Stressed the importance of management of risk factors to prevent further stroke Continue Xarelto for secondary stroke prevention and atrial fibrillation Maintain strict  control of hypertension with blood pressure goal below 130/90, today's reading 136/60 continue antihypertensive medications Control of diabetes with hemoglobin A1c below 6.5 followed by primary care most recent hemoglobin A1c8.8  continue diabetic medications Cholesterol with LDL cholesterol less than 70, followed by primary care,  most recent 95 continue Lipitor Exercise by walking, slowly increase , eat healthy diet with whole grains,  fresh fruits and vegetables Discussed risk for recurrent stroke/ TIA and answered additional questions Follow-up in 3 months This was a  visit requiring 30 minutes with  extensive review of history, hospital chart, counseling and answering questions Dennie Bible, [facility], [facility], APRN  [facility] [address] [place], [place] [address] [phone]

## 2016-01-23 NOTE — Progress Notes (Signed)
I have read the note, and I agree with the clinical assessment and plan.  Richard A. Sater, MD, PhD Certified in Neurology, Clinical Neurophysiology, Sleep Medicine, Pain Medicine and Neuroimaging  [facility] [address], [address] [place], [place] [address] [phone]  

## 2016-02-20 DIAGNOSIS — R310 Gross hematuria: Secondary | ICD-10-CM | POA: Diagnosis not present

## 2016-02-22 DIAGNOSIS — C61 Malignant neoplasm of prostate: Secondary | ICD-10-CM | POA: Diagnosis not present

## 2016-02-22 DIAGNOSIS — R310 Gross hematuria: Secondary | ICD-10-CM | POA: Diagnosis not present

## 2016-02-28 DIAGNOSIS — N182 Chronic kidney disease, stage 2 (mild): Secondary | ICD-10-CM | POA: Diagnosis not present

## 2016-02-28 DIAGNOSIS — I638 Other cerebral infarction: Secondary | ICD-10-CM | POA: Diagnosis not present

## 2016-02-28 DIAGNOSIS — C61 Malignant neoplasm of prostate: Secondary | ICD-10-CM | POA: Diagnosis not present

## 2016-02-28 DIAGNOSIS — I129 Hypertensive chronic kidney disease with stage 1 through stage 4 chronic kidney disease, or unspecified chronic kidney disease: Secondary | ICD-10-CM | POA: Diagnosis not present

## 2016-02-28 DIAGNOSIS — R808 Other proteinuria: Secondary | ICD-10-CM | POA: Diagnosis not present

## 2016-02-28 DIAGNOSIS — Z9114 Patient's other noncompliance with medication regimen: Secondary | ICD-10-CM | POA: Diagnosis not present

## 2016-02-28 DIAGNOSIS — E1129 Type 2 diabetes mellitus with other diabetic kidney complication: Secondary | ICD-10-CM | POA: Diagnosis not present

## 2016-02-28 DIAGNOSIS — I480 Paroxysmal atrial fibrillation: Secondary | ICD-10-CM | POA: Diagnosis not present

## 2016-02-28 DIAGNOSIS — E668 Other obesity: Secondary | ICD-10-CM | POA: Diagnosis not present

## 2016-02-28 DIAGNOSIS — E1140 Type 2 diabetes mellitus with diabetic neuropathy, unspecified: Secondary | ICD-10-CM | POA: Diagnosis not present

## 2016-02-28 DIAGNOSIS — Z7901 Long term (current) use of anticoagulants: Secondary | ICD-10-CM | POA: Diagnosis not present

## 2016-02-28 DIAGNOSIS — Z6830 Body mass index (BMI) 30.0-30.9, adult: Secondary | ICD-10-CM | POA: Diagnosis not present

## 2016-03-11 DIAGNOSIS — C61 Malignant neoplasm of prostate: Secondary | ICD-10-CM | POA: Diagnosis not present

## 2016-03-11 DIAGNOSIS — K862 Cyst of pancreas: Secondary | ICD-10-CM | POA: Diagnosis not present

## 2016-03-11 DIAGNOSIS — R3915 Urgency of urination: Secondary | ICD-10-CM | POA: Diagnosis not present

## 2016-03-11 DIAGNOSIS — R310 Gross hematuria: Secondary | ICD-10-CM | POA: Diagnosis not present

## 2016-05-07 ENCOUNTER — Ambulatory Visit: Payer: Self-pay | Admitting: Nurse Practitioner

## 2016-11-18 ENCOUNTER — Other Ambulatory Visit: Payer: Self-pay | Admitting: Urology

## 2016-11-18 DIAGNOSIS — C61 Malignant neoplasm of prostate: Secondary | ICD-10-CM | POA: Diagnosis not present

## 2016-11-18 DIAGNOSIS — R3915 Urgency of urination: Secondary | ICD-10-CM | POA: Diagnosis not present

## 2016-11-18 DIAGNOSIS — K862 Cyst of pancreas: Secondary | ICD-10-CM

## 2016-11-25 DIAGNOSIS — C61 Malignant neoplasm of prostate: Secondary | ICD-10-CM | POA: Diagnosis not present

## 2016-12-02 ENCOUNTER — Other Ambulatory Visit: Payer: Self-pay | Admitting: Urology

## 2016-12-02 DIAGNOSIS — K862 Cyst of pancreas: Secondary | ICD-10-CM

## 2016-12-04 ENCOUNTER — Ambulatory Visit (HOSPITAL_COMMUNITY)
Admission: RE | Admit: 2016-12-04 | Discharge: 2016-12-04 | Disposition: A | Discharge status: Home / Self Care | Payer: Medicare Other | Source: Ambulatory Visit | Attending: Urology | Admitting: Urology

## 2016-12-04 DIAGNOSIS — R9389 Abnormal findings on diagnostic imaging of other specified body structures: Secondary | ICD-10-CM | POA: Diagnosis not present

## 2016-12-04 DIAGNOSIS — K862 Cyst of pancreas: Secondary | ICD-10-CM | POA: Insufficient documentation

## 2016-12-04 DIAGNOSIS — I700 Atherosclerosis of aorta: Secondary | ICD-10-CM | POA: Diagnosis not present

## 2016-12-04 DIAGNOSIS — N281 Cyst of kidney, acquired: Secondary | ICD-10-CM | POA: Insufficient documentation

## 2016-12-04 DIAGNOSIS — K449 Diaphragmatic hernia without obstruction or gangrene: Secondary | ICD-10-CM | POA: Diagnosis not present

## 2016-12-04 LAB — POCT I-STAT CREATININE: Creatinine, Ser: 0.9 mg/dL (ref 0.61–1.24)

## 2016-12-04 MED ORDER — GADOBENATE DIMEGLUMINE 529 MG/ML IV SOLN
20.0000 mL | Freq: Once | INTRAVENOUS | Status: AC | PRN
  Administered 2016-12-04: 20 mL via INTRAVENOUS

## 2016-12-15 DIAGNOSIS — R3915 Urgency of urination: Secondary | ICD-10-CM | POA: Diagnosis not present

## 2016-12-15 DIAGNOSIS — R310 Gross hematuria: Secondary | ICD-10-CM | POA: Diagnosis not present

## 2016-12-15 DIAGNOSIS — N5201 Erectile dysfunction due to arterial insufficiency: Secondary | ICD-10-CM | POA: Diagnosis not present

## 2016-12-15 DIAGNOSIS — C61 Malignant neoplasm of prostate: Secondary | ICD-10-CM | POA: Diagnosis not present

## 2016-12-23 DIAGNOSIS — I129 Hypertensive chronic kidney disease with stage 1 through stage 4 chronic kidney disease, or unspecified chronic kidney disease: Secondary | ICD-10-CM | POA: Diagnosis not present

## 2016-12-23 DIAGNOSIS — Z7901 Long term (current) use of anticoagulants: Secondary | ICD-10-CM | POA: Diagnosis not present

## 2016-12-23 DIAGNOSIS — Z9114 Patient's other noncompliance with medication regimen: Secondary | ICD-10-CM | POA: Diagnosis not present

## 2016-12-23 DIAGNOSIS — I480 Paroxysmal atrial fibrillation: Secondary | ICD-10-CM | POA: Diagnosis not present

## 2016-12-23 DIAGNOSIS — E1140 Type 2 diabetes mellitus with diabetic neuropathy, unspecified: Secondary | ICD-10-CM | POA: Diagnosis not present

## 2016-12-23 DIAGNOSIS — C61 Malignant neoplasm of prostate: Secondary | ICD-10-CM | POA: Diagnosis not present

## 2016-12-23 DIAGNOSIS — Z23 Encounter for immunization: Secondary | ICD-10-CM | POA: Diagnosis not present

## 2016-12-23 DIAGNOSIS — E7800 Pure hypercholesterolemia, unspecified: Secondary | ICD-10-CM | POA: Diagnosis not present

## 2016-12-23 DIAGNOSIS — N182 Chronic kidney disease, stage 2 (mild): Secondary | ICD-10-CM | POA: Diagnosis not present

## 2016-12-23 DIAGNOSIS — E1129 Type 2 diabetes mellitus with other diabetic kidney complication: Secondary | ICD-10-CM | POA: Diagnosis not present

## 2016-12-23 DIAGNOSIS — R808 Other proteinuria: Secondary | ICD-10-CM | POA: Diagnosis not present

## 2016-12-23 DIAGNOSIS — Z6828 Body mass index (BMI) 28.0-28.9, adult: Secondary | ICD-10-CM | POA: Diagnosis not present

## 2016-12-23 DIAGNOSIS — I6389 Other cerebral infarction: Secondary | ICD-10-CM | POA: Diagnosis not present

## 2016-12-24 NOTE — Telephone Encounter (Signed)
12/24/2016 Received faxed referral from [facility] for upcoming appointment with Dr. Ellyn Hack on 01/14/2017.  Records giving to [facility]. cbr

## 2017-01-12 DIAGNOSIS — E113211 Type 2 diabetes mellitus with mild nonproliferative diabetic retinopathy with macular edema, right eye: Secondary | ICD-10-CM | POA: Diagnosis not present

## 2017-01-14 ENCOUNTER — Encounter: Payer: Self-pay | Admitting: Cardiology

## 2017-01-14 ENCOUNTER — Telehealth: Payer: Self-pay | Admitting: Cardiology

## 2017-01-14 ENCOUNTER — Ambulatory Visit: Payer: Medicare Other | Admitting: Cardiology

## 2017-01-14 ENCOUNTER — Ambulatory Visit (INDEPENDENT_AMBULATORY_CARE_PROVIDER_SITE_OTHER): Payer: Medicare Other | Admitting: Cardiology

## 2017-01-14 VITALS — BP 132/78 | HR 105 | Ht 73.0 in | Wt 218.0 lb

## 2017-01-14 DIAGNOSIS — I4819 Other persistent atrial fibrillation: Secondary | ICD-10-CM

## 2017-01-14 DIAGNOSIS — Z8673 Personal history of transient ischemic attack (TIA), and cerebral infarction without residual deficits: Secondary | ICD-10-CM

## 2017-01-14 DIAGNOSIS — I481 Persistent atrial fibrillation: Secondary | ICD-10-CM | POA: Diagnosis not present

## 2017-01-14 DIAGNOSIS — I208 Other forms of angina pectoris: Secondary | ICD-10-CM | POA: Diagnosis not present

## 2017-01-14 DIAGNOSIS — I209 Angina pectoris, unspecified: Secondary | ICD-10-CM

## 2017-01-14 DIAGNOSIS — E782 Mixed hyperlipidemia: Secondary | ICD-10-CM | POA: Diagnosis not present

## 2017-01-14 MED ORDER — METOPROLOL TARTRATE 25 MG PO TABS: 25.0000 mg | ORAL_TABLET | Freq: Two times a day (BID) | ORAL | 11 refills | Status: DC

## 2017-01-14 MED ORDER — METOPROLOL TARTRATE 25 MG PO TABS: ORAL_TABLET | ORAL | 11 refills | Status: DC

## 2017-01-14 MED ORDER — NITROGLYCERIN 0.4 MG SL SUBL: 0.4000 mg | SUBLINGUAL_TABLET | SUBLINGUAL | 3 refills | Status: AC | PRN

## 2017-01-14 NOTE — Progress Notes (Signed)
PCP: Charles Pao, MD  Clinic Note: Chief Complaint  Patient presents with  . New Patient (Initial Visit)    Atrial fibrillation    HPI: Charles Friedman is a 83 y.o. male who is being seen today for the evaluation of history of atrial fibrillation and stroke at the request of Tisovec, Fransico Him, MD. He has a history of known chronic atrial fibrillation -initially diagnosed roughly 10 years ago --initially started on Coumadin when he was followed by Dr. Pauline Friedman.  Unfortunately with Dr. Pauline Friedman retired, [name] never followed up with another cardiologist.  He is been followed by his PCP for but roughly 10 years now.  He used to know when he was in A. fib, but now he cannot tell unless he is exerting himself rapidly. Back in October 2017 he was noted to have acute CVA.  He was switched from Coumadin to Xarelto.  He was also converted from Pravachol to Lipitor.  In addition to A. fib he has diabetes mellitus, hypertension and hyperlipidemia.  Charles Friedman was last seen by his PCP on November 6.  He had missed a couple appointments prior to this and his blood sugars are well out of control.  A1c was up to 11.4.  He had not been using Humalog and had missing some doses of Lantus.  At that time, Dr. Osborne Friedman realized it he had not followed up with cardiology since his stroke the previous year and referred Friedman for cardiology evaluation.  Recent Hospitalizations: None  Studies Personally Reviewed - (if available, images/films reviewed: From Epic Chart or Care Everywhere)  2D echo October 2017: Technically difficult.  Normal LV size and function.  EF 55-60%.  No regional wall motion normality.  Moderate LA dilation, mild RA dilation.  Mildly increased PA pressures.  Carotid Dopplers October 2017: Mild plaque bilaterally.  Less than 40%.  Antegrade vertebral flow.  Interval History: Charles Friedman presents to reestablish cardiology care.  It seems like he is prior to his been hurt significant from having the stroke.   He now has a somewhat unsteady gait or balance.  Other than that his only limitation has been ataxia.  That also is quite frustrating to Friedman. He has to use a walker to walk and sometimes if he goes too fast it is difficult.  He says occasionally he will feel some shortness of breath and chest discomfort if he overdoes it.  That is also because he is quite frustrated.  He has not had any symptoms at rest, and only minimal exertional symptoms if any.  He has no real sensation whatsoever about A. fib.  He did notice A. fib when it was initially diagnosed, but has no sense of being in A. fib now.  In fact he is not aware of his heart rate being over 100. He does have poor balance and some dizziness, but no recurrent TIA or amaurosis fugax/CVA symptoms.  No syncope or near syncope. With routine activities, no chest pain or dyspnea. No melena, hematochezia or hematuria.  No real epistaxis besides some mild blood when he cleaned his nose.  No claudication.  ROS: A comprehensive was performed. Review of Systems  Constitutional: Positive for malaise/fatigue (Lack of get up and go.  Partially because he has a somewhat unsteady gait since his stroke.). Negative for chills and fever.  HENT: Negative for nosebleeds (Occasionally will have a small amount of blood clots when he clears his nose, but no true epistaxis).   Eyes:  Seen an ophthalmologist for possible diabetic retinopathy  Respiratory: Positive for shortness of breath (With exertion).   Cardiovascular: Positive for leg swelling.  Gastrointestinal: Negative for blood in stool, constipation, heartburn and melena.  Genitourinary: Negative for dysuria and hematuria.  Musculoskeletal: Positive for falls (Not as long as he uses a walker).  Neurological: Negative for focal weakness (No new stroke symptoms).       Unsteady gait.  Poor balance  Psychiatric/Behavioral: Positive for depression. Negative for memory loss (May be some mild memory issues).  The patient is not nervous/anxious and does not have insomnia.        His prior has been hurt by having a stroke and not being able to walk like he used to.  All other systems reviewed and are negative.  I have reviewed and (if needed) personally updated the patient's problem list, medications, allergies, past medical and surgical history, social and family history.   Past Medical History:  Diagnosis Date  . Atrial fibrillation ([place]) 2003   Initially on Coumadin, now on Xarelto  . Cataract    Bilateral cataracts.  . Diabetes mellitus without complication ([place])    type II  . Diabetic peripheral neuropathy associated with type 2 diabetes mellitus ([place])   . Glaucoma    left eye  . Hx of radiation therapy 08/15/13- 10/06/13   prostate 7600 cGy 38 sessions  . Hyperlipidemia with target LDL less than 70   . Neuropathy    diabetic, in feet  . Personal history of fall 09/26/13   front entrance of [place]  . Prostate cancer [facility])    History of radiation therapy    Past Surgical History:  Procedure Laterality Date  . PROSTATE BIOPSY  12/25/11   Adenocarcinoma, gleason 6    Current Meds  Medication Sig  . atorvastatin (LIPITOR) 40 MG tablet Take 1 tablet (40 mg total) by mouth daily at 6 PM.  . insulin glargine (LANTUS) 100 UNIT/ML injection Inject 0.42 mLs (42 Units total) into the skin at bedtime. (Patient taking differently: Inject 35 Units into the skin at bedtime. )  . Insulin Lispro (HUMALOG KWIKPEN [place]) Inject 5 Units into the skin daily before breakfast.  . latanoprost (XALATAN) 0.005 % ophthalmic solution Place 1 drop into both eyes at bedtime.   . metoprolol tartrate (LOPRESSOR) 25 MG tablet Take one-half by mouth twice daily. May take additional one-half tab daily as needed.  . rivaroxaban (XARELTO) 20 MG TABS tablet Take 1 tablet (20 mg total) by mouth daily with supper.  . tamsulosin (FLOMAX) 0.4 MG CAPS capsule Take 0.4 mg by mouth daily.  . [DISCONTINUED] metoprolol tartrate  (LOPRESSOR) 25 MG tablet Take 0.5 tablets (12.5 mg total) by mouth 2 (two) times daily.  . [DISCONTINUED] metoprolol tartrate (LOPRESSOR) 25 MG tablet Take 1 tablet (25 mg total) by mouth 2 (two) times daily. May take additional 12.5mg  daily as needed.  . [DISCONTINUED] metoprolol tartrate (LOPRESSOR) 25 MG tablet Take 1 tablet by mouth twice daily. May take additional 12.5mg  daily as needed.    Allergies  Allergen Reactions  . Aureomycin [Chlortetracycline] Itching and Rash    Reaction approximately 1956    Social History   Socioeconomic History  . Marital status: Widowed    Spouse name: None  . Number of children: 4  . Years of education: None  . Highest education level: None  Social Needs  . Financial resource strain: None  . Food insecurity - worry: None  . Food insecurity -  inability: None  . Transportation needs - medical: None  . Transportation needs - non-medical: None  Occupational History  . Occupation: Retired     Comment: [employer]  Tobacco Use  . Smoking status: Never Smoker  . Smokeless tobacco: Never Used  Substance and Sexual Activity  . Alcohol use: No  . Drug use: No  . Sexual activity: None  Other Topics Concern  . None  Social History Narrative   Pt lives alone -widowed with long-term companion.     Has 2 sons, one in [place], [place] and the other in [place].     His son Star Cheese. lives here locally.  Phone [phone].   He is retired [employer] worker.  Retired in 1993 after 37 years of work.  1 son lives by.  He walks with a walker.  He has diabetic neuropathy.  family history includes Cancer (age of onset: 21) in his sister; Heart attack (age of onset: 52) in his father; Heart disease in his mother.  Wt Readings from Last 3 Encounters:  01/14/17 218 lb (98.9 kg)  01/22/16 236 lb (107 kg)  11/25/15 232 lb 9.4 oz (105.5 kg)    PHYSICAL EXAM BP 132/78   Pulse (!) 105   Ht 6\' 1"  (1.854 m)   Wt 218 lb (98.9 kg)   BMI 28.76 kg/m    Physical Exam  Constitutional: He is oriented to person, place, and time. He appears well-developed and well-nourished. No distress.  Well-groomed.  Somewhat flushed appearing.  But healthy.  HENT:  Head: Normocephalic and atraumatic.  Mouth/Throat: Oropharynx is clear and moist. No oropharyngeal exudate.  Very hard of hearing  Eyes: Conjunctivae and EOM are normal. No scleral icterus.  Secondary to cataracts pupils are not equal but are reactive to light  Neck: Normal range of motion. Neck supple. No hepatojugular reflux and no JVD present. Carotid bruit is not present.  Cardiovascular: S1 normal and S2 normal. An irregularly irregular rhythm present. Tachycardia present. PMI is not displaced. Exam reveals distant heart sounds and decreased pulses (Mildly diminished pedal pulses). Exam reveals no gallop and no friction rub.  No murmur heard. Pulmonary/Chest: Effort normal and breath sounds normal. No respiratory distress. He has no wheezes. He has no rales.  Mild interstitial sounds, but no rales or  Abdominal: Soft. Bowel sounds are normal. He exhibits no distension. There is no tenderness. There is no rebound.  Obese abdomen  Musculoskeletal: Normal range of motion. He exhibits edema (Trivial).  Neurological: He is alert and oriented to person, place, and time. No cranial nerve deficit.  Skin: Skin is warm.  Somewhat dry scaly skin  Psychiatric: He has a normal mood and affect. His behavior is normal. Judgment and thought content normal.  Nursing note and vitals reviewed.    Adult ECG Report  Rate: 105 ;  Rhythm: normal sinus rhythm, atrial fibrillation and RVR.  Cannot exclude septal MI, age undetermined.;  Otherwise normal axis, intervals and durations.  Narrative Interpretation: Stable EKG   Other studies Reviewed: Additional studies/ records that were reviewed today include:  Recent Labs:   Lab Results  Component Value Date   CREATININE 0.90 12/04/2016   BUN 18 11/27/2015    NA 135 11/27/2015   K 3.6 11/27/2015   CL 105 11/27/2015   CO2 19 (L) 11/27/2015   Lab Results  Component Value Date   CHOL 146 11/26/2015   HDL 30 (L) 11/26/2015   [place] 95 11/26/2015  TRIG 105 11/26/2015   CHOLHDL 4.9 11/26/2015    ASSESSMENT / PLAN: Problem List Items Addressed This Visit    History of CVA (cerebrovascular accident) (Chronic)    This was in the setting of being anticoagulate with warfarin.  I would suspect that his INR levels were not adequately controlled and therefore he was switched to Xarelto.  He is tolerating Xarelto well.  No real residual symptoms besides having some aphasia. He remains active.. Continue Xarelto.      Hyperlipemia (Chronic)    Currently on atorvastatin 40 mg daily.  I would pay attention to this in order to see if he potentially would benefit from not being on a statin at least for short-term in order to reassess mental status.  He does seem to be having some mild memory issues.      Relevant Medications   nitroGLYCERIN (NITROSTAT) 0.4 MG SL tablet   metoprolol tartrate (LOPRESSOR) 25 MG tablet   Persistent atrial fibrillation (HCC) - Primary (Chronic)    He is in A. fib now with borderline RVR rates in the 105 range.  He takes minimal dose of metoprolol.  He does not notice his heart rate being fast. He is also on Xarelto for anticoagulation and not having major bleeding issues. Since he is relatively asymptomatic with his A. fib, I think we simply can work for rate control since his been very long time from his initial diagnosis.  Plan will be increased metoprolol to 12.5 mg mg twice daily (he is only taking it once a day) for additional rate control.  If he is only taking it once a day medication, will need to convert to Toprol as opposed to Lopressor. Continue Xarelto.      Relevant Medications   nitroGLYCERIN (NITROSTAT) 0.4 MG SL tablet   metoprolol tartrate (LOPRESSOR) 25 MG tablet   Other Relevant Orders   EKG  12-Lead (Completed)   Stable angina (HCC) (Chronic)    Given a diagnosis of stable angina, but I am no documentation of ever having had an ischemic evaluation. No current anginal symptoms at this point.  He is on a beta-blocker and statin. Continue activity.  He only noticed some unusual chest discomfort occasionally with heavy exertion.  As far as I can tell I see no history of an ischemic evaluation.  He would prefer not to do pursue any ischemic evaluation at this time unless he has more worsening symptoms.  We talked about different options of stress test versus coronary CTA, and he is not instructed in at this time.      Relevant Medications   nitroGLYCERIN (NITROSTAT) 0.4 MG SL tablet   metoprolol tartrate (LOPRESSOR) 25 MG tablet      Current medicines are reviewed at length with the patient today. (+/- concerns) n/a The following changes have been made: n/a   Patient Instructions  Your physician has recommended you make the following change in your medication:  -- INCREASE metoprolol tartrate to 12.5mg  twice daily -- Dr. Ellyn Hack has recommended the following meds AS NEEDED:  - NTG 0.4mg  as needed for chest pain, shortness of breath  - metoprolol tartrate 12.5mg  once daily as needed for fast heart rate  Your physician recommends that you schedule a follow-up appointment in 3-4 months with Dr. Ellyn Hack     Studies Ordered:   Orders Placed This Encounter  Procedures  . EKG 12-Lead      Glenetta Hew, M.D., M.S. Interventional Cardiologist   Pager # [phone] Phone #  [place] [place], [place] [address]

## 2017-01-14 NOTE — Patient Instructions (Addendum)
Your physician has recommended you make the following change in your medication:  -- INCREASE metoprolol tartrate to 12.5mg  twice daily -- Dr. Ellyn Hack has recommended the following meds AS NEEDED:  - NTG 0.4mg  as needed for chest pain, shortness of breath  - metoprolol tartrate 12.5mg  once daily as needed for fast heart rate  Your physician recommends that you schedule a follow-up appointment in 3-4 months with Dr. Ellyn Hack

## 2017-01-15 DIAGNOSIS — E1129 Type 2 diabetes mellitus with other diabetic kidney complication: Secondary | ICD-10-CM | POA: Diagnosis not present

## 2017-01-15 DIAGNOSIS — Z6829 Body mass index (BMI) 29.0-29.9, adult: Secondary | ICD-10-CM | POA: Diagnosis not present

## 2017-01-15 DIAGNOSIS — I129 Hypertensive chronic kidney disease with stage 1 through stage 4 chronic kidney disease, or unspecified chronic kidney disease: Secondary | ICD-10-CM | POA: Diagnosis not present

## 2017-01-15 DIAGNOSIS — E1140 Type 2 diabetes mellitus with diabetic neuropathy, unspecified: Secondary | ICD-10-CM | POA: Diagnosis not present

## 2017-01-15 DIAGNOSIS — F321 Major depressive disorder, single episode, moderate: Secondary | ICD-10-CM | POA: Diagnosis not present

## 2017-01-18 DIAGNOSIS — E161 Other hypoglycemia: Secondary | ICD-10-CM | POA: Diagnosis not present

## 2017-01-20 DIAGNOSIS — R3915 Urgency of urination: Secondary | ICD-10-CM | POA: Diagnosis not present

## 2017-01-20 DIAGNOSIS — N5201 Erectile dysfunction due to arterial insufficiency: Secondary | ICD-10-CM | POA: Diagnosis not present

## 2017-01-20 DIAGNOSIS — C61 Malignant neoplasm of prostate: Secondary | ICD-10-CM | POA: Diagnosis not present

## 2017-01-21 ENCOUNTER — Encounter: Payer: Self-pay | Admitting: Cardiology

## 2017-01-21 NOTE — Assessment & Plan Note (Addendum)
Given a diagnosis of stable angina, but I am no documentation of ever having had an ischemic evaluation. No current anginal symptoms at this point.  He is on a beta-blocker and statin. Continue activity.  He only noticed some unusual chest discomfort occasionally with heavy exertion.  As far as I can tell I see no history of an ischemic evaluation.  He would prefer not to do pursue any ischemic evaluation at this time unless he has more worsening symptoms.  We talked about different options of stress test versus coronary CTA, and he is not instructed in at this time.

## 2017-01-21 NOTE — Assessment & Plan Note (Signed)
Currently on atorvastatin 40 mg daily.  I would pay attention to this in order to see if he potentially would benefit from not being on a statin at least for short-term in order to reassess mental status.  He does seem to be having some mild memory issues.

## 2017-01-21 NOTE — Assessment & Plan Note (Addendum)
He is in A. fib now with borderline RVR rates in the 105 range.  He takes minimal dose of metoprolol.  He does not notice his heart rate being fast. He is also on Xarelto for anticoagulation and not having major bleeding issues. Since he is relatively asymptomatic with his A. fib, I think we simply can work for rate control since his been very long time from his initial diagnosis.  Plan will be increased metoprolol to 12.5 mg mg twice daily (he is only taking it once a day) for additional rate control.  If he is only taking it once a day medication, will need to convert to Toprol as opposed to Lopressor. Continue Xarelto.

## 2017-01-21 NOTE — Assessment & Plan Note (Signed)
This was in the setting of being anticoagulate with warfarin.  I would suspect that his INR levels were not adequately controlled and therefore he was switched to Xarelto.  He is tolerating Xarelto well.  No real residual symptoms besides having some aphasia. He remains active.. Continue Xarelto.

## 2017-01-30 DIAGNOSIS — E113313 Type 2 diabetes mellitus with moderate nonproliferative diabetic retinopathy with macular edema, bilateral: Secondary | ICD-10-CM | POA: Diagnosis not present

## 2017-01-30 DIAGNOSIS — H35423 Microcystoid degeneration of retina, bilateral: Secondary | ICD-10-CM | POA: Diagnosis not present

## 2017-01-30 DIAGNOSIS — H35041 Retinal micro-aneurysms, unspecified, right eye: Secondary | ICD-10-CM | POA: Diagnosis not present

## 2017-01-30 DIAGNOSIS — H43813 Vitreous degeneration, bilateral: Secondary | ICD-10-CM | POA: Diagnosis not present

## 2017-02-05 DIAGNOSIS — E1042 Type 1 diabetes mellitus with diabetic polyneuropathy: Secondary | ICD-10-CM | POA: Diagnosis not present

## 2017-02-05 DIAGNOSIS — I87319 Chronic venous hypertension (idiopathic) with ulcer of unspecified lower extremity: Secondary | ICD-10-CM | POA: Diagnosis not present

## 2017-02-05 DIAGNOSIS — I129 Hypertensive chronic kidney disease with stage 1 through stage 4 chronic kidney disease, or unspecified chronic kidney disease: Secondary | ICD-10-CM | POA: Diagnosis not present

## 2017-02-05 DIAGNOSIS — Z6830 Body mass index (BMI) 30.0-30.9, adult: Secondary | ICD-10-CM | POA: Diagnosis not present

## 2017-02-05 DIAGNOSIS — I480 Paroxysmal atrial fibrillation: Secondary | ICD-10-CM | POA: Diagnosis not present

## 2017-02-05 DIAGNOSIS — E1129 Type 2 diabetes mellitus with other diabetic kidney complication: Secondary | ICD-10-CM | POA: Diagnosis not present

## 2017-02-05 DIAGNOSIS — I509 Heart failure, unspecified: Secondary | ICD-10-CM | POA: Diagnosis not present

## 2017-02-05 DIAGNOSIS — F321 Major depressive disorder, single episode, moderate: Secondary | ICD-10-CM | POA: Diagnosis not present

## 2017-02-09 DIAGNOSIS — E1140 Type 2 diabetes mellitus with diabetic neuropathy, unspecified: Secondary | ICD-10-CM | POA: Diagnosis not present

## 2017-02-09 DIAGNOSIS — Z6826 Body mass index (BMI) 26.0-26.9, adult: Secondary | ICD-10-CM | POA: Diagnosis not present

## 2017-02-09 DIAGNOSIS — I87319 Chronic venous hypertension (idiopathic) with ulcer of unspecified lower extremity: Secondary | ICD-10-CM | POA: Diagnosis not present

## 2017-02-09 DIAGNOSIS — I509 Heart failure, unspecified: Secondary | ICD-10-CM | POA: Diagnosis not present

## 2017-02-09 DIAGNOSIS — F321 Major depressive disorder, single episode, moderate: Secondary | ICD-10-CM | POA: Diagnosis not present

## 2017-02-09 DIAGNOSIS — I129 Hypertensive chronic kidney disease with stage 1 through stage 4 chronic kidney disease, or unspecified chronic kidney disease: Secondary | ICD-10-CM | POA: Diagnosis not present

## 2017-02-09 DIAGNOSIS — E1129 Type 2 diabetes mellitus with other diabetic kidney complication: Secondary | ICD-10-CM | POA: Diagnosis not present

## 2017-02-09 DIAGNOSIS — G6289 Other specified polyneuropathies: Secondary | ICD-10-CM | POA: Diagnosis not present

## 2017-02-09 DIAGNOSIS — I480 Paroxysmal atrial fibrillation: Secondary | ICD-10-CM | POA: Diagnosis not present

## 2017-02-13 DIAGNOSIS — I87319 Chronic venous hypertension (idiopathic) with ulcer of unspecified lower extremity: Secondary | ICD-10-CM | POA: Diagnosis not present

## 2017-02-13 DIAGNOSIS — Z6830 Body mass index (BMI) 30.0-30.9, adult: Secondary | ICD-10-CM | POA: Diagnosis not present

## 2017-02-13 DIAGNOSIS — G629 Polyneuropathy, unspecified: Secondary | ICD-10-CM | POA: Diagnosis not present

## 2017-02-13 DIAGNOSIS — I509 Heart failure, unspecified: Secondary | ICD-10-CM | POA: Diagnosis not present

## 2017-02-13 DIAGNOSIS — E1129 Type 2 diabetes mellitus with other diabetic kidney complication: Secondary | ICD-10-CM | POA: Diagnosis not present

## 2017-02-13 DIAGNOSIS — F321 Major depressive disorder, single episode, moderate: Secondary | ICD-10-CM | POA: Diagnosis not present

## 2017-02-13 DIAGNOSIS — R251 Tremor, unspecified: Secondary | ICD-10-CM | POA: Diagnosis not present

## 2017-02-17 DIAGNOSIS — I878 Other specified disorders of veins: Secondary | ICD-10-CM | POA: Diagnosis not present

## 2017-02-17 DIAGNOSIS — E1165 Type 2 diabetes mellitus with hyperglycemia: Secondary | ICD-10-CM | POA: Diagnosis not present

## 2017-02-17 DIAGNOSIS — E1142 Type 2 diabetes mellitus with diabetic polyneuropathy: Secondary | ICD-10-CM | POA: Diagnosis not present

## 2017-02-17 DIAGNOSIS — E669 Obesity, unspecified: Secondary | ICD-10-CM | POA: Diagnosis not present

## 2017-02-17 DIAGNOSIS — Z7901 Long term (current) use of anticoagulants: Secondary | ICD-10-CM | POA: Diagnosis not present

## 2017-02-17 DIAGNOSIS — E1122 Type 2 diabetes mellitus with diabetic chronic kidney disease: Secondary | ICD-10-CM | POA: Diagnosis not present

## 2017-02-17 DIAGNOSIS — N182 Chronic kidney disease, stage 2 (mild): Secondary | ICD-10-CM | POA: Diagnosis not present

## 2017-02-17 DIAGNOSIS — L97221 Non-pressure chronic ulcer of left calf limited to breakdown of skin: Secondary | ICD-10-CM | POA: Diagnosis not present

## 2017-02-17 DIAGNOSIS — F321 Major depressive disorder, single episode, moderate: Secondary | ICD-10-CM | POA: Diagnosis not present

## 2017-02-17 DIAGNOSIS — E785 Hyperlipidemia, unspecified: Secondary | ICD-10-CM | POA: Diagnosis not present

## 2017-02-17 DIAGNOSIS — I509 Heart failure, unspecified: Secondary | ICD-10-CM | POA: Diagnosis not present

## 2017-02-17 DIAGNOSIS — Z794 Long term (current) use of insulin: Secondary | ICD-10-CM | POA: Diagnosis not present

## 2017-02-17 DIAGNOSIS — I130 Hypertensive heart and chronic kidney disease with heart failure and stage 1 through stage 4 chronic kidney disease, or unspecified chronic kidney disease: Secondary | ICD-10-CM | POA: Diagnosis not present

## 2017-02-17 DIAGNOSIS — I4891 Unspecified atrial fibrillation: Secondary | ICD-10-CM | POA: Diagnosis not present

## 2017-02-17 DIAGNOSIS — L97211 Non-pressure chronic ulcer of right calf limited to breakdown of skin: Secondary | ICD-10-CM | POA: Diagnosis not present

## 2017-02-20 ENCOUNTER — Telehealth: Payer: Self-pay | Admitting: Cardiology

## 2017-02-20 ENCOUNTER — Other Ambulatory Visit: Payer: Self-pay | Admitting: *Deleted

## 2017-02-20 DIAGNOSIS — I509 Heart failure, unspecified: Secondary | ICD-10-CM | POA: Diagnosis not present

## 2017-02-20 DIAGNOSIS — I878 Other specified disorders of veins: Secondary | ICD-10-CM | POA: Diagnosis not present

## 2017-02-20 DIAGNOSIS — L97221 Non-pressure chronic ulcer of left calf limited to breakdown of skin: Secondary | ICD-10-CM | POA: Diagnosis not present

## 2017-02-20 DIAGNOSIS — E1165 Type 2 diabetes mellitus with hyperglycemia: Secondary | ICD-10-CM | POA: Diagnosis not present

## 2017-02-20 DIAGNOSIS — I130 Hypertensive heart and chronic kidney disease with heart failure and stage 1 through stage 4 chronic kidney disease, or unspecified chronic kidney disease: Secondary | ICD-10-CM | POA: Diagnosis not present

## 2017-02-20 DIAGNOSIS — L97211 Non-pressure chronic ulcer of right calf limited to breakdown of skin: Secondary | ICD-10-CM | POA: Diagnosis not present

## 2017-02-20 MED ORDER — METOPROLOL TARTRATE 25 MG PO TABS: ORAL_TABLET | ORAL | 1 refills | Status: DC

## 2017-02-20 NOTE — Telephone Encounter (Signed)
New Message    *STAT* If patient is at the pharmacy, call can be transferred to refill team.   1. Which medications need to be refilled? (please list name of each medication and dose if known) metoprolol tartrate (LOPRESSOR) 25 MG tablet   2. Which pharmacy/location (including street and city if local pharmacy) is medication to be sent to? [facility]  3. Do they need a 30 day or 90 day supply? 90 day

## 2017-02-23 DIAGNOSIS — L97211 Non-pressure chronic ulcer of right calf limited to breakdown of skin: Secondary | ICD-10-CM | POA: Diagnosis not present

## 2017-02-23 DIAGNOSIS — I130 Hypertensive heart and chronic kidney disease with heart failure and stage 1 through stage 4 chronic kidney disease, or unspecified chronic kidney disease: Secondary | ICD-10-CM | POA: Diagnosis not present

## 2017-02-23 DIAGNOSIS — I509 Heart failure, unspecified: Secondary | ICD-10-CM | POA: Diagnosis not present

## 2017-02-23 DIAGNOSIS — I878 Other specified disorders of veins: Secondary | ICD-10-CM | POA: Diagnosis not present

## 2017-02-23 DIAGNOSIS — L97221 Non-pressure chronic ulcer of left calf limited to breakdown of skin: Secondary | ICD-10-CM | POA: Diagnosis not present

## 2017-02-23 DIAGNOSIS — E1165 Type 2 diabetes mellitus with hyperglycemia: Secondary | ICD-10-CM | POA: Diagnosis not present

## 2017-02-26 DIAGNOSIS — I130 Hypertensive heart and chronic kidney disease with heart failure and stage 1 through stage 4 chronic kidney disease, or unspecified chronic kidney disease: Secondary | ICD-10-CM | POA: Diagnosis not present

## 2017-02-26 DIAGNOSIS — E1165 Type 2 diabetes mellitus with hyperglycemia: Secondary | ICD-10-CM | POA: Diagnosis not present

## 2017-02-26 DIAGNOSIS — L97211 Non-pressure chronic ulcer of right calf limited to breakdown of skin: Secondary | ICD-10-CM | POA: Diagnosis not present

## 2017-02-26 DIAGNOSIS — L97221 Non-pressure chronic ulcer of left calf limited to breakdown of skin: Secondary | ICD-10-CM | POA: Diagnosis not present

## 2017-02-26 DIAGNOSIS — I878 Other specified disorders of veins: Secondary | ICD-10-CM | POA: Diagnosis not present

## 2017-02-26 DIAGNOSIS — I509 Heart failure, unspecified: Secondary | ICD-10-CM | POA: Diagnosis not present

## 2017-03-02 DIAGNOSIS — I509 Heart failure, unspecified: Secondary | ICD-10-CM | POA: Diagnosis not present

## 2017-03-02 DIAGNOSIS — I130 Hypertensive heart and chronic kidney disease with heart failure and stage 1 through stage 4 chronic kidney disease, or unspecified chronic kidney disease: Secondary | ICD-10-CM | POA: Diagnosis not present

## 2017-03-02 DIAGNOSIS — L97211 Non-pressure chronic ulcer of right calf limited to breakdown of skin: Secondary | ICD-10-CM | POA: Diagnosis not present

## 2017-03-02 DIAGNOSIS — E1165 Type 2 diabetes mellitus with hyperglycemia: Secondary | ICD-10-CM | POA: Diagnosis not present

## 2017-03-02 DIAGNOSIS — L97221 Non-pressure chronic ulcer of left calf limited to breakdown of skin: Secondary | ICD-10-CM | POA: Diagnosis not present

## 2017-03-02 DIAGNOSIS — I878 Other specified disorders of veins: Secondary | ICD-10-CM | POA: Diagnosis not present

## 2017-03-09 DIAGNOSIS — L97221 Non-pressure chronic ulcer of left calf limited to breakdown of skin: Secondary | ICD-10-CM | POA: Diagnosis not present

## 2017-03-09 DIAGNOSIS — I509 Heart failure, unspecified: Secondary | ICD-10-CM | POA: Diagnosis not present

## 2017-03-09 DIAGNOSIS — L97211 Non-pressure chronic ulcer of right calf limited to breakdown of skin: Secondary | ICD-10-CM | POA: Diagnosis not present

## 2017-03-09 DIAGNOSIS — I130 Hypertensive heart and chronic kidney disease with heart failure and stage 1 through stage 4 chronic kidney disease, or unspecified chronic kidney disease: Secondary | ICD-10-CM | POA: Diagnosis not present

## 2017-03-09 DIAGNOSIS — I878 Other specified disorders of veins: Secondary | ICD-10-CM | POA: Diagnosis not present

## 2017-03-09 DIAGNOSIS — E1165 Type 2 diabetes mellitus with hyperglycemia: Secondary | ICD-10-CM | POA: Diagnosis not present

## 2017-03-19 DIAGNOSIS — I878 Other specified disorders of veins: Secondary | ICD-10-CM | POA: Diagnosis not present

## 2017-03-19 DIAGNOSIS — L97211 Non-pressure chronic ulcer of right calf limited to breakdown of skin: Secondary | ICD-10-CM | POA: Diagnosis not present

## 2017-03-19 DIAGNOSIS — I509 Heart failure, unspecified: Secondary | ICD-10-CM | POA: Diagnosis not present

## 2017-03-19 DIAGNOSIS — E1165 Type 2 diabetes mellitus with hyperglycemia: Secondary | ICD-10-CM | POA: Diagnosis not present

## 2017-03-19 DIAGNOSIS — I130 Hypertensive heart and chronic kidney disease with heart failure and stage 1 through stage 4 chronic kidney disease, or unspecified chronic kidney disease: Secondary | ICD-10-CM | POA: Diagnosis not present

## 2017-03-19 DIAGNOSIS — L97221 Non-pressure chronic ulcer of left calf limited to breakdown of skin: Secondary | ICD-10-CM | POA: Diagnosis not present

## 2017-03-20 DIAGNOSIS — H35041 Retinal micro-aneurysms, unspecified, right eye: Secondary | ICD-10-CM | POA: Diagnosis not present

## 2017-03-23 DIAGNOSIS — I878 Other specified disorders of veins: Secondary | ICD-10-CM | POA: Diagnosis not present

## 2017-03-23 DIAGNOSIS — L97211 Non-pressure chronic ulcer of right calf limited to breakdown of skin: Secondary | ICD-10-CM | POA: Diagnosis not present

## 2017-03-23 DIAGNOSIS — I130 Hypertensive heart and chronic kidney disease with heart failure and stage 1 through stage 4 chronic kidney disease, or unspecified chronic kidney disease: Secondary | ICD-10-CM | POA: Diagnosis not present

## 2017-03-23 DIAGNOSIS — E1165 Type 2 diabetes mellitus with hyperglycemia: Secondary | ICD-10-CM | POA: Diagnosis not present

## 2017-03-23 DIAGNOSIS — L97221 Non-pressure chronic ulcer of left calf limited to breakdown of skin: Secondary | ICD-10-CM | POA: Diagnosis not present

## 2017-03-23 DIAGNOSIS — I509 Heart failure, unspecified: Secondary | ICD-10-CM | POA: Diagnosis not present

## 2017-03-27 DIAGNOSIS — I87319 Chronic venous hypertension (idiopathic) with ulcer of unspecified lower extremity: Secondary | ICD-10-CM | POA: Diagnosis not present

## 2017-03-27 DIAGNOSIS — E1129 Type 2 diabetes mellitus with other diabetic kidney complication: Secondary | ICD-10-CM | POA: Diagnosis not present

## 2017-03-27 DIAGNOSIS — N182 Chronic kidney disease, stage 2 (mild): Secondary | ICD-10-CM | POA: Diagnosis not present

## 2017-03-27 DIAGNOSIS — I6389 Other cerebral infarction: Secondary | ICD-10-CM | POA: Diagnosis not present

## 2017-03-27 DIAGNOSIS — Z7901 Long term (current) use of anticoagulants: Secondary | ICD-10-CM | POA: Diagnosis not present

## 2017-03-27 DIAGNOSIS — Z6830 Body mass index (BMI) 30.0-30.9, adult: Secondary | ICD-10-CM | POA: Diagnosis not present

## 2017-03-27 DIAGNOSIS — Z9114 Patient's other noncompliance with medication regimen: Secondary | ICD-10-CM | POA: Diagnosis not present

## 2017-03-27 DIAGNOSIS — C61 Malignant neoplasm of prostate: Secondary | ICD-10-CM | POA: Diagnosis not present

## 2017-03-27 DIAGNOSIS — F331 Major depressive disorder, recurrent, moderate: Secondary | ICD-10-CM | POA: Diagnosis not present

## 2017-03-27 DIAGNOSIS — G6289 Other specified polyneuropathies: Secondary | ICD-10-CM | POA: Diagnosis not present

## 2017-03-27 DIAGNOSIS — E668 Other obesity: Secondary | ICD-10-CM | POA: Diagnosis not present

## 2017-03-27 DIAGNOSIS — I129 Hypertensive chronic kidney disease with stage 1 through stage 4 chronic kidney disease, or unspecified chronic kidney disease: Secondary | ICD-10-CM | POA: Diagnosis not present

## 2017-03-30 DIAGNOSIS — E1165 Type 2 diabetes mellitus with hyperglycemia: Secondary | ICD-10-CM | POA: Diagnosis not present

## 2017-03-30 DIAGNOSIS — L97211 Non-pressure chronic ulcer of right calf limited to breakdown of skin: Secondary | ICD-10-CM | POA: Diagnosis not present

## 2017-03-30 DIAGNOSIS — I878 Other specified disorders of veins: Secondary | ICD-10-CM | POA: Diagnosis not present

## 2017-03-30 DIAGNOSIS — I509 Heart failure, unspecified: Secondary | ICD-10-CM | POA: Diagnosis not present

## 2017-03-30 DIAGNOSIS — I130 Hypertensive heart and chronic kidney disease with heart failure and stage 1 through stage 4 chronic kidney disease, or unspecified chronic kidney disease: Secondary | ICD-10-CM | POA: Diagnosis not present

## 2017-03-30 DIAGNOSIS — L97221 Non-pressure chronic ulcer of left calf limited to breakdown of skin: Secondary | ICD-10-CM | POA: Diagnosis not present

## 2017-04-06 DIAGNOSIS — L97221 Non-pressure chronic ulcer of left calf limited to breakdown of skin: Secondary | ICD-10-CM | POA: Diagnosis not present

## 2017-04-06 DIAGNOSIS — E1165 Type 2 diabetes mellitus with hyperglycemia: Secondary | ICD-10-CM | POA: Diagnosis not present

## 2017-04-06 DIAGNOSIS — L97211 Non-pressure chronic ulcer of right calf limited to breakdown of skin: Secondary | ICD-10-CM | POA: Diagnosis not present

## 2017-04-06 DIAGNOSIS — I130 Hypertensive heart and chronic kidney disease with heart failure and stage 1 through stage 4 chronic kidney disease, or unspecified chronic kidney disease: Secondary | ICD-10-CM | POA: Diagnosis not present

## 2017-04-06 DIAGNOSIS — I509 Heart failure, unspecified: Secondary | ICD-10-CM | POA: Diagnosis not present

## 2017-04-06 DIAGNOSIS — I878 Other specified disorders of veins: Secondary | ICD-10-CM | POA: Diagnosis not present

## 2017-04-10 ENCOUNTER — Ambulatory Visit: Payer: Medicare Other | Admitting: Cardiology

## 2017-04-10 ENCOUNTER — Ambulatory Visit (INDEPENDENT_AMBULATORY_CARE_PROVIDER_SITE_OTHER): Payer: Medicare Other | Admitting: Cardiology

## 2017-04-10 ENCOUNTER — Encounter: Payer: Self-pay | Admitting: Cardiology

## 2017-04-10 VITALS — BP 152/86 | HR 74 | Ht 73.0 in | Wt 238.0 lb

## 2017-04-10 DIAGNOSIS — I5033 Acute on chronic diastolic (congestive) heart failure: Secondary | ICD-10-CM | POA: Insufficient documentation

## 2017-04-10 DIAGNOSIS — E785 Hyperlipidemia, unspecified: Secondary | ICD-10-CM

## 2017-04-10 DIAGNOSIS — L308 Other specified dermatitis: Secondary | ICD-10-CM | POA: Diagnosis not present

## 2017-04-10 DIAGNOSIS — I4819 Other persistent atrial fibrillation: Secondary | ICD-10-CM

## 2017-04-10 DIAGNOSIS — L570 Actinic keratosis: Secondary | ICD-10-CM | POA: Diagnosis not present

## 2017-04-10 DIAGNOSIS — I5032 Chronic diastolic (congestive) heart failure: Secondary | ICD-10-CM

## 2017-04-10 DIAGNOSIS — L821 Other seborrheic keratosis: Secondary | ICD-10-CM | POA: Diagnosis not present

## 2017-04-10 DIAGNOSIS — I481 Persistent atrial fibrillation: Secondary | ICD-10-CM

## 2017-04-10 DIAGNOSIS — I208 Other forms of angina pectoris: Secondary | ICD-10-CM

## 2017-04-10 DIAGNOSIS — F331 Major depressive disorder, recurrent, moderate: Secondary | ICD-10-CM | POA: Diagnosis not present

## 2017-04-10 DIAGNOSIS — I63039 Cerebral infarction due to thrombosis of unspecified carotid artery: Secondary | ICD-10-CM

## 2017-04-10 DIAGNOSIS — E1129 Type 2 diabetes mellitus with other diabetic kidney complication: Secondary | ICD-10-CM | POA: Diagnosis not present

## 2017-04-10 DIAGNOSIS — R600 Localized edema: Secondary | ICD-10-CM | POA: Diagnosis not present

## 2017-04-10 DIAGNOSIS — Z6831 Body mass index (BMI) 31.0-31.9, adult: Secondary | ICD-10-CM | POA: Diagnosis not present

## 2017-04-10 NOTE — Progress Notes (Signed)
PCP: Charles Pao, MD  Clinic Note: Chief Complaint  Patient presents with  . Follow-up    No new complaints  . Atrial Fibrillation    Asymptomatic    HPI: Charles Friedman is a 84 y.o. male who is being seen today for the evaluation of history of atrial fibrillation and stroke at the request of Tisovec, Fransico Him, MD. Long-standing chronic atrial fibrillation -initially diagnosed roughly 10 years ago (~2007-2008) --started on Coumadin when he was followed by Charles Friedman.   Unfortunately with Charles Friedman retired, Charles Friedman never followed up with another cardiologist.  He is been followed by his PCP for but roughly 10 years now.   He used to know when he was in A. fib, but now he cannot tell unless he is exerting himself rapidly.   October 2017 he was noted to have acute CVA.  He was switched from Coumadin to Xarelto.  He was also converted from Pravachol to Lipitor.  PCP on November 6. Charles Friedman realized it he had not followed up with cardiology since his stroke the previous year and referred Friedman for cardiology evaluation.  In addition to CVA & . fib he has diabetes mellitus (poorly controlled), hypertension and hyperlipidemia. ?? Dx of Stable Angina - but he is unaware of ischemic evaluation.  Charles Friedman was seen for initial consultation in November for atrial fibrillation which seems to be persistent.  Somewhat unsteady gait with poor balance as a result from his prior stroke.  Really other the limitation he has ataxia which is frustrating.  Uses a walker.  No sensation of being in A. fib.  No heart failure symptoms. --> continued Xarelto & Metoprolol (increased to 12.5 mg BID from daily) along with statin.   Recent Hospitalizations: None  Studies Personally Reviewed - (if available, images/films reviewed: From Epic Chart or Care Everywhere)  None since last visit  As usual, he is accompanied by young lady who I thought was his daughter or daughter-in-law, but if this is actually his  caregiver who accompanies Friedman most of the time.  She probably knows his history better than he does.  Interval History: Donovin presents to follow-up overall doing quite well.  He still can feel his heart changing, beating irregular.  He says when he lies on his left side he can feel his heartbeat but otherwise is doing fine.  He gets short of breath may be after walking about 200 feet, but this is not a change from Friedman.  He is somewhat limited now using a walker.  He has notably improved edema, no longer using Un[place]oot and is now using support stockings.  His swelling is well controlled now. He really does not walk a lot and therefore is unable to really know if he has any exertional chest tightness or pressure.  He has not had any use any nitroglycerin.  When he gets short of breath walking, he simply sits down to catch his breath.  He can do routine things just walking around the apartment however without getting short of breath. No recurrent TIA or amaurosis fugax symptoms.  No syncope or near syncope.  He does have very poor balance and occasional dizziness since his stroke, but has not had any falls or syncope/near syncope.   No claudicationROS: A comprehensive was performed. Review of Systems  Constitutional: Positive for malaise/fatigue (Lack of get up and go.  Partially because he has a somewhat unsteady gait since his stroke.). Negative for chills and  fever.  HENT: Negative for congestion and nosebleeds (Occasionally will have a small amount of blood clots when he clears his nose, but no true epistaxis).   Eyes:       Seen an ophthalmologist for possible diabetic retinopathy  Respiratory: Positive for shortness of breath (With exertion). Negative for cough and wheezing.   Cardiovascular: Positive for leg swelling.  Gastrointestinal: Negative for blood in stool (Occasionally when he wipes hard, but no frank blood), constipation, heartburn and melena.  Genitourinary: Negative for dysuria and  hematuria.  Musculoskeletal: Positive for joint pain. Negative for falls (Not as long as he uses a walker) and neck pain.  Neurological: Negative for focal weakness (No new stroke symptoms).       Unsteady gait.  Poor balance  Psychiatric/Behavioral: Positive for depression and memory loss (May be some mild memory issues). The patient is not nervous/anxious and does not have insomnia.        His prior has been hurt by having a stroke and not being able to walk like he used to.  All other systems reviewed and are negative.  I have reviewed and (if needed) personally updated the patient's problem list, medications, allergies, past medical and surgical history, social and family history.   Past Medical History:  Diagnosis Date  . Atrial fibrillation ([place]) 2003   Initially on Coumadin, now on Xarelto  . Cataract    Bilateral cataracts.  . Diabetes mellitus without complication ([place])    type II  . Diabetic peripheral neuropathy associated with type 2 diabetes mellitus ([place])   . Glaucoma    left eye  . Hx of radiation therapy 08/15/13- 10/06/13   prostate 7600 cGy 38 sessions  . Hyperlipidemia with target LDL less than 70   . Neuropathy    diabetic, in feet  . Personal history of fall 09/26/13   front entrance of [place]  . Prostate cancer [facility])    History of radiation therapy    Past Surgical History:  Procedure Laterality Date  . CAROTID DOPPLERS  11/2015   Mild plaque bilaterally.  Less than 40%.  Antegrade vertebral flow.  Marland Kitchen PROSTATE BIOPSY  12/25/11   Adenocarcinoma, gleason 6  . TRANSTHORACIC ECHOCARDIOGRAM  11/2015   In setting of CVA: Technically difficult.  Normal LV size and function.  EF 55-60%.  No regional wall motion normality.  Moderate LA dilation, mild RA dilation.  Mildly increased PA pressures.    Current Meds  Medication Sig  . atorvastatin (LIPITOR) 40 MG tablet Take 1 tablet (40 mg total) by mouth daily at 6 PM.  . furosemide (LASIX) 40 MG tablet Take 1 tablet by  mouth daily.  Marland Kitchen HUMALOG KWIKPEN 100 UNIT/ML KiwkPen Inject 5 Units as directed as directed.  . insulin glargine (LANTUS) 100 UNIT/ML injection Inject 0.42 mLs (42 Units total) into the skin at bedtime. (Patient taking differently: Inject 35 Units into the skin at bedtime. )  . Insulin Lispro (HUMALOG KWIKPEN S.N.P.J.) Inject 5 Units into the skin daily before breakfast.  . latanoprost (XALATAN) 0.005 % ophthalmic solution Place 1 drop into both eyes at bedtime.   Marland Kitchen LYRICA 75 MG capsule Take 1 tablet by mouth 2 (two) times daily.  . metoprolol tartrate (LOPRESSOR) 25 MG tablet Take one-half by mouth twice daily. May take additional one-half tab daily as needed.  . nitroGLYCERIN (NITROSTAT) 0.4 MG SL tablet Place 1 tablet (0.4 mg total) under the tongue every 5 (five) minutes as needed for chest pain.  Marland Kitchen  rivaroxaban (XARELTO) 20 MG TABS tablet Take 1 tablet (20 mg total) by mouth daily with supper.  . tamsulosin (FLOMAX) 0.4 MG CAPS capsule Take 0.4 mg by mouth daily.    Allergies  Allergen Reactions  . Aureomycin [Chlortetracycline] Itching and Rash    Reaction approximately 1956    Social History   Socioeconomic History  . Marital status: Widowed    Spouse name: None  . Number of children: 4  . Years of education: None  . Highest education level: None  Social Needs  . Financial resource strain: None  . Food insecurity - worry: None  . Food insecurity - inability: None  . Transportation needs - medical: None  . Transportation needs - non-medical: None  Occupational History  . Occupation: Retired     Comment: [employer]  Tobacco Use  . Smoking status: Never Smoker  . Smokeless tobacco: Never Used  Substance and Sexual Activity  . Alcohol use: No  . Drug use: No  . Sexual activity: None  Other Topics Concern  . None  Social History Narrative   Pt lives alone -widowed with long-term companion / caregiver.   Currently lives in assisted living facility.   Has 2 sons, one in  [place], [place] and the other in [place].     His son Linda Biehn. lives here locally.  Phone [phone].   He is retired [employer] worker.  Retired in 1993 after 37 years of work.  1 son lives close by.  He walks with a walker.  He has diabetic neuropathy. Accompanied by young woman - "caregiver"  family history includes Cancer (age of onset: 83) in his sister; Heart attack (age of onset: 93) in his father; Heart disease in his mother.  Wt Readings from Last 3 Encounters:  04/10/17 238 lb (108 kg)  01/14/17 218 lb (98.9 kg)  01/22/16 236 lb (107 kg)    PHYSICAL EXAM BP (!) 152/86   Pulse 74   Ht 6\' 1"  (1.854 m)   Wt 238 lb (108 kg)   BMI 31.40 kg/m  Physical Exam  Constitutional: He is oriented to person, place, and time. He appears well-developed and well-nourished. No distress.  Well-groomed.  Somewhat flushed appearing.  But healthy.  HENT:  Head: Normocephalic and atraumatic.  Mouth/Throat: No oropharyngeal exudate.  Very hard of hearing  Eyes:  Secondary to cataracts pupils are not equal but are reactive to light  Neck: Normal range of motion. Neck supple. No hepatojugular reflux and no JVD present. Carotid bruit is not present.  Cardiovascular: S1 normal and S2 normal. An irregularly irregular rhythm present. Tachycardia present. PMI is not displaced. Exam reveals distant heart sounds and decreased pulses (Mildly diminished pedal pulses). Exam reveals no gallop and no friction rub.  No murmur heard. Pulmonary/Chest: Effort normal and breath sounds normal. No respiratory distress. He has no wheezes. He has no rales.  Mild interstitial sounds, but no rales or rhonchi  Abdominal: Soft. Bowel sounds are normal. He exhibits no distension. There is no tenderness. There is no rebound.  Obese abdomen  Musculoskeletal: Normal range of motion. He exhibits edema (Trivial -well controlled with support stockings in place.).  Neurological: He is alert and oriented to person, place,  and time. No cranial nerve deficit.  Skin: Skin is warm.  Somewhat dry scaly skin  Psychiatric: He has a normal mood and affect. His behavior is normal. Judgment and thought content normal.  Poor historian  Nursing note and vitals  reviewed.    Adult ECG Report  Rate: 74;  Rhythm: normal sinus rhythm, atrial fibrillation and CVR.  Cannot exclude septal MI, age undetermined.;  Otherwise normal axis, intervals and durations.  Narrative Interpretation: Stable EKG -rate notably improved down from 105.   Other studies Reviewed: Additional studies/ records that were reviewed today include:  Recent Labs:   Lab Results  Component Value Date   CREATININE 0.90 12/04/2016   BUN 18 11/27/2015   NA 135 11/27/2015   K 3.6 11/27/2015   CL 105 11/27/2015   CO2 19 (L) 11/27/2015   Lab Results  Component Value Date   CHOL 146 11/26/2015   HDL 30 (L) 11/26/2015   LDLCALC 95 11/26/2015   TRIG 105 11/26/2015   CHOLHDL 4.9 11/26/2015    ASSESSMENT / PLAN: Problem List Items Addressed This Visit    Bilateral edema of lower extremity (Chronic)    Well-controlled on stable dose Lasix.  Not had to take additional dosing.  Also using support stockings and foot elevation.       Chronic diastolic heart failure, NYHA class 2 (HCC) (Chronic)    No real active heart failure symptoms that I can tell besides some exertional dyspnea when he pushes it.  This is probably as much related to deconditioning.  On exam on current dose of Lasix.  I think the edema is probably more related to venous stasis then CHF.  However continue wearing support stockings.  Continue low-dose beta-blocker.      Relevant Medications   furosemide (LASIX) 40 MG tablet   Hyperlipidemia with target LDL less than 100    Remains on atorvastatin.   as of October 2017, was at goal.  I do not have labs since.  Has been monitored by PCP.      Relevant Medications   furosemide (LASIX) 40 MG tablet   Persistent atrial fibrillation  (HCC) - Primary (Chronic)    Rate is much better controlled having increased dose of beta-blocker to twice daily.  Otherwise asymptomatic.  On Xarelto for anticoagulation and no bleeding issues.  Plan would be rate control over rhythm control to avoid aggressive management.      Relevant Medications   furosemide (LASIX) 40 MG tablet   Other Relevant Orders   EKG 12-Lead   Stable angina (HCC) (Chronic)    No documentation in the chart to suggest history of ischemic evaluation.  He did not mention any symptoms of angina or significant exertional dyspnea. He did not seem overly interested in ischemic evaluation in the absence of significant symptoms. He does have as needed nitroglycerin written for along with beta-blocker and statin.  Not on aspirin because of Xarelto.      Relevant Medications   furosemide (LASIX) 40 MG tablet   Stroke (cerebrum) (HCC)   Relevant Medications   furosemide (LASIX) 40 MG tablet      Current medicines are reviewed at length with the patient today. (+/- concerns) n/a The following changes have been made: n/a   Patient Instructions  Medication Instructions:  Your physician recommends that you continue on your current medications as directed. Please refer to the Current Medication list given to you today.  Follow-Up: Your physician wants you to follow-up in: September with Dr. Ellyn Hack. You will receive a reminder letter in the mail two months in advance. If you don't receive a letter, please call our office to schedule the follow-up appointment.   Any Other Special Instructions Will Be Listed Below (If Applicable).  --  Continue using your compression stockings   If you need a refill on your cardiac medications before your next appointment, please call your pharmacy.     Studies Ordered:   Orders Placed This Encounter  Procedures  . EKG 12-Lead      Glenetta Hew, M.D., M.S. Interventional Cardiologist   Pager # [phone] Phone #  [phone] [address] [place], [place] [address]

## 2017-04-10 NOTE — Patient Instructions (Signed)
Medication Instructions:  Your physician recommends that you continue on your current medications as directed. Please refer to the Current Medication list given to you today.  Follow-Up: Your physician wants you to follow-up in: September with Dr. Ellyn Hack. You will receive a reminder letter in the mail two months in advance. If you don't receive a letter, please call our office to schedule the follow-up appointment.   Any Other Special Instructions Will Be Listed Below (If Applicable).  --Continue using your compression stockings   If you need a refill on your cardiac medications before your next appointment, please call your pharmacy.

## 2017-04-12 ENCOUNTER — Encounter: Payer: Self-pay | Admitting: Cardiology

## 2017-04-12 NOTE — Assessment & Plan Note (Signed)
Well-controlled on stable dose Lasix.  Not had to take additional dosing.  Also using support stockings and foot elevation.

## 2017-04-12 NOTE — Assessment & Plan Note (Signed)
No documentation in the chart to suggest history of ischemic evaluation.  He did not mention any symptoms of angina or significant exertional dyspnea. He did not seem overly interested in ischemic evaluation in the absence of significant symptoms. He does have as needed nitroglycerin written for along with beta-blocker and statin.  Not on aspirin because of Xarelto.

## 2017-04-12 NOTE — Assessment & Plan Note (Signed)
Remains on atorvastatin.   as of October 2017, was at goal.  I do not have labs since.  Has been monitored by PCP.

## 2017-04-12 NOTE — Assessment & Plan Note (Signed)
No real active heart failure symptoms that I can tell besides some exertional dyspnea when he pushes it.  This is probably as much related to deconditioning.  On exam on current dose of Lasix.  I think the edema is probably more related to venous stasis then CHF.  However continue wearing support stockings.  Continue low-dose beta-blocker.

## 2017-04-12 NOTE — Assessment & Plan Note (Signed)
Rate is much better controlled having increased dose of beta-blocker to twice daily.  Otherwise asymptomatic.  On Xarelto for anticoagulation and no bleeding issues.  Plan would be rate control over rhythm control to avoid aggressive management.

## 2017-04-14 DIAGNOSIS — L404 Guttate psoriasis: Secondary | ICD-10-CM | POA: Diagnosis not present

## 2017-04-14 DIAGNOSIS — D692 Other nonthrombocytopenic purpura: Secondary | ICD-10-CM | POA: Diagnosis not present

## 2017-04-14 DIAGNOSIS — D485 Neoplasm of uncertain behavior of skin: Secondary | ICD-10-CM | POA: Diagnosis not present

## 2017-04-14 DIAGNOSIS — C44329 Squamous cell carcinoma of skin of other parts of face: Secondary | ICD-10-CM | POA: Diagnosis not present

## 2017-04-17 DIAGNOSIS — L97221 Non-pressure chronic ulcer of left calf limited to breakdown of skin: Secondary | ICD-10-CM | POA: Diagnosis not present

## 2017-04-17 DIAGNOSIS — E1165 Type 2 diabetes mellitus with hyperglycemia: Secondary | ICD-10-CM | POA: Diagnosis not present

## 2017-04-17 DIAGNOSIS — I130 Hypertensive heart and chronic kidney disease with heart failure and stage 1 through stage 4 chronic kidney disease, or unspecified chronic kidney disease: Secondary | ICD-10-CM | POA: Diagnosis not present

## 2017-04-17 DIAGNOSIS — L97211 Non-pressure chronic ulcer of right calf limited to breakdown of skin: Secondary | ICD-10-CM | POA: Diagnosis not present

## 2017-04-17 DIAGNOSIS — I878 Other specified disorders of veins: Secondary | ICD-10-CM | POA: Diagnosis not present

## 2017-04-17 DIAGNOSIS — I509 Heart failure, unspecified: Secondary | ICD-10-CM | POA: Diagnosis not present

## 2017-04-27 DIAGNOSIS — H40013 Open angle with borderline findings, low risk, bilateral: Secondary | ICD-10-CM | POA: Diagnosis not present

## 2017-05-04 DIAGNOSIS — L603 Nail dystrophy: Secondary | ICD-10-CM | POA: Diagnosis not present

## 2017-05-04 DIAGNOSIS — I739 Peripheral vascular disease, unspecified: Secondary | ICD-10-CM | POA: Diagnosis not present

## 2017-05-04 DIAGNOSIS — E1042 Type 1 diabetes mellitus with diabetic polyneuropathy: Secondary | ICD-10-CM | POA: Diagnosis not present

## 2017-05-11 DIAGNOSIS — C44329 Squamous cell carcinoma of skin of other parts of face: Secondary | ICD-10-CM | POA: Diagnosis not present

## 2017-05-11 DIAGNOSIS — Z85828 Personal history of other malignant neoplasm of skin: Secondary | ICD-10-CM | POA: Diagnosis not present

## 2017-05-12 DIAGNOSIS — R2689 Other abnormalities of gait and mobility: Secondary | ICD-10-CM | POA: Diagnosis not present

## 2017-05-12 DIAGNOSIS — E1142 Type 2 diabetes mellitus with diabetic polyneuropathy: Secondary | ICD-10-CM | POA: Diagnosis not present

## 2017-05-12 DIAGNOSIS — E785 Hyperlipidemia, unspecified: Secondary | ICD-10-CM | POA: Diagnosis not present

## 2017-05-12 DIAGNOSIS — Z8546 Personal history of malignant neoplasm of prostate: Secondary | ICD-10-CM | POA: Diagnosis not present

## 2017-05-12 DIAGNOSIS — Z794 Long term (current) use of insulin: Secondary | ICD-10-CM | POA: Diagnosis not present

## 2017-05-12 DIAGNOSIS — I110 Hypertensive heart disease with heart failure: Secondary | ICD-10-CM | POA: Diagnosis not present

## 2017-05-12 DIAGNOSIS — Z9181 History of falling: Secondary | ICD-10-CM | POA: Diagnosis not present

## 2017-05-12 DIAGNOSIS — I69398 Other sequelae of cerebral infarction: Secondary | ICD-10-CM | POA: Diagnosis not present

## 2017-05-12 DIAGNOSIS — Z7901 Long term (current) use of anticoagulants: Secondary | ICD-10-CM | POA: Diagnosis not present

## 2017-05-12 DIAGNOSIS — Z85828 Personal history of other malignant neoplasm of skin: Secondary | ICD-10-CM | POA: Diagnosis not present

## 2017-05-12 DIAGNOSIS — I481 Persistent atrial fibrillation: Secondary | ICD-10-CM | POA: Diagnosis not present

## 2017-05-12 DIAGNOSIS — I5032 Chronic diastolic (congestive) heart failure: Secondary | ICD-10-CM | POA: Diagnosis not present

## 2017-05-13 DIAGNOSIS — I481 Persistent atrial fibrillation: Secondary | ICD-10-CM | POA: Diagnosis not present

## 2017-05-13 DIAGNOSIS — I5032 Chronic diastolic (congestive) heart failure: Secondary | ICD-10-CM | POA: Diagnosis not present

## 2017-05-13 DIAGNOSIS — E1142 Type 2 diabetes mellitus with diabetic polyneuropathy: Secondary | ICD-10-CM | POA: Diagnosis not present

## 2017-05-13 DIAGNOSIS — I69398 Other sequelae of cerebral infarction: Secondary | ICD-10-CM | POA: Diagnosis not present

## 2017-05-13 DIAGNOSIS — R2689 Other abnormalities of gait and mobility: Secondary | ICD-10-CM | POA: Diagnosis not present

## 2017-05-13 DIAGNOSIS — I110 Hypertensive heart disease with heart failure: Secondary | ICD-10-CM | POA: Diagnosis not present

## 2017-05-15 DIAGNOSIS — I5032 Chronic diastolic (congestive) heart failure: Secondary | ICD-10-CM | POA: Diagnosis not present

## 2017-05-15 DIAGNOSIS — I69398 Other sequelae of cerebral infarction: Secondary | ICD-10-CM | POA: Diagnosis not present

## 2017-05-15 DIAGNOSIS — E1142 Type 2 diabetes mellitus with diabetic polyneuropathy: Secondary | ICD-10-CM | POA: Diagnosis not present

## 2017-05-15 DIAGNOSIS — I110 Hypertensive heart disease with heart failure: Secondary | ICD-10-CM | POA: Diagnosis not present

## 2017-05-15 DIAGNOSIS — R2689 Other abnormalities of gait and mobility: Secondary | ICD-10-CM | POA: Diagnosis not present

## 2017-05-15 DIAGNOSIS — I481 Persistent atrial fibrillation: Secondary | ICD-10-CM | POA: Diagnosis not present

## 2017-05-19 DIAGNOSIS — I5032 Chronic diastolic (congestive) heart failure: Secondary | ICD-10-CM | POA: Diagnosis not present

## 2017-05-19 DIAGNOSIS — I481 Persistent atrial fibrillation: Secondary | ICD-10-CM | POA: Diagnosis not present

## 2017-05-19 DIAGNOSIS — I69398 Other sequelae of cerebral infarction: Secondary | ICD-10-CM | POA: Diagnosis not present

## 2017-05-19 DIAGNOSIS — I110 Hypertensive heart disease with heart failure: Secondary | ICD-10-CM | POA: Diagnosis not present

## 2017-05-19 DIAGNOSIS — E1142 Type 2 diabetes mellitus with diabetic polyneuropathy: Secondary | ICD-10-CM | POA: Diagnosis not present

## 2017-05-19 DIAGNOSIS — R2689 Other abnormalities of gait and mobility: Secondary | ICD-10-CM | POA: Diagnosis not present

## 2017-05-21 DIAGNOSIS — R2689 Other abnormalities of gait and mobility: Secondary | ICD-10-CM | POA: Diagnosis not present

## 2017-05-21 DIAGNOSIS — E1142 Type 2 diabetes mellitus with diabetic polyneuropathy: Secondary | ICD-10-CM | POA: Diagnosis not present

## 2017-05-21 DIAGNOSIS — I5032 Chronic diastolic (congestive) heart failure: Secondary | ICD-10-CM | POA: Diagnosis not present

## 2017-05-21 DIAGNOSIS — I110 Hypertensive heart disease with heart failure: Secondary | ICD-10-CM | POA: Diagnosis not present

## 2017-05-21 DIAGNOSIS — I69398 Other sequelae of cerebral infarction: Secondary | ICD-10-CM | POA: Diagnosis not present

## 2017-05-21 DIAGNOSIS — I481 Persistent atrial fibrillation: Secondary | ICD-10-CM | POA: Diagnosis not present

## 2017-05-26 DIAGNOSIS — E1142 Type 2 diabetes mellitus with diabetic polyneuropathy: Secondary | ICD-10-CM | POA: Diagnosis not present

## 2017-05-26 DIAGNOSIS — I110 Hypertensive heart disease with heart failure: Secondary | ICD-10-CM | POA: Diagnosis not present

## 2017-05-26 DIAGNOSIS — I5032 Chronic diastolic (congestive) heart failure: Secondary | ICD-10-CM | POA: Diagnosis not present

## 2017-05-26 DIAGNOSIS — I69398 Other sequelae of cerebral infarction: Secondary | ICD-10-CM | POA: Diagnosis not present

## 2017-05-26 DIAGNOSIS — I481 Persistent atrial fibrillation: Secondary | ICD-10-CM | POA: Diagnosis not present

## 2017-05-26 DIAGNOSIS — R2689 Other abnormalities of gait and mobility: Secondary | ICD-10-CM | POA: Diagnosis not present

## 2017-05-28 DIAGNOSIS — R2689 Other abnormalities of gait and mobility: Secondary | ICD-10-CM | POA: Diagnosis not present

## 2017-05-28 DIAGNOSIS — I481 Persistent atrial fibrillation: Secondary | ICD-10-CM | POA: Diagnosis not present

## 2017-05-28 DIAGNOSIS — I110 Hypertensive heart disease with heart failure: Secondary | ICD-10-CM | POA: Diagnosis not present

## 2017-05-28 DIAGNOSIS — I69398 Other sequelae of cerebral infarction: Secondary | ICD-10-CM | POA: Diagnosis not present

## 2017-05-28 DIAGNOSIS — I5032 Chronic diastolic (congestive) heart failure: Secondary | ICD-10-CM | POA: Diagnosis not present

## 2017-05-28 DIAGNOSIS — E1142 Type 2 diabetes mellitus with diabetic polyneuropathy: Secondary | ICD-10-CM | POA: Diagnosis not present

## 2017-06-01 DIAGNOSIS — I5032 Chronic diastolic (congestive) heart failure: Secondary | ICD-10-CM | POA: Diagnosis not present

## 2017-06-01 DIAGNOSIS — I481 Persistent atrial fibrillation: Secondary | ICD-10-CM | POA: Diagnosis not present

## 2017-06-01 DIAGNOSIS — I110 Hypertensive heart disease with heart failure: Secondary | ICD-10-CM | POA: Diagnosis not present

## 2017-06-01 DIAGNOSIS — E1142 Type 2 diabetes mellitus with diabetic polyneuropathy: Secondary | ICD-10-CM | POA: Diagnosis not present

## 2017-06-01 DIAGNOSIS — R2689 Other abnormalities of gait and mobility: Secondary | ICD-10-CM | POA: Diagnosis not present

## 2017-06-01 DIAGNOSIS — I69398 Other sequelae of cerebral infarction: Secondary | ICD-10-CM | POA: Diagnosis not present

## 2017-06-03 DIAGNOSIS — I481 Persistent atrial fibrillation: Secondary | ICD-10-CM | POA: Diagnosis not present

## 2017-06-03 DIAGNOSIS — I5032 Chronic diastolic (congestive) heart failure: Secondary | ICD-10-CM | POA: Diagnosis not present

## 2017-06-03 DIAGNOSIS — I110 Hypertensive heart disease with heart failure: Secondary | ICD-10-CM | POA: Diagnosis not present

## 2017-06-03 DIAGNOSIS — I69398 Other sequelae of cerebral infarction: Secondary | ICD-10-CM | POA: Diagnosis not present

## 2017-06-03 DIAGNOSIS — R2689 Other abnormalities of gait and mobility: Secondary | ICD-10-CM | POA: Diagnosis not present

## 2017-06-03 DIAGNOSIS — E1142 Type 2 diabetes mellitus with diabetic polyneuropathy: Secondary | ICD-10-CM | POA: Diagnosis not present

## 2017-06-08 DIAGNOSIS — I5032 Chronic diastolic (congestive) heart failure: Secondary | ICD-10-CM | POA: Diagnosis not present

## 2017-06-08 DIAGNOSIS — I69398 Other sequelae of cerebral infarction: Secondary | ICD-10-CM | POA: Diagnosis not present

## 2017-06-08 DIAGNOSIS — I110 Hypertensive heart disease with heart failure: Secondary | ICD-10-CM | POA: Diagnosis not present

## 2017-06-08 DIAGNOSIS — I481 Persistent atrial fibrillation: Secondary | ICD-10-CM | POA: Diagnosis not present

## 2017-06-08 DIAGNOSIS — E1142 Type 2 diabetes mellitus with diabetic polyneuropathy: Secondary | ICD-10-CM | POA: Diagnosis not present

## 2017-06-08 DIAGNOSIS — R2689 Other abnormalities of gait and mobility: Secondary | ICD-10-CM | POA: Diagnosis not present

## 2017-06-09 DIAGNOSIS — R2689 Other abnormalities of gait and mobility: Secondary | ICD-10-CM | POA: Diagnosis not present

## 2017-06-09 DIAGNOSIS — I110 Hypertensive heart disease with heart failure: Secondary | ICD-10-CM | POA: Diagnosis not present

## 2017-06-09 DIAGNOSIS — E1142 Type 2 diabetes mellitus with diabetic polyneuropathy: Secondary | ICD-10-CM | POA: Diagnosis not present

## 2017-06-09 DIAGNOSIS — I5032 Chronic diastolic (congestive) heart failure: Secondary | ICD-10-CM | POA: Diagnosis not present

## 2017-06-09 DIAGNOSIS — I69398 Other sequelae of cerebral infarction: Secondary | ICD-10-CM | POA: Diagnosis not present

## 2017-06-09 DIAGNOSIS — I481 Persistent atrial fibrillation: Secondary | ICD-10-CM | POA: Diagnosis not present

## 2017-06-11 DIAGNOSIS — I481 Persistent atrial fibrillation: Secondary | ICD-10-CM | POA: Diagnosis not present

## 2017-06-11 DIAGNOSIS — R2689 Other abnormalities of gait and mobility: Secondary | ICD-10-CM | POA: Diagnosis not present

## 2017-06-11 DIAGNOSIS — I5032 Chronic diastolic (congestive) heart failure: Secondary | ICD-10-CM | POA: Diagnosis not present

## 2017-06-11 DIAGNOSIS — I69398 Other sequelae of cerebral infarction: Secondary | ICD-10-CM | POA: Diagnosis not present

## 2017-06-11 DIAGNOSIS — I110 Hypertensive heart disease with heart failure: Secondary | ICD-10-CM | POA: Diagnosis not present

## 2017-06-11 DIAGNOSIS — E1142 Type 2 diabetes mellitus with diabetic polyneuropathy: Secondary | ICD-10-CM | POA: Diagnosis not present

## 2017-06-18 DIAGNOSIS — I69398 Other sequelae of cerebral infarction: Secondary | ICD-10-CM | POA: Diagnosis not present

## 2017-06-18 DIAGNOSIS — I110 Hypertensive heart disease with heart failure: Secondary | ICD-10-CM | POA: Diagnosis not present

## 2017-06-18 DIAGNOSIS — I5032 Chronic diastolic (congestive) heart failure: Secondary | ICD-10-CM | POA: Diagnosis not present

## 2017-06-18 DIAGNOSIS — E1142 Type 2 diabetes mellitus with diabetic polyneuropathy: Secondary | ICD-10-CM | POA: Diagnosis not present

## 2017-06-18 DIAGNOSIS — R2689 Other abnormalities of gait and mobility: Secondary | ICD-10-CM | POA: Diagnosis not present

## 2017-06-18 DIAGNOSIS — I481 Persistent atrial fibrillation: Secondary | ICD-10-CM | POA: Diagnosis not present

## 2017-06-23 DIAGNOSIS — I110 Hypertensive heart disease with heart failure: Secondary | ICD-10-CM | POA: Diagnosis not present

## 2017-06-23 DIAGNOSIS — I5032 Chronic diastolic (congestive) heart failure: Secondary | ICD-10-CM | POA: Diagnosis not present

## 2017-06-23 DIAGNOSIS — I69398 Other sequelae of cerebral infarction: Secondary | ICD-10-CM | POA: Diagnosis not present

## 2017-06-23 DIAGNOSIS — E1142 Type 2 diabetes mellitus with diabetic polyneuropathy: Secondary | ICD-10-CM | POA: Diagnosis not present

## 2017-06-23 DIAGNOSIS — I481 Persistent atrial fibrillation: Secondary | ICD-10-CM | POA: Diagnosis not present

## 2017-06-23 DIAGNOSIS — R2689 Other abnormalities of gait and mobility: Secondary | ICD-10-CM | POA: Diagnosis not present

## 2017-06-29 DIAGNOSIS — I639 Cerebral infarction, unspecified: Secondary | ICD-10-CM | POA: Diagnosis not present

## 2017-06-29 DIAGNOSIS — I480 Paroxysmal atrial fibrillation: Secondary | ICD-10-CM | POA: Diagnosis not present

## 2017-06-29 DIAGNOSIS — Z125 Encounter for screening for malignant neoplasm of prostate: Secondary | ICD-10-CM | POA: Diagnosis not present

## 2017-06-29 DIAGNOSIS — I10 Essential (primary) hypertension: Secondary | ICD-10-CM | POA: Diagnosis not present

## 2017-06-29 DIAGNOSIS — R808 Other proteinuria: Secondary | ICD-10-CM | POA: Diagnosis not present

## 2017-06-29 DIAGNOSIS — Z7901 Long term (current) use of anticoagulants: Secondary | ICD-10-CM | POA: Diagnosis not present

## 2017-06-29 DIAGNOSIS — Z6831 Body mass index (BMI) 31.0-31.9, adult: Secondary | ICD-10-CM | POA: Diagnosis not present

## 2017-06-29 DIAGNOSIS — N182 Chronic kidney disease, stage 2 (mild): Secondary | ICD-10-CM | POA: Diagnosis not present

## 2017-06-29 DIAGNOSIS — E7800 Pure hypercholesterolemia, unspecified: Secondary | ICD-10-CM | POA: Diagnosis not present

## 2017-06-29 DIAGNOSIS — I509 Heart failure, unspecified: Secondary | ICD-10-CM | POA: Diagnosis not present

## 2017-06-29 DIAGNOSIS — I129 Hypertensive chronic kidney disease with stage 1 through stage 4 chronic kidney disease, or unspecified chronic kidney disease: Secondary | ICD-10-CM | POA: Diagnosis not present

## 2017-06-29 DIAGNOSIS — E1129 Type 2 diabetes mellitus with other diabetic kidney complication: Secondary | ICD-10-CM | POA: Diagnosis not present

## 2017-06-30 DIAGNOSIS — I110 Hypertensive heart disease with heart failure: Secondary | ICD-10-CM | POA: Diagnosis not present

## 2017-06-30 DIAGNOSIS — E1142 Type 2 diabetes mellitus with diabetic polyneuropathy: Secondary | ICD-10-CM | POA: Diagnosis not present

## 2017-06-30 DIAGNOSIS — I5032 Chronic diastolic (congestive) heart failure: Secondary | ICD-10-CM | POA: Diagnosis not present

## 2017-06-30 DIAGNOSIS — I69398 Other sequelae of cerebral infarction: Secondary | ICD-10-CM | POA: Diagnosis not present

## 2017-06-30 DIAGNOSIS — R2689 Other abnormalities of gait and mobility: Secondary | ICD-10-CM | POA: Diagnosis not present

## 2017-06-30 DIAGNOSIS — I481 Persistent atrial fibrillation: Secondary | ICD-10-CM | POA: Diagnosis not present

## 2017-07-07 DIAGNOSIS — E1142 Type 2 diabetes mellitus with diabetic polyneuropathy: Secondary | ICD-10-CM | POA: Diagnosis not present

## 2017-07-07 DIAGNOSIS — I69398 Other sequelae of cerebral infarction: Secondary | ICD-10-CM | POA: Diagnosis not present

## 2017-07-07 DIAGNOSIS — I5032 Chronic diastolic (congestive) heart failure: Secondary | ICD-10-CM | POA: Diagnosis not present

## 2017-07-07 DIAGNOSIS — I481 Persistent atrial fibrillation: Secondary | ICD-10-CM | POA: Diagnosis not present

## 2017-07-07 DIAGNOSIS — R2689 Other abnormalities of gait and mobility: Secondary | ICD-10-CM | POA: Diagnosis not present

## 2017-07-07 DIAGNOSIS — I110 Hypertensive heart disease with heart failure: Secondary | ICD-10-CM | POA: Diagnosis not present

## 2017-07-10 DIAGNOSIS — E113313 Type 2 diabetes mellitus with moderate nonproliferative diabetic retinopathy with macular edema, bilateral: Secondary | ICD-10-CM | POA: Diagnosis not present

## 2017-07-10 DIAGNOSIS — H35041 Retinal micro-aneurysms, unspecified, right eye: Secondary | ICD-10-CM | POA: Diagnosis not present

## 2017-07-10 DIAGNOSIS — H43813 Vitreous degeneration, bilateral: Secondary | ICD-10-CM | POA: Diagnosis not present

## 2017-07-10 DIAGNOSIS — H35423 Microcystoid degeneration of retina, bilateral: Secondary | ICD-10-CM | POA: Diagnosis not present

## 2017-08-17 DIAGNOSIS — E1151 Type 2 diabetes mellitus with diabetic peripheral angiopathy without gangrene: Secondary | ICD-10-CM | POA: Diagnosis not present

## 2017-08-17 DIAGNOSIS — I739 Peripheral vascular disease, unspecified: Secondary | ICD-10-CM | POA: Diagnosis not present

## 2017-08-17 DIAGNOSIS — L603 Nail dystrophy: Secondary | ICD-10-CM | POA: Diagnosis not present

## 2017-08-31 DIAGNOSIS — L821 Other seborrheic keratosis: Secondary | ICD-10-CM | POA: Diagnosis not present

## 2017-08-31 DIAGNOSIS — D1801 Hemangioma of skin and subcutaneous tissue: Secondary | ICD-10-CM | POA: Diagnosis not present

## 2017-08-31 DIAGNOSIS — L570 Actinic keratosis: Secondary | ICD-10-CM | POA: Diagnosis not present

## 2017-08-31 DIAGNOSIS — L700 Acne vulgaris: Secondary | ICD-10-CM | POA: Diagnosis not present

## 2017-08-31 DIAGNOSIS — Z85828 Personal history of other malignant neoplasm of skin: Secondary | ICD-10-CM | POA: Diagnosis not present

## 2017-08-31 DIAGNOSIS — L820 Inflamed seborrheic keratosis: Secondary | ICD-10-CM | POA: Diagnosis not present

## 2017-09-21 DIAGNOSIS — I480 Paroxysmal atrial fibrillation: Secondary | ICD-10-CM | POA: Diagnosis not present

## 2017-09-21 DIAGNOSIS — E7800 Pure hypercholesterolemia, unspecified: Secondary | ICD-10-CM | POA: Diagnosis not present

## 2017-09-21 DIAGNOSIS — N182 Chronic kidney disease, stage 2 (mild): Secondary | ICD-10-CM | POA: Diagnosis not present

## 2017-09-21 DIAGNOSIS — I509 Heart failure, unspecified: Secondary | ICD-10-CM | POA: Diagnosis not present

## 2017-09-21 DIAGNOSIS — E1129 Type 2 diabetes mellitus with other diabetic kidney complication: Secondary | ICD-10-CM | POA: Diagnosis not present

## 2017-09-21 DIAGNOSIS — I10 Essential (primary) hypertension: Secondary | ICD-10-CM | POA: Diagnosis not present

## 2017-09-21 DIAGNOSIS — R808 Other proteinuria: Secondary | ICD-10-CM | POA: Diagnosis not present

## 2017-09-21 DIAGNOSIS — Z7901 Long term (current) use of anticoagulants: Secondary | ICD-10-CM | POA: Diagnosis not present

## 2017-09-21 DIAGNOSIS — Z6831 Body mass index (BMI) 31.0-31.9, adult: Secondary | ICD-10-CM | POA: Diagnosis not present

## 2017-09-21 DIAGNOSIS — E1140 Type 2 diabetes mellitus with diabetic neuropathy, unspecified: Secondary | ICD-10-CM | POA: Diagnosis not present

## 2017-09-21 DIAGNOSIS — E668 Other obesity: Secondary | ICD-10-CM | POA: Diagnosis not present

## 2017-10-16 ENCOUNTER — Encounter (HOSPITAL_COMMUNITY): Payer: Self-pay | Admitting: Emergency Medicine

## 2017-10-16 ENCOUNTER — Emergency Department (HOSPITAL_COMMUNITY)
Admission: EM | Admit: 2017-10-16 | Discharge: 2017-10-16 | Disposition: A | Discharge status: Home / Self Care | Payer: Medicare Other | Attending: Emergency Medicine | Admitting: Emergency Medicine

## 2017-10-16 ENCOUNTER — Emergency Department (HOSPITAL_COMMUNITY): Payer: Medicare Other

## 2017-10-16 DIAGNOSIS — W06XXXA Fall from bed, initial encounter: Secondary | ICD-10-CM | POA: Insufficient documentation

## 2017-10-16 DIAGNOSIS — Z8546 Personal history of malignant neoplasm of prostate: Secondary | ICD-10-CM | POA: Diagnosis not present

## 2017-10-16 DIAGNOSIS — E1140 Type 2 diabetes mellitus with diabetic neuropathy, unspecified: Secondary | ICD-10-CM | POA: Insufficient documentation

## 2017-10-16 DIAGNOSIS — Y9384 Activity, sleeping: Secondary | ICD-10-CM | POA: Diagnosis not present

## 2017-10-16 DIAGNOSIS — Z79899 Other long term (current) drug therapy: Secondary | ICD-10-CM | POA: Insufficient documentation

## 2017-10-16 DIAGNOSIS — Y92013 Bedroom of single-family (private) house as the place of occurrence of the external cause: Secondary | ICD-10-CM | POA: Insufficient documentation

## 2017-10-16 DIAGNOSIS — Z8673 Personal history of transient ischemic attack (TIA), and cerebral infarction without residual deficits: Secondary | ICD-10-CM | POA: Insufficient documentation

## 2017-10-16 DIAGNOSIS — Z923 Personal history of irradiation: Secondary | ICD-10-CM | POA: Insufficient documentation

## 2017-10-16 DIAGNOSIS — Z7901 Long term (current) use of anticoagulants: Secondary | ICD-10-CM | POA: Diagnosis not present

## 2017-10-16 DIAGNOSIS — D68318 Other hemorrhagic disorder due to intrinsic circulating anticoagulants, antibodies, or inhibitors: Secondary | ICD-10-CM | POA: Diagnosis not present

## 2017-10-16 DIAGNOSIS — S00432A Contusion of left ear, initial encounter: Secondary | ICD-10-CM | POA: Diagnosis not present

## 2017-10-16 DIAGNOSIS — S0990XA Unspecified injury of head, initial encounter: Secondary | ICD-10-CM | POA: Diagnosis not present

## 2017-10-16 DIAGNOSIS — Y998 Other external cause status: Secondary | ICD-10-CM | POA: Insufficient documentation

## 2017-10-16 DIAGNOSIS — S01312A Laceration without foreign body of left ear, initial encounter: Secondary | ICD-10-CM | POA: Diagnosis not present

## 2017-10-16 DIAGNOSIS — S00431A Contusion of right ear, initial encounter: Secondary | ICD-10-CM | POA: Insufficient documentation

## 2017-10-16 DIAGNOSIS — S01311A Laceration without foreign body of right ear, initial encounter: Secondary | ICD-10-CM | POA: Diagnosis not present

## 2017-10-16 DIAGNOSIS — R0902 Hypoxemia: Secondary | ICD-10-CM | POA: Diagnosis not present

## 2017-10-16 DIAGNOSIS — W19XXXA Unspecified fall, initial encounter: Secondary | ICD-10-CM | POA: Diagnosis not present

## 2017-10-16 DIAGNOSIS — Z794 Long term (current) use of insulin: Secondary | ICD-10-CM | POA: Diagnosis not present

## 2017-10-16 DIAGNOSIS — I482 Chronic atrial fibrillation: Secondary | ICD-10-CM | POA: Diagnosis not present

## 2017-10-16 LAB — CBG MONITORING, ED: GLUCOSE-CAPILLARY: 101 mg/dL — AB (ref 70–99)

## 2017-10-16 MED ORDER — ACETAMINOPHEN 500 MG PO TABS: 1000.0000 mg | ORAL_TABLET | Freq: Four times a day (QID) | ORAL | 0 refills | Status: DC | PRN

## 2017-10-16 MED ORDER — LIDOCAINE-EPINEPHRINE (PF) 2 %-1:200000 IJ SOLN: 10.0000 mL | Freq: Once | INTRAMUSCULAR | Status: DC

## 2017-10-16 MED ORDER — ACETAMINOPHEN 500 MG PO TABS
1000.0000 mg | ORAL_TABLET | Freq: Once | ORAL | Status: AC
  Administered 2017-10-16: 1000 mg via ORAL
  Filled 2017-10-16: qty 2

## 2017-10-16 MED ORDER — LIDOCAINE HCL (PF) 1 % IJ SOLN: 5.0000 mL | Freq: Once | INTRAMUSCULAR | Status: DC

## 2017-10-16 MED ORDER — LIDOCAINE-EPINEPHRINE (PF) 2 %-1:200000 IJ SOLN
10.0000 mL | Freq: Once | INTRAMUSCULAR | Status: AC
  Administered 2017-10-16: 10 mL via INTRADERMAL
  Filled 2017-10-16: qty 20

## 2017-10-16 MED ORDER — LIDOCAINE HCL (PF) 1 % IJ SOLN
5.0000 mL | Freq: Once | INTRAMUSCULAR | Status: AC
  Administered 2017-10-16: 5 mL
  Filled 2017-10-16: qty 5

## 2017-10-16 NOTE — ED Triage Notes (Signed)
Pt fell out of bed around 6 am hitting his head on his nightstand , pt is on blood thinners , no loc , bleeding is controlled

## 2017-10-16 NOTE — ED Notes (Signed)
ED Provider at bedside. 

## 2017-10-16 NOTE — Discharge Instructions (Addendum)
Keep dressing on, and keep it dry.

## 2017-10-16 NOTE — ED Notes (Signed)
CBG taken at bedside. CBG was 101.

## 2017-10-16 NOTE — Consult Note (Signed)
Reason for Consult: Ear injury Referring Physician: Charlesetta Shanks, MD  Charles Friedman is an 84 y.o. male.  HPI: Earlier this morning he fell out of bed and hit his right ear.  He has significant bleeding and was brought to the emergency department.  He is on Xarelto for atrial fibrillation.  No other injuries sustained.  CT scan was negative for any fractures.  Past Medical History:  Diagnosis Date  . Atrial fibrillation ([place]) 2003   Initially on Coumadin, now on Xarelto  . Cataract    Bilateral cataracts.  . Diabetes mellitus without complication ([place])    type II  . Diabetic peripheral neuropathy associated with type 2 diabetes mellitus ([place])   . Glaucoma    left eye  . Hx of radiation therapy 08/15/13- 10/06/13   prostate 7600 cGy 38 sessions  . Hyperlipidemia with target LDL less than 70   . Neuropathy    diabetic, in feet  . Personal history of fall 09/26/13   front entrance of [place]  . Prostate cancer [facility])    History of radiation therapy    Past Surgical History:  Procedure Laterality Date  . CAROTID DOPPLERS  11/2015   Mild plaque bilaterally.  Less than 40%.  Antegrade vertebral flow.  Marland Kitchen PROSTATE BIOPSY  12/25/11   Adenocarcinoma, gleason 6  . TRANSTHORACIC ECHOCARDIOGRAM  11/2015   In setting of CVA: Technically difficult.  Normal LV size and function.  EF 55-60%.  No regional wall motion normality.  Moderate LA dilation, mild RA dilation.  Mildly increased PA pressures.    Family History  Problem Relation Age of Onset  . Cancer Sister 72       brain tumor died at age 81  . Heart disease Mother        Marena Chancy of specifics  . Heart attack Father 30       sudden cardiac death    Social History:  reports that he has never smoked. He has never used smokeless tobacco. He reports that he does not drink alcohol or use drugs.  Allergies:  Allergies  Allergen Reactions  . Aureomycin [Chlortetracycline] Itching and Rash    Reaction approximately 1956    Medications:  Reviewed  Results for orders placed or performed during the hospital encounter of 10/16/17 (from the past 48 hour(s))  CBG monitoring, ED     Status: Abnormal   Collection Time: 10/16/17  8:53 AM  Result Value Ref Range   Glucose-Capillary 101 (H) 70 - 99 mg/dL    Ct Head Wo Contrast  Result Date: 10/16/2017 CLINICAL DATA:  Fall from bed at 6 a.m. with head trauma. EXAM: CT HEAD WITHOUT CONTRAST TECHNIQUE: Contiguous axial images were obtained from the base of the skull through the vertex without intravenous contrast. COMPARISON:  11/25/2015 FINDINGS: Brain: No evidence of acute infarction, hemorrhage, hydrocephalus, extra-axial collection or mass lesion/mass effect. Generalized atrophy, moderate. Mild or moderate chronic small vessel ischemia in the cerebral white matter. Small remote left frontal cortex infarct seen laterally. Vascular: Atherosclerotic calcification.  No hyperdense vessel Skull: There is an ear laceration with bleeding and gas on the right. Negative for fracture. Sinuses/Orbits: No evidence of injury. Bilateral cataract resection. IMPRESSION: 1. Right ear laceration and bleeding. 2. No evidence of intracranial injury. Electronically Signed   By: Monte Fantasia M.D.   On: 10/16/2017 10:03    OXB:[id number] except as listed in admit H&P  Blood pressure (!) 155/87, pulse 90, temperature 97.7 F (36.5 C), temperature  source Oral, resp. rate 19, height 6\' 1"  (1.854 m), weight 108.9 kg, SpO2 95 %.  PHYSICAL EXAM: Overall appearance:  Healthy appearing, in no distress Head:  Normocephalic, atraumatic, except for the right ear. Ears: Left external ear looks normal.  Right side with a relatively small hematoma of the upper half of the antihelix and scaphoid area.  There is a small laceration in the skin fold inferiorly and another one adjacent to where there is a slight venous ooze.  There is no loss of skin and there is no open wound. Nose: External nose is healthy in appearance.  Internal nasal exam free of any lesions or obstruction. Oral Cavity/Pharynx:  There are no mucosal lesions or masses identified. Larynx/Hypopharynx: Deferred Neuro:  No identifiable neurologic deficits. Neck: No palpable neck masses.  Studies Reviewed: none  Procedures: Right ear was cleaned of fresh and dried blood.  Xeroform is bunched up and applied inside the auricle and then a mastoid dressing was applied.   Assessment/Plan: Small auricular lacerations with small auricular hematoma.  Continuous slow oozing due to the anticoagulation.  Recommend a Glasscock mastoid dressing for compression and we will leave that on for about 5 days.  He will follow-up in the office next week and if the hematoma is significant then we can drain it at that time.  If it is very small or resolves then it will not require any further treatment.  Izora Gala 10/16/2017, 11:50 AM

## 2017-10-16 NOTE — ED Provider Notes (Signed)
[facility] EMERGENCY DEPARTMENT Provider Note   CSN: [id number] Arrival date & time: 10/16/17  9509     History   Chief Complaint Chief Complaint  Patient presents with  . Fall  . Head Laceration    HPI Charles Friedman is a 84 y.o. male.  HPI Patient rolled out of bed this morning and struck his ear on the wooden part of the bed frame.  He does take Xarelto.  Patient denies that he was knocked out.  He did get a cut in injury to his ear and that continued to bleed.  Happened about 6 AM.  Patient has peripheral neuropathy of the feet.  He is deconditioned but independent.  He is not able to get himself back up off the floor once he falls.  He had to call family members.  Once there, they observed a large pool of blood and bleeding from his ear or the side of his head. E MS called for transport.  Patient denies that he has any pain.  No confusion or behavior change. Past Medical History:  Diagnosis Date  . Atrial fibrillation ([place]) 2003   Initially on Coumadin, now on Xarelto  . Cataract    Bilateral cataracts.  . Diabetes mellitus without complication ([place])    type II  . Diabetic peripheral neuropathy associated with type 2 diabetes mellitus ([place])   . Glaucoma    left eye  . Hx of radiation therapy 08/15/13- 10/06/13   prostate 7600 cGy 38 sessions  . Hyperlipidemia with target LDL less than 70   . Neuropathy    diabetic, in feet  . Personal history of fall 09/26/13   front entrance of [place]  . Prostate cancer [facility])    History of radiation therapy    Patient Active Problem List   Diagnosis Date Noted  . Bilateral edema of lower extremity 04/10/2017  . Chronic diastolic heart failure, NYHA class 2 ([place]) 04/10/2017  . Stable angina ([place]) 01/14/2017  . Hyperlipidemia with target LDL less than 100 01/22/2016  . Abnormality of gait 01/22/2016  . History of CVA (cerebrovascular accident) 11/26/2015  . TIA (transient ischemic attack) 11/25/2015  . Cerebral  infarction ([place]) 11/25/2015  . Stroke (cerebrum) ([place]) 11/25/2015  . Type 2 diabetes mellitus with hyperglycemia ([place]) 07/15/2014  . UTI (lower urinary tract infection) 07/15/2014  . Fall at home 07/15/2014  . Closed rib fracture 07/15/2014  . Dehydration, mild 07/15/2014  . Diabetes mellitus without complication ([place])   . Hx of radiation therapy   . Persistent atrial fibrillation ([place])   . Prostate cancer ([place]) 01/29/2012    Past Surgical History:  Procedure Laterality Date  . CAROTID DOPPLERS  11/2015   Mild plaque bilaterally.  Less than 40%.  Antegrade vertebral flow.  Marland Kitchen PROSTATE BIOPSY  12/25/11   Adenocarcinoma, gleason 6  . TRANSTHORACIC ECHOCARDIOGRAM  11/2015   In setting of CVA: Technically difficult.  Normal LV size and function.  EF 55-60%.  No regional wall motion normality.  Moderate LA dilation, mild RA dilation.  Mildly increased PA pressures.        Home Medications    Prior to Admission medications   Medication Sig Start Date End Date Taking? Authorizing Provider  atorvastatin (LIPITOR) 40 MG tablet Take 1 tablet (40 mg total) by mouth daily at 6 PM. 11/27/15  Yes Vann, Jessica U, DO  escitalopram (LEXAPRO) 5 MG tablet Take 5 mg by mouth daily.   Yes [provider]  furosemide (LASIX) 40 MG tablet Take 1 tablet by mouth daily. 04/02/17  Yes [provider]  insulin glargine (LANTUS) 100 UNIT/ML injection Inject 0.42 mLs (42 Units total) into the skin at bedtime. Patient taking differently: Inject 35 Units into the skin daily.  11/27/15  Yes Geradine Girt, DO  Insulin Lispro (HUMALOG KWIKPEN [place]) Inject 5 Units into the skin 3 (three) times daily.    Yes [provider]  LYRICA 75 MG capsule 75 mg 2 (two) times daily.  02/09/17  Yes [provider]  metoprolol tartrate (LOPRESSOR) 25 MG tablet Take one-half by mouth twice daily. May take additional one-half tab daily as needed. Patient taking differently: Take 12.5 mg by mouth  2 (two) times daily. May take additional one-half tab daily as needed. 02/20/17  Yes Leonie Man, MD  nitroGLYCERIN (NITROSTAT) 0.4 MG SL tablet Place 1 tablet (0.4 mg total) under the tongue every 5 (five) minutes as needed for chest pain. 01/14/17 10/16/17 Yes Leonie Man, MD  rivaroxaban (XARELTO) 20 MG TABS tablet Take 1 tablet (20 mg total) by mouth daily with supper. 11/27/15  Yes Eulogio Bear U, DO  tamsulosin (FLOMAX) 0.4 MG CAPS capsule Take 0.4 mg by mouth daily. 10/21/15  Yes [provider]  acetaminophen (TYLENOL) 500 MG tablet Take 2 tablets (1,000 mg total) by mouth every 6 (six) hours as needed. 10/16/17   Charlesetta Shanks, MD    Family History Family History  Problem Relation Age of Onset  . Cancer Sister 17       brain tumor died at age 92  . Heart disease Mother        Marena Chancy of specifics  . Heart attack Father 29       sudden cardiac death    Social History Social History   Tobacco Use  . Smoking status: Never Smoker  . Smokeless tobacco: Never Used  Substance Use Topics  . Alcohol use: No  . Drug use: No     Allergies   Aureomycin [chlortetracycline]   Review of Systems Review of Systems 10 Systems reviewed and are negative for acute change except as noted in the HPI.   Physical Exam Updated Vital Signs BP 131/67   Pulse 71   Temp 97.7 F (36.5 C) (Oral)   Resp 19   Ht 6\' 1"  (1.854 m)   Wt 108.9 kg   SpO2 99%   BMI 31.66 kg/m   Physical Exam  Constitutional: He is oriented to person, place, and time. No distress.  Patient is alert and in no distress.  No respiratory distress.  He is cheerful and interactive.  Central obesity and general physical deconditioning.  HENT:  Nose: Nose normal.  Patient has laceration to the right pinna.  This is photo documented.  There is a moderate sized hematoma with the thickness of the pinna being approximately 1-1/4 cm.  There is a horizontal laceration approximately 1.5 to 2 cm and a vertical  laceration approximately 1 cm.  No scalp lacerations.  Eyes: Pupils are equal, round, and reactive to light. EOM are normal.  Neck: Neck supple.  Cardiovascular: Normal rate, regular rhythm, normal heart sounds and intact distal pulses.  Pulmonary/Chest: Effort normal and breath sounds normal. He exhibits no tenderness.  Abdominal: Soft. He exhibits no distension. There is no tenderness. There is no guarding.  Musculoskeletal: Normal range of motion. He exhibits no edema or tenderness.  Patient has peripheral neuropathy.  He has chronic thinning of  the skin of the lower extremities.  One very minor abrasion to the second right toe.  Neurological: He is alert and oriented to person, place, and time. He exhibits normal muscle tone. Coordination normal.  Skin: Skin is warm and dry.  Psychiatric: He has a normal mood and affect.             ED Treatments / Results  Labs (all labs ordered are listed, but only abnormal results are displayed) Labs Reviewed  CBG MONITORING, ED - Abnormal; Notable for the following components:      Result Value   Glucose-Capillary 101 (*)    All other components within normal limits    EKG None  Radiology Ct Head Wo Contrast  Result Date: 10/16/2017 CLINICAL DATA:  Fall from bed at 6 a.m. with head trauma. EXAM: CT HEAD WITHOUT CONTRAST TECHNIQUE: Contiguous axial images were obtained from the base of the skull through the vertex without intravenous contrast. COMPARISON:  11/25/2015 FINDINGS: Brain: No evidence of acute infarction, hemorrhage, hydrocephalus, extra-axial collection or mass lesion/mass effect. Generalized atrophy, moderate. Mild or moderate chronic small vessel ischemia in the cerebral white matter. Small remote left frontal cortex infarct seen laterally. Vascular: Atherosclerotic calcification.  No hyperdense vessel Skull: There is an ear laceration with bleeding and gas on the right. Negative for fracture. Sinuses/Orbits: No evidence of  injury. Bilateral cataract resection. IMPRESSION: 1. Right ear laceration and bleeding. 2. No evidence of intracranial injury. Electronically Signed   By: Monte Fantasia M.D.   On: 10/16/2017 10:03    Procedures Procedures (including critical care time) Pinna cleaned and irrigated .  All blood removed and wound examined.  Blood cleaned from scalp, no scalp laceration present.  Extent of laceration examined and explored.  See attached images.  Wound subsequently dressed by myself with wet-to-dry compression dressing and [place], awaiting consultation for ENT, Dr. Constance Holster in the ED. Medications Ordered in ED Medications  lidocaine (PF) (XYLOCAINE) 1 % injection 5 mL (has no administration in time range)  lidocaine-EPINEPHrine (XYLOCAINE W/EPI) 2 %-1:200000 (PF) injection 10 mL (has no administration in time range)  lidocaine (PF) (XYLOCAINE) 1 % injection 5 mL (5 mLs Infiltration Given 10/16/17 0958)  lidocaine-EPINEPHrine (XYLOCAINE W/EPI) 2 %-1:200000 (PF) injection 10 mL (10 mLs Intradermal Given 10/16/17 0958)  acetaminophen (TYLENOL) tablet 1,000 mg (1,000 mg Oral Given 10/16/17 1055)     Initial Impression / Assessment and Plan / ED Course  I have reviewed the triage vital signs and the nursing notes.  Pertinent labs & imaging results that were available during my care of the patient were reviewed by me and considered in my medical decision making (see chart for details).    30 minutes after consult order entered, confirmed that is not yet called.  Have requested to be paged out.  Consult: Dr. Constance Holster will evaluate the patient in the emergency department.  Has done consultation and applied specialty [place] dressing. Final Clinical Impressions(s) / ED Diagnoses   Final diagnoses:  Injury of head, initial encounter  Laceration of right pinna, initial encounter  Hematoma auricle/pinna, right, initial encounter  Anticoagulated   Patient fell as outlined above.  CT shows no intracranial  injury.  His mental status is normal.  He does not complain of headache.  Complex ear laceration and hematoma with patient on Xarelto.  Managed as outlined.  Patient discharged in good condition without complaints.  Family members are present and return precautions reviewed. ED Discharge Orders  Ordered    acetaminophen (TYLENOL) 500 MG tablet  Every 6 hours PRN     10/16/17 1304           Charlesetta Shanks, MD 10/16/17 1317

## 2017-10-19 ENCOUNTER — Inpatient Hospital Stay (HOSPITAL_COMMUNITY)
Admission: EM | Admit: 2017-10-19 | Discharge: 2017-10-23 | DRG: 293 | Disposition: A | Discharge status: Skilled Nursing Facility | Payer: Medicare Other | Attending: Internal Medicine | Admitting: Internal Medicine

## 2017-10-19 DIAGNOSIS — E119 Type 2 diabetes mellitus without complications: Secondary | ICD-10-CM

## 2017-10-19 DIAGNOSIS — Z8546 Personal history of malignant neoplasm of prostate: Secondary | ICD-10-CM

## 2017-10-19 DIAGNOSIS — R0902 Hypoxemia: Secondary | ICD-10-CM | POA: Diagnosis present

## 2017-10-19 DIAGNOSIS — I4819 Other persistent atrial fibrillation: Secondary | ICD-10-CM | POA: Diagnosis present

## 2017-10-19 DIAGNOSIS — Z9114 Patient's other noncompliance with medication regimen: Secondary | ICD-10-CM

## 2017-10-19 DIAGNOSIS — I5033 Acute on chronic diastolic (congestive) heart failure: Secondary | ICD-10-CM | POA: Diagnosis not present

## 2017-10-19 DIAGNOSIS — Z923 Personal history of irradiation: Secondary | ICD-10-CM

## 2017-10-19 DIAGNOSIS — S81812A Laceration without foreign body, left lower leg, initial encounter: Secondary | ICD-10-CM | POA: Diagnosis present

## 2017-10-19 DIAGNOSIS — N289 Disorder of kidney and ureter, unspecified: Secondary | ICD-10-CM

## 2017-10-19 DIAGNOSIS — Z8673 Personal history of transient ischemic attack (TIA), and cerebral infarction without residual deficits: Secondary | ICD-10-CM

## 2017-10-19 DIAGNOSIS — R531 Weakness: Secondary | ICD-10-CM

## 2017-10-19 DIAGNOSIS — R062 Wheezing: Secondary | ICD-10-CM | POA: Diagnosis not present

## 2017-10-19 DIAGNOSIS — Z794 Long term (current) use of insulin: Secondary | ICD-10-CM

## 2017-10-19 DIAGNOSIS — J811 Chronic pulmonary edema: Secondary | ICD-10-CM | POA: Diagnosis not present

## 2017-10-19 DIAGNOSIS — W19XXXA Unspecified fall, initial encounter: Secondary | ICD-10-CM

## 2017-10-19 DIAGNOSIS — S0990XA Unspecified injury of head, initial encounter: Secondary | ICD-10-CM | POA: Diagnosis not present

## 2017-10-19 DIAGNOSIS — N189 Chronic kidney disease, unspecified: Secondary | ICD-10-CM | POA: Diagnosis present

## 2017-10-19 DIAGNOSIS — R0602 Shortness of breath: Secondary | ICD-10-CM | POA: Diagnosis not present

## 2017-10-19 DIAGNOSIS — I213 ST elevation (STEMI) myocardial infarction of unspecified site: Secondary | ICD-10-CM | POA: Diagnosis not present

## 2017-10-19 DIAGNOSIS — I4891 Unspecified atrial fibrillation: Secondary | ICD-10-CM | POA: Diagnosis not present

## 2017-10-19 DIAGNOSIS — Z881 Allergy status to other antibiotic agents status: Secondary | ICD-10-CM

## 2017-10-19 DIAGNOSIS — E1142 Type 2 diabetes mellitus with diabetic polyneuropathy: Secondary | ICD-10-CM | POA: Diagnosis not present

## 2017-10-19 DIAGNOSIS — Z79899 Other long term (current) drug therapy: Secondary | ICD-10-CM

## 2017-10-19 DIAGNOSIS — E1122 Type 2 diabetes mellitus with diabetic chronic kidney disease: Secondary | ICD-10-CM | POA: Diagnosis present

## 2017-10-19 DIAGNOSIS — E785 Hyperlipidemia, unspecified: Secondary | ICD-10-CM | POA: Diagnosis present

## 2017-10-19 DIAGNOSIS — R296 Repeated falls: Secondary | ICD-10-CM

## 2017-10-19 DIAGNOSIS — R5381 Other malaise: Secondary | ICD-10-CM | POA: Diagnosis present

## 2017-10-19 NOTE — ED Triage Notes (Signed)
Pt BIB [facility] after falling x 2 today. Pt c/o generalized weakness. Also c/o shortness of breath, EMS noted wheezing, given 5mg  albuterol with improvement.

## 2017-10-20 ENCOUNTER — Encounter (HOSPITAL_COMMUNITY): Payer: Self-pay | Admitting: Family Medicine

## 2017-10-20 ENCOUNTER — Ambulatory Visit (HOSPITAL_BASED_OUTPATIENT_CLINIC_OR_DEPARTMENT_OTHER): Payer: Medicare Other

## 2017-10-20 ENCOUNTER — Emergency Department (HOSPITAL_COMMUNITY): Payer: Medicare Other

## 2017-10-20 DIAGNOSIS — E1122 Type 2 diabetes mellitus with diabetic chronic kidney disease: Secondary | ICD-10-CM | POA: Diagnosis present

## 2017-10-20 DIAGNOSIS — N289 Disorder of kidney and ureter, unspecified: Secondary | ICD-10-CM | POA: Diagnosis not present

## 2017-10-20 DIAGNOSIS — Z743 Need for continuous supervision: Secondary | ICD-10-CM | POA: Diagnosis not present

## 2017-10-20 DIAGNOSIS — R0902 Hypoxemia: Secondary | ICD-10-CM | POA: Diagnosis present

## 2017-10-20 DIAGNOSIS — E119 Type 2 diabetes mellitus without complications: Secondary | ICD-10-CM | POA: Diagnosis not present

## 2017-10-20 DIAGNOSIS — R279 Unspecified lack of coordination: Secondary | ICD-10-CM | POA: Diagnosis not present

## 2017-10-20 DIAGNOSIS — R531 Weakness: Secondary | ICD-10-CM

## 2017-10-20 DIAGNOSIS — R41841 Cognitive communication deficit: Secondary | ICD-10-CM | POA: Diagnosis not present

## 2017-10-20 DIAGNOSIS — Z9114 Patient's other noncompliance with medication regimen: Secondary | ICD-10-CM | POA: Diagnosis not present

## 2017-10-20 DIAGNOSIS — Z7901 Long term (current) use of anticoagulants: Secondary | ICD-10-CM | POA: Diagnosis not present

## 2017-10-20 DIAGNOSIS — N179 Acute kidney failure, unspecified: Secondary | ICD-10-CM | POA: Diagnosis not present

## 2017-10-20 DIAGNOSIS — S81812A Laceration without foreign body, left lower leg, initial encounter: Secondary | ICD-10-CM | POA: Diagnosis present

## 2017-10-20 DIAGNOSIS — Z794 Long term (current) use of insulin: Secondary | ICD-10-CM | POA: Diagnosis not present

## 2017-10-20 DIAGNOSIS — R5381 Other malaise: Secondary | ICD-10-CM | POA: Diagnosis present

## 2017-10-20 DIAGNOSIS — R2689 Other abnormalities of gait and mobility: Secondary | ICD-10-CM | POA: Diagnosis not present

## 2017-10-20 DIAGNOSIS — J811 Chronic pulmonary edema: Secondary | ICD-10-CM | POA: Diagnosis not present

## 2017-10-20 DIAGNOSIS — I481 Persistent atrial fibrillation: Secondary | ICD-10-CM | POA: Diagnosis not present

## 2017-10-20 DIAGNOSIS — M6281 Muscle weakness (generalized): Secondary | ICD-10-CM | POA: Diagnosis not present

## 2017-10-20 DIAGNOSIS — Z8546 Personal history of malignant neoplasm of prostate: Secondary | ICD-10-CM | POA: Diagnosis not present

## 2017-10-20 DIAGNOSIS — S81812D Laceration without foreign body, left lower leg, subsequent encounter: Secondary | ICD-10-CM | POA: Diagnosis not present

## 2017-10-20 DIAGNOSIS — E1129 Type 2 diabetes mellitus with other diabetic kidney complication: Secondary | ICD-10-CM | POA: Diagnosis not present

## 2017-10-20 DIAGNOSIS — I4891 Unspecified atrial fibrillation: Secondary | ICD-10-CM | POA: Diagnosis present

## 2017-10-20 DIAGNOSIS — S0990XA Unspecified injury of head, initial encounter: Secondary | ICD-10-CM | POA: Diagnosis not present

## 2017-10-20 DIAGNOSIS — I5033 Acute on chronic diastolic (congestive) heart failure: Secondary | ICD-10-CM | POA: Diagnosis present

## 2017-10-20 DIAGNOSIS — J9811 Atelectasis: Secondary | ICD-10-CM | POA: Diagnosis not present

## 2017-10-20 DIAGNOSIS — D638 Anemia in other chronic diseases classified elsewhere: Secondary | ICD-10-CM | POA: Diagnosis not present

## 2017-10-20 DIAGNOSIS — S91301D Unspecified open wound, right foot, subsequent encounter: Secondary | ICD-10-CM | POA: Diagnosis not present

## 2017-10-20 DIAGNOSIS — E785 Hyperlipidemia, unspecified: Secondary | ICD-10-CM | POA: Diagnosis present

## 2017-10-20 DIAGNOSIS — N183 Chronic kidney disease, stage 3 (moderate): Secondary | ICD-10-CM | POA: Diagnosis not present

## 2017-10-20 DIAGNOSIS — E1142 Type 2 diabetes mellitus with diabetic polyneuropathy: Secondary | ICD-10-CM | POA: Diagnosis present

## 2017-10-20 DIAGNOSIS — Z923 Personal history of irradiation: Secondary | ICD-10-CM | POA: Diagnosis not present

## 2017-10-20 DIAGNOSIS — N189 Chronic kidney disease, unspecified: Secondary | ICD-10-CM | POA: Diagnosis present

## 2017-10-20 DIAGNOSIS — L89322 Pressure ulcer of left buttock, stage 2: Secondary | ICD-10-CM | POA: Diagnosis not present

## 2017-10-20 DIAGNOSIS — S81802D Unspecified open wound, left lower leg, subsequent encounter: Secondary | ICD-10-CM | POA: Diagnosis not present

## 2017-10-20 DIAGNOSIS — R278 Other lack of coordination: Secondary | ICD-10-CM | POA: Diagnosis not present

## 2017-10-20 DIAGNOSIS — R296 Repeated falls: Secondary | ICD-10-CM

## 2017-10-20 DIAGNOSIS — Z881 Allergy status to other antibiotic agents status: Secondary | ICD-10-CM | POA: Diagnosis not present

## 2017-10-20 DIAGNOSIS — Z8673 Personal history of transient ischemic attack (TIA), and cerebral infarction without residual deficits: Secondary | ICD-10-CM | POA: Diagnosis not present

## 2017-10-20 DIAGNOSIS — Z79899 Other long term (current) drug therapy: Secondary | ICD-10-CM | POA: Diagnosis not present

## 2017-10-20 HISTORY — PX: TRANSTHORACIC ECHOCARDIOGRAM: SHX275

## 2017-10-20 LAB — CBC
HCT: 34.6 % — ABNORMAL LOW (ref 39.0–52.0)
Hemoglobin: 11.1 g/dL — ABNORMAL LOW (ref 13.0–17.0)
MCH: 30.2 pg (ref 26.0–34.0)
MCHC: 32.1 g/dL (ref 30.0–36.0)
MCV: 94.3 fL (ref 78.0–100.0)
Platelets: 227 10*3/uL (ref 150–400)
RBC: 3.67 MIL/uL — AB (ref 4.22–5.81)
RDW: 13.4 % (ref 11.5–15.5)
WBC: 11.8 10*3/uL — AB (ref 4.0–10.5)

## 2017-10-20 LAB — SODIUM, URINE, RANDOM: Sodium, Ur: 73 mmol/L

## 2017-10-20 LAB — BASIC METABOLIC PANEL
ANION GAP: 10 (ref 5–15)
BUN: 38 mg/dL — ABNORMAL HIGH (ref 8–23)
CHLORIDE: 105 mmol/L (ref 98–111)
CO2: 22 mmol/L (ref 22–32)
CREATININE: 1.64 mg/dL — AB (ref 0.61–1.24)
Calcium: 8.6 mg/dL — ABNORMAL LOW (ref 8.9–10.3)
GFR calc non Af Amer: 37 mL/min — ABNORMAL LOW (ref >60)
GFR, EST AFRICAN AMERICAN: 43 mL/min — AB (ref >60)
Glucose, Bld: 136 mg/dL — ABNORMAL HIGH (ref 70–99)
POTASSIUM: 4.1 mmol/L (ref 3.5–5.1)
SODIUM: 137 mmol/L (ref 135–145)

## 2017-10-20 LAB — I-STAT TROPONIN, ED: Troponin i, poc: 0.04 ng/mL (ref 0.00–0.08)

## 2017-10-20 LAB — URINALYSIS, ROUTINE W REFLEX MICROSCOPIC
Bilirubin Urine: NEGATIVE
GLUCOSE, UA: NEGATIVE mg/dL
Hgb urine dipstick: NEGATIVE
Ketones, ur: NEGATIVE mg/dL
LEUKOCYTES UA: NEGATIVE
Nitrite: NEGATIVE
PROTEIN: NEGATIVE mg/dL
Specific Gravity, Urine: 1.013 (ref 1.005–1.030)
pH: 5 (ref 5.0–8.0)

## 2017-10-20 LAB — GLUCOSE, CAPILLARY
GLUCOSE-CAPILLARY: 156 mg/dL — AB (ref 70–99)
GLUCOSE-CAPILLARY: 175 mg/dL — AB (ref 70–99)
Glucose-Capillary: 138 mg/dL — ABNORMAL HIGH (ref 70–99)
Glucose-Capillary: 227 mg/dL — ABNORMAL HIGH (ref 70–99)

## 2017-10-20 LAB — CBG MONITORING, ED: GLUCOSE-CAPILLARY: 117 mg/dL — AB (ref 70–99)

## 2017-10-20 LAB — ECHOCARDIOGRAM COMPLETE
HEIGHTINCHES: 73 in
Weight: 3952 oz

## 2017-10-20 LAB — BRAIN NATRIURETIC PEPTIDE: B Natriuretic Peptide: 268.2 pg/mL — ABNORMAL HIGH (ref 0.0–100.0)

## 2017-10-20 LAB — CREATININE, URINE, RANDOM: Creatinine, Urine: 82.8 mg/dL

## 2017-10-20 MED ORDER — RIVAROXABAN 20 MG PO TABS: 20.0000 mg | ORAL_TABLET | Freq: Every day | ORAL | Status: DC

## 2017-10-20 MED ORDER — FUROSEMIDE 10 MG/ML IJ SOLN
20.0000 mg | Freq: Once | INTRAMUSCULAR | Status: AC
  Administered 2017-10-20: 20 mg via INTRAVENOUS
  Filled 2017-10-20: qty 2

## 2017-10-20 MED ORDER — PERFLUTREN LIPID MICROSPHERE
INTRAVENOUS | Status: AC
  Administered 2017-10-20: 3 mL
  Filled 2017-10-20: qty 10

## 2017-10-20 MED ORDER — GUAIFENESIN ER 600 MG PO TB12
1200.0000 mg | ORAL_TABLET | Freq: Two times a day (BID) | ORAL | Status: DC
  Administered 2017-10-20 – 2017-10-23 (×7): 1200 mg via ORAL
  Filled 2017-10-20 (×6): qty 2

## 2017-10-20 MED ORDER — ALBUTEROL SULFATE (2.5 MG/3ML) 0.083% IN NEBU
5.0000 mg | INHALATION_SOLUTION | Freq: Once | RESPIRATORY_TRACT | Status: AC
  Administered 2017-10-20: 5 mg via RESPIRATORY_TRACT
  Filled 2017-10-20: qty 6

## 2017-10-20 MED ORDER — PERFLUTREN LIPID MICROSPHERE
1.0000 mL | INTRAVENOUS | Status: AC | PRN
  Filled 2017-10-20: qty 10

## 2017-10-20 MED ORDER — INSULIN ASPART 100 UNIT/ML ~~LOC~~ SOLN
0.0000 [IU] | Freq: Three times a day (TID) | SUBCUTANEOUS | Status: DC
  Administered 2017-10-20: 1 [IU] via SUBCUTANEOUS
  Administered 2017-10-20 (×2): 2 [IU] via SUBCUTANEOUS
  Administered 2017-10-21: 8 [IU] via SUBCUTANEOUS
  Administered 2017-10-21: 5 [IU] via SUBCUTANEOUS
  Administered 2017-10-21: 2 [IU] via SUBCUTANEOUS
  Administered 2017-10-22: 5 [IU] via SUBCUTANEOUS
  Administered 2017-10-22: 2 [IU] via SUBCUTANEOUS
  Administered 2017-10-22 – 2017-10-23 (×2): 5 [IU] via SUBCUTANEOUS
  Administered 2017-10-23: 3 [IU] via SUBCUTANEOUS

## 2017-10-20 MED ORDER — IPRATROPIUM BROMIDE 0.02 % IN SOLN
RESPIRATORY_TRACT | Status: AC
  Administered 2017-10-20: 0.5 mg via RESPIRATORY_TRACT
  Filled 2017-10-20: qty 2.5

## 2017-10-20 MED ORDER — SODIUM CHLORIDE 0.9 % IV SOLN: 250.0000 mL | INTRAVENOUS | Status: DC | PRN

## 2017-10-20 MED ORDER — TAMSULOSIN HCL 0.4 MG PO CAPS
0.4000 mg | ORAL_CAPSULE | Freq: Every day | ORAL | Status: DC
  Administered 2017-10-20 – 2017-10-23 (×4): 0.4 mg via ORAL
  Filled 2017-10-20 (×4): qty 1

## 2017-10-20 MED ORDER — SODIUM CHLORIDE 0.9% FLUSH
3.0000 mL | Freq: Two times a day (BID) | INTRAVENOUS | Status: DC
  Administered 2017-10-20 – 2017-10-23 (×7): 3 mL via INTRAVENOUS

## 2017-10-20 MED ORDER — INSULIN GLARGINE 100 UNIT/ML ~~LOC~~ SOLN
18.0000 [IU] | Freq: Every day | SUBCUTANEOUS | Status: DC
  Administered 2017-10-20: 18 [IU] via SUBCUTANEOUS
  Filled 2017-10-20 (×2): qty 0.18

## 2017-10-20 MED ORDER — LEVALBUTEROL HCL 0.63 MG/3ML IN NEBU
0.6300 mg | INHALATION_SOLUTION | Freq: Four times a day (QID) | RESPIRATORY_TRACT | Status: DC
  Administered 2017-10-20 – 2017-10-21 (×6): 0.63 mg via RESPIRATORY_TRACT
  Filled 2017-10-20 (×6): qty 3

## 2017-10-20 MED ORDER — ONDANSETRON HCL 4 MG/2ML IJ SOLN: 4.0000 mg | Freq: Four times a day (QID) | INTRAMUSCULAR | Status: DC | PRN

## 2017-10-20 MED ORDER — LEVALBUTEROL HCL 0.63 MG/3ML IN NEBU
INHALATION_SOLUTION | RESPIRATORY_TRACT | Status: AC
  Administered 2017-10-20: 0.63 mg via RESPIRATORY_TRACT
  Filled 2017-10-20: qty 3

## 2017-10-20 MED ORDER — PREGABALIN 75 MG PO CAPS
75.0000 mg | ORAL_CAPSULE | Freq: Two times a day (BID) | ORAL | Status: DC
  Administered 2017-10-20 – 2017-10-23 (×7): 75 mg via ORAL
  Filled 2017-10-20 (×7): qty 1

## 2017-10-20 MED ORDER — FUROSEMIDE 10 MG/ML IJ SOLN: 40.0000 mg | Freq: Two times a day (BID) | INTRAMUSCULAR | Status: DC

## 2017-10-20 MED ORDER — ESCITALOPRAM OXALATE 10 MG PO TABS
5.0000 mg | ORAL_TABLET | Freq: Every day | ORAL | Status: DC
  Administered 2017-10-20 – 2017-10-23 (×4): 5 mg via ORAL
  Filled 2017-10-20 (×4): qty 1

## 2017-10-20 MED ORDER — IPRATROPIUM BROMIDE 0.02 % IN SOLN
0.5000 mg | Freq: Four times a day (QID) | RESPIRATORY_TRACT | Status: DC
  Administered 2017-10-20 – 2017-10-21 (×6): 0.5 mg via RESPIRATORY_TRACT
  Filled 2017-10-20 (×6): qty 2.5

## 2017-10-20 MED ORDER — INSULIN ASPART 100 UNIT/ML ~~LOC~~ SOLN
0.0000 [IU] | Freq: Every day | SUBCUTANEOUS | Status: DC
  Administered 2017-10-21: 2 [IU] via SUBCUTANEOUS
  Administered 2017-10-22: 3 [IU] via SUBCUTANEOUS

## 2017-10-20 MED ORDER — RIVAROXABAN 15 MG PO TABS
15.0000 mg | ORAL_TABLET | Freq: Every day | ORAL | Status: DC
  Administered 2017-10-20 – 2017-10-22 (×3): 15 mg via ORAL
  Filled 2017-10-20 (×3): qty 1

## 2017-10-20 MED ORDER — ACETAMINOPHEN 325 MG PO TABS: 650.0000 mg | ORAL_TABLET | ORAL | Status: DC | PRN

## 2017-10-20 MED ORDER — FUROSEMIDE 10 MG/ML IJ SOLN
40.0000 mg | Freq: Two times a day (BID) | INTRAMUSCULAR | Status: DC
  Administered 2017-10-20 – 2017-10-22 (×4): 40 mg via INTRAVENOUS
  Filled 2017-10-20 (×4): qty 4

## 2017-10-20 MED ORDER — SODIUM CHLORIDE 0.9% FLUSH: 3.0000 mL | INTRAVENOUS | Status: DC | PRN

## 2017-10-20 MED ORDER — METOPROLOL TARTRATE 12.5 MG HALF TABLET
12.5000 mg | ORAL_TABLET | Freq: Two times a day (BID) | ORAL | Status: DC
  Administered 2017-10-20 – 2017-10-23 (×7): 12.5 mg via ORAL
  Filled 2017-10-20 (×7): qty 1

## 2017-10-20 MED ORDER — ATORVASTATIN CALCIUM 40 MG PO TABS
40.0000 mg | ORAL_TABLET | Freq: Every day | ORAL | Status: DC
  Administered 2017-10-20 – 2017-10-22 (×3): 40 mg via ORAL
  Filled 2017-10-20 (×3): qty 1

## 2017-10-20 NOTE — Consult Note (Signed)
   [facility] CM Inpatient Consult   10/20/2017  RAKAN SOFFER 1932-09-08 [id number]   Patient assessed for high risk in the Medicare ACO for unplanned re-admission.  PT notes reviewed and currently they are recommending a skilled facility placement for short term rehab.  Patient lives alone with HX of falls.  Admitted with acute on chronic HF with hypoxia. Will follow for progression and needs as appropriate.  For questions please contact:  Natividad Brood, RN BSN [facility] Liaison  [phone] business mobile phone Toll free office [phone]

## 2017-10-20 NOTE — Progress Notes (Signed)
  Echocardiogram 2D Echocardiogram has been performed.  Charles Friedman 10/20/2017, 12:30 PM

## 2017-10-20 NOTE — H&P (Signed)
History and Physical    Charles Friedman:[id number] DOB: June 06, 1932 DOA: 10/19/2017  PCP: Haywood Pao, MD   Patient coming from: Home   Chief Complaint: Generalized weakness, recurrent falls, SOB, leg swelling   HPI: Charles Friedman is a 84 y.o. male with medical history significant for atrial fibrillation on Xarelto, insulin-dependent diabetes mellitus, chronic diastolic CHF, history of CVA, and history of prostate cancer status post radiation, now presenting to the emergency department with generalized weakness, recurrent falls, shortness of breath, and bilateral lower extremity edema.  Patient is accompanied by his son who assist with the history.  [name] has been progressively weak in general over the past several days and has fallen multiple times.  He was evaluated in the emergency department a few days ago after a fall with some minor skin tears and a right ear injury.  Since that time, he is continued to be generally weak and has become increasingly short of breath.  He denies fevers, chills, chest pain, or palpitations.  He reports swelling of the bilateral lower extremities.  He is prescribed Lasix 40 mg daily, but reports that he does not take this as he does not want to have to urinate more.  ED Course: Upon arrival to the ED, patient is found to be afebrile, saturating low 90s on room air while at rest, mildly tachypneic, and with blood pressure 100/58.  EKG features atrial fibrillation with nonspecific repolarization abnormality.  Noncontrast head CT is negative for acute intracranial abnormality and chest x-ray is notable for cardiomegaly with vascular congestion.  CBC features a serum creatinine 1.64, up from 1.08 two years ago.  CBC is notable for a leukocytosis to 11,800 and a mild normocytic anemia.  Troponin is normal and BNP is elevated to 268.  Patient was given an albuterol neb treatment and 20 mg IV Lasix.  He tried to ambulate, but was requiring intensive assistance just to stand  and dropped his O2 sats into the mid-80s with this.  He will be admitted for ongoing evaluation and management generalized weakness with recurrent falls and shortness of breath secondary to acute on chronic diastolic CHF, complicated by renal insufficiency.  Review of Systems:  All other systems reviewed and apart from HPI, are negative.  Past Medical History:  Diagnosis Date  . Atrial fibrillation ([place]) 2003   Initially on Coumadin, now on Xarelto  . Cataract    Bilateral cataracts.  . Diabetes mellitus without complication ([place])    type II  . Diabetic peripheral neuropathy associated with type 2 diabetes mellitus ([place])   . Glaucoma    left eye  . Hx of radiation therapy 08/15/13- 10/06/13   prostate 7600 cGy 38 sessions  . Hyperlipidemia with target LDL less than 70   . Neuropathy    diabetic, in feet  . Personal history of fall 09/26/13   front entrance of [place]  . Prostate cancer [facility])    History of radiation therapy    Past Surgical History:  Procedure Laterality Date  . CAROTID DOPPLERS  11/2015   Mild plaque bilaterally.  Less than 40%.  Antegrade vertebral flow.  Marland Kitchen PROSTATE BIOPSY  12/25/11   Adenocarcinoma, gleason 6  . TRANSTHORACIC ECHOCARDIOGRAM  11/2015   In setting of CVA: Technically difficult.  Normal LV size and function.  EF 55-60%.  No regional wall motion normality.  Moderate LA dilation, mild RA dilation.  Mildly increased PA pressures.     reports that he has never  smoked. He has never used smokeless tobacco. He reports that he does not drink alcohol or use drugs.  Allergies  Allergen Reactions  . Aureomycin [Chlortetracycline] Itching and Rash    Reaction approximately 1956    Family History  Problem Relation Age of Onset  . Cancer Sister 65       brain tumor died at age 18  . Heart disease Mother        Marena Chancy of specifics  . Heart attack Father 56       sudden cardiac death     Prior to Admission medications   Medication Sig Start Date End  Date Taking? Authorizing Provider  acetaminophen (TYLENOL) 500 MG tablet Take 2 tablets (1,000 mg total) by mouth every 6 (six) hours as needed. Patient taking differently: Take 1,000 mg by mouth every 6 (six) hours as needed for mild pain.  10/16/17  Yes Charlesetta Shanks, MD  atorvastatin (LIPITOR) 40 MG tablet Take 1 tablet (40 mg total) by mouth daily at 6 PM. 11/27/15  Yes Vann, Jessica U, DO  escitalopram (LEXAPRO) 5 MG tablet Take 5 mg by mouth daily.   Yes [provider]  furosemide (LASIX) 40 MG tablet Take 1 tablet by mouth daily. 04/02/17  Yes [provider]  insulin glargine (LANTUS) 100 UNIT/ML injection Inject 0.42 mLs (42 Units total) into the skin at bedtime. Patient taking differently: Inject 35 Units into the skin daily.  11/27/15  Yes Geradine Girt, DO  Insulin Lispro (HUMALOG KWIKPEN [place]) Inject 5 Units into the skin 3 (three) times daily.    Yes [provider]  LYRICA 75 MG capsule 75 mg 2 (two) times daily.  02/09/17  Yes [provider]  metoprolol tartrate (LOPRESSOR) 25 MG tablet Take one-half by mouth twice daily. May take additional one-half tab daily as needed. Patient taking differently: Take 12.5 mg by mouth 2 (two) times daily. May take additional one-half tab daily as needed. 02/20/17  Yes Leonie Man, MD  nitroGLYCERIN (NITROSTAT) 0.4 MG SL tablet Place 1 tablet (0.4 mg total) under the tongue every 5 (five) minutes as needed for chest pain. 01/14/17 10/20/17 Yes Leonie Man, MD  rivaroxaban (XARELTO) 20 MG TABS tablet Take 1 tablet (20 mg total) by mouth daily with supper. 11/27/15  Yes Eulogio Bear U, DO  tamsulosin (FLOMAX) 0.4 MG CAPS capsule Take 0.4 mg by mouth daily. 10/21/15  Yes [provider]    Physical Exam: Vitals:   10/20/17 0030 10/20/17 0200 10/20/17 0230  BP: 108/66 (!) 110/58 (!) 100/58  Pulse: 70 93 96  Resp: 14 (!) 22 18  Temp: 98.7 F (37.1 C)    TempSrc: Oral    SpO2: 95% 92% 96%       Constitutional: NAD, calm, lethargic  Eyes: PERTLA, lids and conjunctivae normal ENMT: Mucous membranes are moist. Posterior pharynx clear of any exudate or lesions.   Neck: normal, supple, no masses, no thyromegaly Respiratory: Rales bilaterally, mild tachypnea, dyspnea with speech. No wheezing. No accessory muscle use.  Cardiovascular: Rate ~80 and irregular. Pretibial pitting edema bilaterally. Abdomen: No distension, no tenderness, soft. Bowel sounds active.  Musculoskeletal: no clubbing / cyanosis. No joint deformity upper and lower extremities.  Skin: no significant rashes, lesions, ulcers. Warm, dry, well-perfused. Neurologic: No facial asymmetry. Sensation to light touch intact. Moving all extremities.  Psychiatric: Alert and oriented to person, place, and situation. Pleasant and cooperative.     Labs on Admission: I have personally  reviewed following labs and imaging studies  CBC: Recent Labs  Lab 10/20/17 0033  WBC 11.8*  HGB 11.1*  HCT 34.6*  MCV 94.3  PLT 378   Basic Metabolic Panel: Recent Labs  Lab 10/20/17 0033  NA 137  K 4.1  CL 105  CO2 22  GLUCOSE 136*  BUN 38*  CREATININE 1.64*  CALCIUM 8.6*   GFR: Estimated Creatinine Clearance: 44.2 mL/min (A) (by C-G formula based on SCr of 1.64 mg/dL (H)). Liver Function Tests: No results for input(s): AST, ALT, ALKPHOS, BILITOT, PROT, ALBUMIN in the last 168 hours. No results for input(s): LIPASE, AMYLASE in the last 168 hours. No results for input(s): AMMONIA in the last 168 hours. Coagulation Profile: No results for input(s): INR, PROTIME in the last 168 hours. Cardiac Enzymes: No results for input(s): CKTOTAL, CKMB, CKMBINDEX, TROPONINI in the last 168 hours. BNP (last 3 results) No results for input(s): PROBNP in the last 8760 hours. HbA1C: No results for input(s): HGBA1C in the last 72 hours. CBG: Recent Labs  Lab 10/16/17 0853 10/20/17 0038  GLUCAP 101* 117*   Lipid Profile: No  results for input(s): CHOL, HDL, LDLCALC, TRIG, CHOLHDL, LDLDIRECT in the last 72 hours. Thyroid Function Tests: No results for input(s): TSH, T4TOTAL, FREET4, T3FREE, THYROIDAB in the last 72 hours. Anemia Panel: No results for input(s): VITAMINB12, FOLATE, FERRITIN, TIBC, IRON, RETICCTPCT in the last 72 hours. Urine analysis:    Component Value Date/Time   COLORURINE YELLOW 11/25/2015 1937   APPEARANCEUR CLEAR 11/25/2015 1937   LABSPEC 1.015 11/25/2015 1937   PHURINE 5.5 11/25/2015 1937   GLUCOSEU >1000 (A) 11/25/2015 1937   HGBUR TRACE (A) 11/25/2015 1937   BILIRUBINUR NEGATIVE 11/25/2015 1937   KETONESUR NEGATIVE 11/25/2015 1937   PROTEINUR NEGATIVE 11/25/2015 1937   UROBILINOGEN 0.2 07/15/2014 2038   NITRITE NEGATIVE 11/25/2015 1937   LEUKOCYTESUR NEGATIVE 11/25/2015 1937   Sepsis Labs: @LABRCNTIP (procalcitonin:4,lacticidven:4) )No results found for this or any previous visit (from the past 240 hour(s)).   Radiological Exams on Admission: Ct Head Wo Contrast  Result Date: 10/20/2017 CLINICAL DATA:  [place] twice today. Generalized weakness. History of diabetes, prostate cancer, hyperlipidemia, stroke. EXAM: CT HEAD WITHOUT CONTRAST TECHNIQUE: Contiguous axial images were obtained from the base of the skull through the vertex without intravenous contrast. COMPARISON:  CT HEAD October 16, 2017 FINDINGS: BRAIN: No intraparenchymal hemorrhage, mass effect nor midline shift. The ventricles and sulci are normal for age. Patchy supratentorial white matter hypodensities within normal range for patient's age, though non-specific are most compatible with chronic small vessel ischemic disease. Old small bifrontal lobe infarcts. No acute large vascular territory infarcts. No abnormal extra-axial fluid collections. Basal cisterns are patent. VASCULAR: Mild calcific atherosclerosis of the carotid siphons. SKULL: No skull fracture. No significant scalp soft tissue swelling. RIGHT ear thickening at site  of prior hemorrhage/laceration. SINUSES/ORBITS: Mild paranasal sinus mucosal thickening. Mastoid air cells are well aerated.The included ocular globes and orbital contents are non-suspicious. Status post bilateral ocular lens implants. OTHER: Multiple dental caries. IMPRESSION: 1. No acute intracranial process. 2. Stable examination including old small bifrontal lobe infarcts and moderate chronic small vessel ischemic changes. Electronically Signed   By: Elon Alas M.D.   On: 10/20/2017 01:59   Dg Chest Portable 1 View  Result Date: 10/20/2017 CLINICAL DATA:  Shortness of breath EXAM: PORTABLE CHEST 1 VIEW COMPARISON:  November 25, 2015 FINDINGS: Cardiomegaly and pulmonary venous congestion. No pneumothorax. No other acute abnormalities. IMPRESSION: Cardiomegaly and pulmonary venous congestion.  Electronically Signed   By: Dorise Bullion III M.D   On: 10/20/2017 00:27    EKG: Independently reviewed. Atrial fibrillation, non-specific repolarization abnormality.   Assessment/Plan  1. Acute on chronic diastolic CHF  - Presents with generalized weakness, falls, and SOB with leg swelling  - He is prescribed Lasix 40 mg daily but has not been taking (d/t not wanting to have to urinate more frequently)  - There is peripheral edema, rales on exam, and CXR-findings are consistent with CHF    - He was given Lasix 20 mg IV in ED, has not yet urinated much and will be given a second dose  - Continue diuresis with Lasix 40 mg IV q12h, continue cardiac monitoring, SLIV, follow daily wt and I/O's, update echocardiogram, continue beta-blocker as tolerated   2. Renal insufficiency  - SCr is 1.64 on admission with unknown chronicity as there are no recent labs; was 1.08 in 2017  - Check urine chemistries, renally-dose medications, follow daily chem panel during diuresis    3. Debility; frequent falls  - Patient has been generally week in recent days and has fallen multiple times, injuring his right ear and  suffering minor skin tears  - Head CT with no acute findings and no focal deficits detected on exam  - Hopefully, some of this may improve with diuresis and improved respiratory status  - PT eval and tx requested   4. Atrial fibrillation  - In rate-controlled a fib on admission  - CHADS-VASc 6 (age x2, CVA x2, DM, CHF)  - Continue Xarelto with renal-dosing, continue metoprolol as BP allows   5. Insulin-dependent DM  - A1c was 8.8% two years ago  - Managed at home with Lantus 35 units qD and Humalog 5 units TID  - Continue Lantus with dose-reduction to avoid hypoglycemia with new renal insufficiency, use Novolog correctional   6. History of CVA  - Reports generalized weakness but denies any focal deficits  - Head CT with no acute findings  - Continue statin, continue Xarelto for CVA prevention     DVT prophylaxis: Xarelto  Code Status: Full  Family Communication: Son updated at bedside Consults called: None  Admission status: Observation     Vianne Bulls, MD [facility] Pager [phone]  If 7PM-7AM, please contact night-coverage [email or web address] Password [email or web address]  10/20/2017, 3:32 AM

## 2017-10-20 NOTE — ED Notes (Signed)
Pt reports he was trying to get to his walker from his lift chair and fell. EMS responded and lifted the pt off the floor and family reports he was not able to stand with same which is unusual for him. Pt has had 3 reported falls in the last 3 days. Pt reports he is SOB and family reports this is new for him with no reported respiratory history.

## 2017-10-20 NOTE — ED Notes (Signed)
RN Nicki Reaper to get blood

## 2017-10-20 NOTE — ED Notes (Signed)
Attempted to walk pt and he had an extremely hard time getting up to get out of the bed. Once the pt was up, he had a hard time standing without assistance. Once standing the pt's oxygen saturation dropped to 89% on room air and then as he took 2 steps his room air oxygen saturation dropped to 85%. Dr. Dina Rich notified.

## 2017-10-20 NOTE — ED Provider Notes (Signed)
[facility] EMERGENCY DEPARTMENT Provider Note   CSN: [id number] Arrival date & time: 10/19/17  2350     History   Chief Complaint Chief Complaint  Patient presents with  . Fall  . Shortness of Breath    HPI Charles Friedman is a 84 y.o. male.  HPI  This is an 84 year old male with a history of atrial fibrillation on Xarelto, diabetes, hyperlipidemia, neuropathy who presents with shortness of breath and a fall.  Patient reports that he fell hitting his right ear on Friday.  He was seen and evaluated.  At that time his ear was repaired and his CT scan was negative for acute bleed.  Patient states tonight he was getting up to walk over to his walker when he tripped and fell.  He denies any dizziness or loss of consciousness.  He denies hitting his head.  He denies any pain.  He states he was unable to get up from the ground and called EMS.  Patient reports recent history of "head cold that I cannot get to have."  Denies any fevers.  Does endorse cough.  Reports some shortness of breath that is worse with laying flat.  Denies any history of heart failure but does report history of taking diuretics although he states he does not like them and does not take them now.  Has not noted any increased lower extremity swelling.  Does not wear oxygen at home.  Denies weakness, numbness, tingling of the lower extremities.  Past Medical History:  Diagnosis Date  . Atrial fibrillation ([place]) 2003   Initially on Coumadin, now on Xarelto  . Cataract    Bilateral cataracts.  . Diabetes mellitus without complication ([place])    type II  . Diabetic peripheral neuropathy associated with type 2 diabetes mellitus ([place])   . Glaucoma    left eye  . Hx of radiation therapy 08/15/13- 10/06/13   prostate 7600 cGy 38 sessions  . Hyperlipidemia with target LDL less than 70   . Neuropathy    diabetic, in feet  . Personal history of fall 09/26/13   front entrance of [place]  . Prostate cancer [facility])    History of radiation therapy    Patient Active Problem List   Diagnosis Date Noted  . Bilateral edema of lower extremity 04/10/2017  . Chronic diastolic heart failure, NYHA class 2 ([place]) 04/10/2017  . Stable angina ([place]) 01/14/2017  . Hyperlipidemia with target LDL less than 100 01/22/2016  . Abnormality of gait 01/22/2016  . History of CVA (cerebrovascular accident) 11/26/2015  . TIA (transient ischemic attack) 11/25/2015  . Cerebral infarction ([place]) 11/25/2015  . Stroke (cerebrum) ([place]) 11/25/2015  . Type 2 diabetes mellitus with hyperglycemia ([place]) 07/15/2014  . UTI (lower urinary tract infection) 07/15/2014  . Fall at home 07/15/2014  . Closed rib fracture 07/15/2014  . Dehydration, mild 07/15/2014  . Diabetes mellitus without complication ([place])   . Hx of radiation therapy   . Persistent atrial fibrillation ([place])   . Prostate cancer ([place]) 01/29/2012    Past Surgical History:  Procedure Laterality Date  . CAROTID DOPPLERS  11/2015   Mild plaque bilaterally.  Less than 40%.  Antegrade vertebral flow.  Marland Kitchen PROSTATE BIOPSY  12/25/11   Adenocarcinoma, gleason 6  . TRANSTHORACIC ECHOCARDIOGRAM  11/2015   In setting of CVA: Technically difficult.  Normal LV size and function.  EF 55-60%.  No regional wall motion normality.  Moderate LA dilation, mild RA dilation.  Mildly  increased PA pressures.        Home Medications    Prior to Admission medications   Medication Sig Start Date End Date Taking? Authorizing Provider  acetaminophen (TYLENOL) 500 MG tablet Take 2 tablets (1,000 mg total) by mouth every 6 (six) hours as needed. 10/16/17   Charlesetta Shanks, MD  atorvastatin (LIPITOR) 40 MG tablet Take 1 tablet (40 mg total) by mouth daily at 6 PM. 11/27/15   Eulogio Bear U, DO  escitalopram (LEXAPRO) 5 MG tablet Take 5 mg by mouth daily.    [provider]  furosemide (LASIX) 40 MG tablet Take 1 tablet by mouth daily. 04/02/17   [provider]  insulin glargine  (LANTUS) 100 UNIT/ML injection Inject 0.42 mLs (42 Units total) into the skin at bedtime. Patient taking differently: Inject 35 Units into the skin daily.  11/27/15   Geradine Girt, DO  Insulin Lispro (HUMALOG KWIKPEN [place]) Inject 5 Units into the skin 3 (three) times daily.     [provider]  LYRICA 75 MG capsule 75 mg 2 (two) times daily.  02/09/17   [provider]  metoprolol tartrate (LOPRESSOR) 25 MG tablet Take one-half by mouth twice daily. May take additional one-half tab daily as needed. Patient taking differently: Take 12.5 mg by mouth 2 (two) times daily. May take additional one-half tab daily as needed. 02/20/17   Leonie Man, MD  nitroGLYCERIN (NITROSTAT) 0.4 MG SL tablet Place 1 tablet (0.4 mg total) under the tongue every 5 (five) minutes as needed for chest pain. 01/14/17 10/16/17  Leonie Man, MD  rivaroxaban (XARELTO) 20 MG TABS tablet Take 1 tablet (20 mg total) by mouth daily with supper. 11/27/15   Geradine Girt, DO  tamsulosin (FLOMAX) 0.4 MG CAPS capsule Take 0.4 mg by mouth daily. 10/21/15   [provider]    Family History Family History  Problem Relation Age of Onset  . Cancer Sister 87       brain tumor died at age 59  . Heart disease Mother        Marena Chancy of specifics  . Heart attack Father 13       sudden cardiac death    Social History Social History   Tobacco Use  . Smoking status: Never Smoker  . Smokeless tobacco: Never Used  Substance Use Topics  . Alcohol use: No  . Drug use: No     Allergies   Aureomycin [chlortetracycline]   Review of Systems Review of Systems  Constitutional: Negative for fever.  Respiratory: Positive for shortness of breath. Negative for cough.   Cardiovascular: Negative for chest pain and leg swelling.  Gastrointestinal: Negative for abdominal pain and nausea.  Musculoskeletal: Negative for neck pain.  Neurological: Negative for dizziness, syncope, weakness, light-headedness and  headaches.  Psychiatric/Behavioral: Negative for confusion.  All other systems reviewed and are negative.    Physical Exam Updated Vital Signs BP (!) 100/58   Pulse 96   Temp 98.7 F (37.1 C) (Oral)   Resp 18   SpO2 96%   Physical Exam  Constitutional: He is oriented to person, place, and time.  Elderly, nontoxic-appearing, ABCs intact  HENT:  Head: Normocephalic.  Hematoma and swelling noted of the right ear with sutures in place  Eyes: Pupils are equal, round, and reactive to light.  Neck: Normal range of motion. Neck supple.  No midline C-spine tenderness to palpation  Cardiovascular: Normal rate, regular rhythm and normal heart sounds.  No murmur heard. Pulmonary/Chest: Effort normal. No respiratory distress. He has wheezes.  Nasal cannula in place, patient with diminished breath sounds in all lung fields, occasional wheeze  Abdominal: Soft. Bowel sounds are normal. There is no tenderness. There is no rebound.  Musculoskeletal:       Right lower leg: He exhibits edema.       Left lower leg: He exhibits edema.  Trace to 1+ bilateral lower extremity edema  Lymphadenopathy:    He has no cervical adenopathy.  Neurological: He is alert and oriented to person, place, and time.  Skin: Skin is warm and dry.  Psychiatric: He has a normal mood and affect.  Nursing note and vitals reviewed.    ED Treatments / Results  Labs (all labs ordered are listed, but only abnormal results are displayed) Labs Reviewed  BASIC METABOLIC PANEL - Abnormal; Notable for the following components:      Result Value   Glucose, Bld 136 (*)    BUN 38 (*)    Creatinine, Ser 1.64 (*)    Calcium 8.6 (*)    GFR calc non Af Amer 37 (*)    GFR calc Af Amer 43 (*)    All other components within normal limits  CBC - Abnormal; Notable for the following components:   WBC 11.8 (*)    RBC 3.67 (*)    Hemoglobin 11.1 (*)    HCT 34.6 (*)    All other components within normal limits  BRAIN  NATRIURETIC PEPTIDE - Abnormal; Notable for the following components:   B Natriuretic Peptide 268.2 (*)    All other components within normal limits  CBG MONITORING, ED - Abnormal; Notable for the following components:   Glucose-Capillary 117 (*)    All other components within normal limits  URINALYSIS, ROUTINE W REFLEX MICROSCOPIC  I-STAT TROPONIN, ED    EKG EKG Interpretation  Date/Time:  Tuesday October 20 2017 00:11:53 EDT Ventricular Rate:  98 PR Interval:    QRS Duration: 94 QT Interval:  354 QTC Calculation: 452 R Axis:   86 Text Interpretation:  Atrial fibrillation Borderline right axis deviation Nonspecific repol abnormality, diffuse leads Confirmed by Thayer Jew [phone]) on 10/20/2017 2:06:43 AM   Radiology Ct Head Wo Contrast  Result Date: 10/20/2017 CLINICAL DATA:  [place] twice today. Generalized weakness. History of diabetes, prostate cancer, hyperlipidemia, stroke. EXAM: CT HEAD WITHOUT CONTRAST TECHNIQUE: Contiguous axial images were obtained from the base of the skull through the vertex without intravenous contrast. COMPARISON:  CT HEAD October 16, 2017 FINDINGS: BRAIN: No intraparenchymal hemorrhage, mass effect nor midline shift. The ventricles and sulci are normal for age. Patchy supratentorial white matter hypodensities within normal range for patient's age, though non-specific are most compatible with chronic small vessel ischemic disease. Old small bifrontal lobe infarcts. No acute large vascular territory infarcts. No abnormal extra-axial fluid collections. Basal cisterns are patent. VASCULAR: Mild calcific atherosclerosis of the carotid siphons. SKULL: No skull fracture. No significant scalp soft tissue swelling. RIGHT ear thickening at site of prior hemorrhage/laceration. SINUSES/ORBITS: Mild paranasal sinus mucosal thickening. Mastoid air cells are well aerated.The included ocular globes and orbital contents are non-suspicious. Status post bilateral ocular lens  implants. OTHER: Multiple dental caries. IMPRESSION: 1. No acute intracranial process. 2. Stable examination including old small bifrontal lobe infarcts and moderate chronic small vessel ischemic changes. Electronically Signed   By: Elon Alas M.D.   On: 10/20/2017 01:59   Dg Chest Portable 1 View  Result Date: 10/20/2017  CLINICAL DATA:  Shortness of breath EXAM: PORTABLE CHEST 1 VIEW COMPARISON:  November 25, 2015 FINDINGS: Cardiomegaly and pulmonary venous congestion. No pneumothorax. No other acute abnormalities. IMPRESSION: Cardiomegaly and pulmonary venous congestion. Electronically Signed   By: Dorise Bullion III M.D   On: 10/20/2017 00:27    Procedures Procedures (including critical care time)  CRITICAL CARE Performed by: Merryl Hacker   Total critical care time: 35 minutes  Critical care time was exclusive of separately billable procedures and treating other patients.  Critical care was necessary to treat or prevent imminent or life-threatening deterioration.  Critical care was time spent personally by me on the following activities: development of treatment plan with patient and/or surrogate as well as nursing, discussions with consultants, evaluation of patient's response to treatment, examination of patient, obtaining history from patient or surrogate, ordering and performing treatments and interventions, ordering and review of laboratory studies, ordering and review of radiographic studies, pulse oximetry and re-evaluation of patient's condition.   Medications Ordered in ED Medications  albuterol (PROVENTIL) (2.5 MG/3ML) 0.083% nebulizer solution 5 mg (5 mg Nebulization Given 10/20/17 0032)  furosemide (LASIX) injection 20 mg (20 mg Intravenous Given 10/20/17 0226)     Initial Impression / Assessment and Plan / ED Course  I have reviewed the triage vital signs and the nursing notes.  Pertinent labs & imaging results that were available during my care of the patient  were reviewed by me and considered in my medical decision making (see chart for details).     Patient presents after a fall at home.  Reports recent "head cold" and shortness of breath.  He is elderly but nontoxic-appearing and ABCs are intact.  He is on some supplemental oxygen for O2 sats of 88%.  He does not wear oxygen at home.  Slight lower extremity edema but wheezing on exam.  No history of smoking.  Question undiagnosed CHF.  Denies any pain from fall.  Repeat head CT is negative.  He is on blood thinners.  Without hypoxia and shortness of breath which is related to PE given ongoing blood thinner use.  Chest x-ray shows evidence of cardiomegaly and pulmonary edema.  Patient does not currently take a diuretic (although he is supposed to).  Suspect shortness of breath may be related to volume overload.  Patient was given 1 dose of IV Lasix.  Attempts at ambulation were unsuccessful as patient was very unsteady on his feet and dropped his O2 sats to 85%.  Will admit for diuresis.   Final Clinical Impressions(s) / ED Diagnoses   Final diagnoses:  Hypoxia  Fall, initial encounter    ED Discharge Orders    None       Horton, Barbette Hair, MD 10/20/17 [phone]

## 2017-10-20 NOTE — Progress Notes (Signed)
Patient was admitted early this morning after midnight and H&P has been reviewed and I am in current agreement with assessment plan done by Dr. Mitzi Hansen.  Additional changes the plan of care been made accordingly.  Patient is a pleasant 84 year old Caucasian male with a history significant for atrial fibrillation on anticoagulation with Xarelto, insulin-dependent diabetes mellitus, chronic diastolic CHF, history of CVA, history of prostate cancer status post radiation and other comorbidities who presents with generalized weakness, recurrent falls along with increased shortness breath leg swelling.  He was evaluated and feel worse admitted for an acute exacerbation of diastolic CHF and is currently being diuresed with IV Lasix 40 mg twice daily.  We will add Xopenex and Atrovent breathing treatments and repeat chest x-ray in the a.m.  Guaifenesin 1200 mg p.o. twice daily, along with a Flutter Valve and Incentive Spirometry.  Echocardiogram is currently being repeated.  We will continue to monitor patient's response to clinical intervention and repeat blood work and chest x-ray in the a.m.

## 2017-10-20 NOTE — Care Management Note (Signed)
Case Management Note  Patient Details  Name: Charles Friedman MRN: [id number] Date of Birth: 12-12-1932  Subjective/Objective:    CHF               Action/Plan: Patient lives at home; PCP: Tisovec, Fransico Him, MD; has private insurance with Medicare; patient is for possible SNF placement; CM will continue to follow for progression of care.  Expected Discharge Date:    possibly 10/24/2017              Expected Discharge Plan:  [place] Discharge planning Services  CM Consult  Status of Service:  In process, will continue to follow  Sherrilyn Rist [phone] 10/20/2017, 12:10 PM

## 2017-10-20 NOTE — Evaluation (Signed)
Physical Therapy Evaluation Patient Details Name: Charles Friedman MRN: [id number] DOB: [birth year] Today's Date: 10/20/2017   History of Present Illness  84yo male with c/o weakness, falls, SOB, B LE edema. Ct negative for acute intracranial changes, chest x-ray positive for cardiomegaly with vascular congestion. Diagnosed with acute on chronic CHF, renal insufficiency. PMH A-fib, B cataracts, DM, glaucoma, diabetic neuropathy, prostate CA   Clinical Impression   Patient received in bed, pleasant but A&Ox1 and reporting he is currently in [facility]affle house and would like to use the men's room. Reoriented patient as appropriate during session. He was able to perform functional bed mobility with MinA, elevated HOB, and extended time, VC and TC for initiation and fully scooting to EOB. Attempted sit to stand with MaxA and RW from elevated surface, patient very unsteady and was returned to sitting due to safety concerns. Attempted to use urinal at EOB, PT placed urinal in correct position for patient multiple times, however without Max tactile cues patient continues to move urinal and ended up urinating on floor multiple times this morning. He was able to perform sit to stand from elevated surface with ModA in bari-Stedy, totalA for pivot transfer over to recliner in [place]. He was left up in the chair with all needs met, chair alarm activated this morning, nursing staff educated regarding safety concerns and mobility status. He will continue to benefit from skilled PT services in the acute setting, but does not appear to be safe to return home alone, thus strongly recommend ST-SNF to assist in addressing functional deficits moving forward.     Follow Up Recommendations SNF;Supervision/Assistance - 24 hour    Equipment Recommendations  Other (comment)(defer to next venue )    Recommendations for Other Services       Precautions / Restrictions Precautions Precautions: Fall Restrictions Weight Bearing  Restrictions: No      Mobility  Bed Mobility Overal bed mobility: Needs Assistance Bed Mobility: Supine to Sit     Supine to sit: Min assist     General bed mobility comments: MinA and extended time to come to EOB, VC and TC to scoot fully to EOB   Transfers Overall transfer level: Needs assistance Equipment used: Rolling walker (2 wheeled) Transfers: Sit to/from [facility] Sit to Stand: Max assist;From elevated surface Stand pivot transfers: Total assist;From elevated surface       General transfer comment: MaxA to come to full standing position from elevated bed with RW, posterior lean and very unsteady even in static stance. Able to stand in bari-St[place] with ModA and pulling up from crossbar, totaA in [place] to pivot to chair   Ambulation/Gait             General Gait Details: DNT due to safety concerns with assist of just +1   Stairs            Wheelchair Mobility    Modified Rankin (Stroke Patients Only)       Balance Overall balance assessment: Needs assistance;History of Falls Sitting-balance support: Bilateral upper extremity supported;Feet supported Sitting balance-Leahy Scale: Fair Sitting balance - Comments: occasional posterior lean, able to correct with VCs  Postural control: Posterior lean   Standing balance-Leahy Scale: Poor Standing balance comment: very unsteady and moderate posterior lean even with RW                              Pertinent Vitals/Pain Pain Assessment: Faces Pain Score:  0-No pain Faces Pain Scale: No hurt Pain Intervention(s): Limited activity within patient's tolerance;Monitored during session    [place] expects to be discharged to:: Private residence Living Arrangements: Alone Available Help at Discharge: Family;Available PRN/intermittently Type of Home: House Home Access: Stairs to enter Entrance Stairs-Rails: Right Entrance Stairs-Number of Steps: 5 Home  Layout: Multi-level;Laundry or work area in [place]: Environmental consultant - 2 wheels;Shower seat;Bedside commode;Grab bars - tub/shower;Grab bars - toilet Additional Comments: patient unreliable historian this morning and family not present to provide further information/details; PLOF and equipment history taken from prior charting     Prior Function Level of Independence: Independent with assistive device(s)         Comments: patient unreliable historian, family not present to elaborate/provide further details      Hand Dominance        Extremity/Trunk Assessment   Upper Extremity Assessment Upper Extremity Assessment: Defer to OT evaluation    Lower Extremity Assessment Lower Extremity Assessment: Generalized weakness    Cervical / Trunk Assessment Cervical / Trunk Assessment: Normal  Communication   Communication: No difficulties  Cognition Arousal/Alertness: Awake/alert Behavior During Therapy: Flat affect;Impulsive Overall Cognitive Status: Impaired/Different from baseline Area of Impairment: Orientation;Attention;Memory;Following commands;Safety/judgement;Awareness;Problem solving                 Orientation Level: Disoriented to;Place;Time;Situation Current Attention Level: Sustained Memory: Decreased short-term memory Following Commands: Follows one step commands inconsistently Safety/Judgement: Decreased awareness of safety;Decreased awareness of deficits Awareness: Intellectual Problem Solving: Slow processing;Decreased initiation;Difficulty sequencing;Requires verbal cues;Requires tactile cues General Comments: states "I am in a waffle house" and was quite amazed to find he was in [facility] Comments      Exercises     Assessment/Plan    PT Assessment Patient needs continued PT services  PT Problem List Decreased strength;Decreased cognition;Decreased knowledge of use of DME;Obesity;Decreased safety awareness;Decreased  balance;Decreased mobility;Decreased coordination       PT Treatment Interventions DME instruction;Balance training;Gait training;Neuromuscular re-education;Stair training;Functional mobility training;Patient/family education;Therapeutic activities;Therapeutic exercise    PT Goals (Current goals can be found in the Care Plan section)  Acute Rehab PT Goals Patient Stated Goal: to go home  PT Goal Formulation: With patient Time For Goal Achievement: 11/03/17 Potential to Achieve Goals: Fair    Frequency Min 2X/week   Barriers to discharge        Co-evaluation               AM-PAC PT "6 Clicks" Daily Activity  Outcome Measure Difficulty turning over in bed (including adjusting bedclothes, sheets and blankets)?: A Little Difficulty moving from lying on back to sitting on the side of the bed? : A Little Difficulty sitting down on and standing up from a chair with arms (e.g., wheelchair, bedside commode, etc,.)?: A Lot Help needed moving to and from a bed to chair (including a wheelchair)?: Total Help needed walking in hospital room?: Total Help needed climbing 3-5 steps with a railing? : Total 6 Click Score: 11    End of Session Equipment Utilized During Treatment: Gait belt Activity Tolerance: Patient tolerated treatment well Patient left: in chair;with call bell/phone within reach;with chair alarm set Nurse Communication: Mobility status;Need for lift equipment PT Visit Diagnosis: Unsteadiness on feet (R26.81);Difficulty in walking, not elsewhere classified (R26.2);Muscle weakness (generalized) (M62.81);History of falling (Z91.81)    Time: 5790-3833 PT Time Calculation (min) (ACUTE ONLY): 38 min   Charges:   PT Evaluation $PT Eval Moderate  Complexity: 1 Mod PT Treatments $Therapeutic Activity: 23-37 mins        Deniece Ree PT, DPT, CBIS  Supplemental Physical Therapist [place]   Pager [phone]

## 2017-10-21 ENCOUNTER — Other Ambulatory Visit: Payer: Self-pay

## 2017-10-21 ENCOUNTER — Inpatient Hospital Stay (HOSPITAL_COMMUNITY): Payer: Medicare Other

## 2017-10-21 DIAGNOSIS — I481 Persistent atrial fibrillation: Secondary | ICD-10-CM

## 2017-10-21 DIAGNOSIS — N189 Chronic kidney disease, unspecified: Secondary | ICD-10-CM

## 2017-10-21 DIAGNOSIS — I5033 Acute on chronic diastolic (congestive) heart failure: Principal | ICD-10-CM

## 2017-10-21 DIAGNOSIS — N179 Acute kidney failure, unspecified: Secondary | ICD-10-CM

## 2017-10-21 DIAGNOSIS — R296 Repeated falls: Secondary | ICD-10-CM

## 2017-10-21 DIAGNOSIS — Z8673 Personal history of transient ischemic attack (TIA), and cerebral infarction without residual deficits: Secondary | ICD-10-CM

## 2017-10-21 LAB — COMPREHENSIVE METABOLIC PANEL
ALBUMIN: 2.8 g/dL — AB (ref 3.5–5.0)
ALK PHOS: 107 U/L (ref 38–126)
ALT: 13 U/L (ref 0–44)
ANION GAP: 9 (ref 5–15)
AST: 18 U/L (ref 15–41)
BILIRUBIN TOTAL: 1.2 mg/dL (ref 0.3–1.2)
BUN: 46 mg/dL — AB (ref 8–23)
CO2: 24 mmol/L (ref 22–32)
Calcium: 8.1 mg/dL — ABNORMAL LOW (ref 8.9–10.3)
Chloride: 105 mmol/L (ref 98–111)
Creatinine, Ser: 1.5 mg/dL — ABNORMAL HIGH (ref 0.61–1.24)
GFR calc non Af Amer: 41 mL/min — ABNORMAL LOW (ref >60)
GFR, EST AFRICAN AMERICAN: 48 mL/min — AB (ref >60)
GLUCOSE: 168 mg/dL — AB (ref 70–99)
Potassium: 4.2 mmol/L (ref 3.5–5.1)
Sodium: 138 mmol/L (ref 135–145)
Total Protein: 6.2 g/dL — ABNORMAL LOW (ref 6.5–8.1)

## 2017-10-21 LAB — CBC WITH DIFFERENTIAL/PLATELET
ABS IMMATURE GRANULOCYTES: 0 10*3/uL (ref 0.0–0.1)
Basophils Absolute: 0.1 10*3/uL (ref 0.0–0.1)
Basophils Relative: 1 %
EOS PCT: 9 %
Eosinophils Absolute: 0.8 10*3/uL — ABNORMAL HIGH (ref 0.0–0.7)
HEMATOCRIT: 30.3 % — AB (ref 39.0–52.0)
HEMOGLOBIN: 9.7 g/dL — AB (ref 13.0–17.0)
Immature Granulocytes: 0 %
LYMPHS ABS: 1.8 10*3/uL (ref 0.7–4.0)
LYMPHS PCT: 21 %
MCH: 30.1 pg (ref 26.0–34.0)
MCHC: 32 g/dL (ref 30.0–36.0)
MCV: 94.1 fL (ref 78.0–100.0)
Monocytes Absolute: 0.9 10*3/uL (ref 0.1–1.0)
Monocytes Relative: 10 %
NEUTROS ABS: 5.2 10*3/uL (ref 1.7–7.7)
Neutrophils Relative %: 59 %
Platelets: 209 10*3/uL (ref 150–400)
RBC: 3.22 MIL/uL — ABNORMAL LOW (ref 4.22–5.81)
RDW: 13.3 % (ref 11.5–15.5)
WBC: 8.7 10*3/uL (ref 4.0–10.5)

## 2017-10-21 LAB — GLUCOSE, CAPILLARY
GLUCOSE-CAPILLARY: 158 mg/dL — AB (ref 70–99)
GLUCOSE-CAPILLARY: 230 mg/dL — AB (ref 70–99)
Glucose-Capillary: 271 mg/dL — ABNORMAL HIGH (ref 70–99)
Glucose-Capillary: 258 mg/dL — ABNORMAL HIGH (ref 70–99)

## 2017-10-21 LAB — PHOSPHORUS: Phosphorus: 3.6 mg/dL (ref 2.5–4.6)

## 2017-10-21 LAB — UREA NITROGEN, URINE: Urea Nitrogen, Ur: 380 mg/dL

## 2017-10-21 LAB — MAGNESIUM: Magnesium: 2.1 mg/dL (ref 1.7–2.4)

## 2017-10-21 MED ORDER — LEVALBUTEROL HCL 0.63 MG/3ML IN NEBU
0.6300 mg | INHALATION_SOLUTION | Freq: Three times a day (TID) | RESPIRATORY_TRACT | Status: DC
  Administered 2017-10-22 (×3): 0.63 mg via RESPIRATORY_TRACT
  Filled 2017-10-21 (×3): qty 3

## 2017-10-21 MED ORDER — INSULIN GLARGINE 100 UNIT/ML ~~LOC~~ SOLN
20.0000 [IU] | Freq: Every day | SUBCUTANEOUS | Status: DC
  Administered 2017-10-21: 20 [IU] via SUBCUTANEOUS
  Filled 2017-10-21 (×4): qty 0.2

## 2017-10-21 MED ORDER — IPRATROPIUM BROMIDE 0.02 % IN SOLN
0.5000 mg | Freq: Three times a day (TID) | RESPIRATORY_TRACT | Status: DC
  Administered 2017-10-22 (×3): 0.5 mg via RESPIRATORY_TRACT
  Filled 2017-10-21 (×3): qty 2.5

## 2017-10-21 NOTE — Discharge Instructions (Addendum)
Information on my medicine - XARELTO® (Rivaroxaban) ° °Why was Xarelto® prescribed for you? °Xarelto® was prescribed for you to reduce the risk of a blood clot forming that can cause a stroke if you have a medical condition called atrial fibrillation (a type of irregular heartbeat). ° °What do you need to know about xarelto® ? °Take your Xarelto® ONCE DAILY at the same time every day with your evening meal. °If you have difficulty swallowing the tablet whole, you may crush it and mix in applesauce just prior to taking your dose. ° °Take Xarelto® exactly as prescribed by your doctor and DO NOT stop taking Xarelto® without talking to the doctor who prescribed the medication.  Stopping without other stroke prevention medication to take the place of Xarelto® may increase your risk of developing a clot that causes a stroke.  Refill your prescription before you run out. ° °After discharge, you should have regular check-up appointments with your healthcare provider that is prescribing your Xarelto®.  In the future your dose may need to be changed if your kidney function or weight changes by a significant amount. ° °What do you do if you miss a dose? °If you are taking Xarelto® ONCE DAILY and you miss a dose, take it as soon as you remember on the same day then continue your regularly scheduled once daily regimen the next day. Do not take two doses of Xarelto® at the same time or on the same day.  ° °Important Safety Information °A possible side effect of Xarelto® is bleeding. You should call your healthcare provider right away if you experience any of the following: °? Bleeding from an injury or your nose that does not stop. °? Unusual colored urine (red or dark brown) or unusual colored stools (red or black). °? Unusual bruising for unknown reasons. °? A serious fall or if you hit your head (even if there is no bleeding). ° °Some medicines may interact with Xarelto® and might increase your risk of bleeding while on  Xarelto®. To help avoid this, consult your healthcare provider or pharmacist prior to using any new prescription or non-prescription medications, including herbals, vitamins, non-steroidal anti-inflammatory drugs (NSAIDs) and supplements. ° °This website has more information on Xarelto®: www.xarelto.com. ° ° ° °Additional discharge instructions ° °Please get your medications reviewed and adjusted by your Primary MD. ° °Please request your Primary MD to go over all Hospital Tests and Procedure/Radiological results at the follow up, please get all Hospital records sent to your Prim MD by signing hospital release before you go home. ° °If you had Pneumonia of Lung problems at the Hospital: °Please get a 2 view Chest X ray done in 6-8 weeks after hospital discharge or sooner if instructed by your Primary MD. ° °If you have Congestive Heart Failure: °Please call your Cardiologist or Primary MD anytime you have any of the following symptoms:  °1) 3 pound weight gain in 24 hours or 5 pounds in 1 week  °2) shortness of breath, with or without a dry hacking cough  °3) swelling in the hands, feet or stomach  °4) if you have to sleep on extra pillows at night in order to breathe ° °Follow cardiac low salt diet and 1.5 lit/day fluid restriction. ° °If you have diabetes °Accuchecks 4 times/day, Once in AM empty stomach and then before each meal. °Log in all results and show them to your primary doctor at your next visit. °If any glucose reading is under 80 or above 300   call your primary MD immediately. ° °If you have Seizure/Convulsions/Epilepsy: °Please do not drive, operate heavy machinery, participate in activities at heights or participate in high speed sports until you have seen by Primary MD or a Neurologist and advised to do so again. ° °If you had Gastrointestinal Bleeding: °Please ask your Primary MD to check a complete blood count within one week of discharge or at your next visit. Your endoscopic/colonoscopic biopsies  that are pending at the time of discharge, will also need to followed by your Primary MD. ° °Get Medicines reviewed and adjusted. °Please take all your medications with you for your next visit with your Primary MD ° °Please request your Primary MD to go over all hospital tests and procedure/radiological results at the follow up, please ask your Primary MD to get all Hospital records sent to his/her office. ° °If you experience worsening of your admission symptoms, develop shortness of breath, life threatening emergency, suicidal or homicidal thoughts you must seek medical attention immediately by calling 911 or calling your MD immediately  if symptoms less severe. ° °You must read complete instructions/literature along with all the possible adverse reactions/side effects for all the Medicines you take and that have been prescribed to you. Take any new Medicines after you have completely understood and accpet all the possible adverse reactions/side effects.  ° °Do not drive or operate heavy machinery when taking Pain medications.  ° °Do not take more than prescribed Pain, Sleep and Anxiety Medications ° °Special Instructions: If you have smoked or chewed Tobacco  in the last 2 yrs please stop smoking, stop any regular Alcohol  and or any Recreational drug use. ° °Wear Seat belts while driving. ° °Please note °You were cared for by a hospitalist during your hospital stay. If you have any questions about your discharge medications or the care you received while you were in the hospital after you are discharged, you can call the unit and asked to speak with the hospitalist on call if the hospitalist that took care of you is not available. Once you are discharged, your primary care physician will handle any further medical issues. Please note that NO REFILLS for any discharge medications will be authorized once you are discharged, as it is imperative that you return to your primary care physician (or establish a  relationship with a primary care physician if you do not have one) for your aftercare needs so that they can reassess your need for medications and monitor your lab values. ° °You can reach the hospitalist office at phone [phone] or fax [phone] °  °If you do not have a primary care physician, you can call [phone] for a physician referral. ° ° °

## 2017-10-21 NOTE — Progress Notes (Signed)
PROGRESS NOTE   Charles Friedman  NWG:[id number]    DOB: 1932-08-25    DOA: 10/19/2017  PCP: Haywood Pao, MD   I have briefly reviewed patients previous medical records in [facility].  Brief Narrative:  84 year old male, lives alone, ambulates with the help of a walker, has a caregiver to assist, with PMH of A. fib on Xarelto, DM 2/IDDM, HLD, chronic diastolic CHF, CVA, prostate cancer status post radiation presented to ED with complaints of generalized weakness, recurrent falls, dyspnea and progressive lower extremity edema.  He had been evaluated in the ED a few days ago after a fall with skin tears and right ear injury/hematoma and had been seen by ENT.  Noncompliant with prescribed Lasix.  He was admitted for acute on chronic diastolic CHF, generalized weakness with frequent falls and acute on chronic kidney disease.  Slowly improving.   Assessment & Plan:   Principal Problem:   Acute on chronic diastolic CHF (congestive heart failure) (HCC) Active Problems:   Diabetes mellitus without complication (HCC)   Persistent atrial fibrillation (HCC)   History of CVA (cerebrovascular accident)   Renal insufficiency   Generalized weakness   Recurrent falls   Acute on chronic diastolic CHF: Precipitated by noncompliance with prescribed Lasix 40 mg daily.  TTE 9/3: LVEF 50-55%.  Started on IV Lasix 40 mg twice daily.  -772 mL since admission but not sure if this is accurate.  Discussed with RN, continue current dose of Lasix, strict intake and output and daily weights.  Continue metoprolol 12.5 mg twice daily.  Acute on suspected chronic kidney disease: No recent labs to know baseline.  Last creatinine was 1.08 in 2017.  Presented with creatinine of 1.64 on admission.  This has improved to 1.5.  Some of this may be due to poor perfusion from CHF.  Follow daily BMP.  Debility and frequent falls: As per report, has been generally weak lately and has fallen multiple times sustaining injury to  right ear including hematoma that did not require suture or surgery and suffering minor skin tears.  CT head without acute findings.  PT eval and suspect may need SNF level of care.  Chronic A. Fib: CHADS-VASc 6 (age x2, CVA x2, DM, CHF).  Controlled ventricular rate.  Continue metoprolol 12.5 mg twice daily and Xarelto.  Type II DM with renal complications: V7Q 4.69 years ago.  Mildly uncontrolled on current doses of Lantus and SSI.  Increase Lantus to 20 units at bedtime.  Monitor closely.  Check repeat A1c.   History of CVA: CT head without acute findings.  No focal deficits.  Continue statins and Xarelto.  Hyperlipidemia: Statins.  Anemia: May be due to chronic kidney disease.  Follow CBC in a.m.   DVT prophylaxis: Currently on Xarelto. Code Status: Full Family Communication: Discussed in detail with patient's caregiver at bedside.  Updated care and answered questions. Disposition: To be determined pending clinical improvement and PT evaluation.   Consultants:  None  Procedures:  None  Antimicrobials:  None   Subjective: Overall feeling better.  Dyspnea improved but breathing not yet at baseline.  Feels stronger.  No pain reported.  No palpitations.  ROS: As above, otherwise negative.  As per caregiver, does not indulge in increased salt in diet.  Objective:  Vitals:   10/21/17 0652 10/21/17 0744 10/21/17 1134 10/21/17 1340  BP: 121/68  105/65   Pulse: 97  92   Resp: 18  17   Temp: 98.5 F (  36.9 C)  99.3 F (37.4 C)   TempSrc: Oral  Oral   SpO2: 92% 99% 94% 96%  Weight:      Height:        Examination:  General exam: Pleasant elderly male, moderately built and obese, sitting up comfortably in bed. Respiratory system: Few fine bibasilar crackles, otherwise clear to auscultation. Respiratory effort normal. Cardiovascular system: S1 & S2 heard, irreg irregular.  No JVD or murmurs.  1+ pitting bilateral leg edema.  Telemetry personally reviewed: A. fib with  controlled ventricular rate.  Occasional RVR in the 140s. Gastrointestinal system: Abdomen is nondistended, soft and nontender. No organomegaly or masses felt. Normal bowel sounds heard. Central nervous system: Alert and oriented. No focal neurological deficits. Extremities: Symmetric 5 x 5 power. Skin: No rashes, lesions or ulcers Psychiatry: Judgement and insight appear impaired. Mood & affect appropriate.  ENT: Right external ear/pinna hematoma without active bleeding.   Data Reviewed: I have personally reviewed following labs and imaging studies  CBC: Recent Labs  Lab 10/20/17 0033 10/21/17 0409  WBC 11.8* 8.7  NEUTROABS  --  5.2  HGB 11.1* 9.7*  HCT 34.6* 30.3*  MCV 94.3 94.1  PLT 227 606   Basic Metabolic Panel: Recent Labs  Lab 10/20/17 0033 10/21/17 0409  NA 137 138  K 4.1 4.2  CL 105 105  CO2 22 24  GLUCOSE 136* 168*  BUN 38* 46*  CREATININE 1.64* 1.50*  CALCIUM 8.6* 8.1*  MG  --  2.1  PHOS  --  3.6   Liver Function Tests: Recent Labs  Lab 10/21/17 0409  AST 18  ALT 13  ALKPHOS 107  BILITOT 1.2  PROT 6.2*  ALBUMIN 2.8*   HbA1C: No results for input(s): HGBA1C in the last 72 hours. CBG: Recent Labs  Lab 10/20/17 1648 10/20/17 2134 10/21/17 0812 10/21/17 1132 10/21/17 1644  GLUCAP 175* 227* 158* 271* 258*    No results found for this or any previous visit (from the past 240 hour(s)).       Radiology Studies: Dg Chest 2 View  Result Date: 10/21/2017 CLINICAL DATA:  Cough and shortness of breath for 4 days. EXAM: CHEST - 2 VIEW COMPARISON:  10/20/2017. FINDINGS: Trachea is midline. Heart is enlarged. Mild interstitial prominence and indistinctness are seen diffusely. Mild bibasilar subsegmental atelectasis. No pleural fluid. Degenerative changes in the spine. IMPRESSION: 1. Pulmonary edema. 2. Bibasilar atelectasis. Electronically Signed   By: Lorin Picket M.D.   On: 10/21/2017 08:10   Ct Head Wo Contrast  Result Date:  10/20/2017 CLINICAL DATA:  [place] twice today. Generalized weakness. History of diabetes, prostate cancer, hyperlipidemia, stroke. EXAM: CT HEAD WITHOUT CONTRAST TECHNIQUE: Contiguous axial images were obtained from the base of the skull through the vertex without intravenous contrast. COMPARISON:  CT HEAD October 16, 2017 FINDINGS: BRAIN: No intraparenchymal hemorrhage, mass effect nor midline shift. The ventricles and sulci are normal for age. Patchy supratentorial white matter hypodensities within normal range for patient's age, though non-specific are most compatible with chronic small vessel ischemic disease. Old small bifrontal lobe infarcts. No acute large vascular territory infarcts. No abnormal extra-axial fluid collections. Basal cisterns are patent. VASCULAR: Mild calcific atherosclerosis of the carotid siphons. SKULL: No skull fracture. No significant scalp soft tissue swelling. RIGHT ear thickening at site of prior hemorrhage/laceration. SINUSES/ORBITS: Mild paranasal sinus mucosal thickening. Mastoid air cells are well aerated.The included ocular globes and orbital contents are non-suspicious. Status post bilateral ocular lens implants. OTHER: Multiple dental caries.  IMPRESSION: 1. No acute intracranial process. 2. Stable examination including old small bifrontal lobe infarcts and moderate chronic small vessel ischemic changes. Electronically Signed   By: Elon Alas M.D.   On: 10/20/2017 01:59   Dg Chest Portable 1 View  Result Date: 10/20/2017 CLINICAL DATA:  Shortness of breath EXAM: PORTABLE CHEST 1 VIEW COMPARISON:  November 25, 2015 FINDINGS: Cardiomegaly and pulmonary venous congestion. No pneumothorax. No other acute abnormalities. IMPRESSION: Cardiomegaly and pulmonary venous congestion. Electronically Signed   By: Dorise Bullion III M.D   On: 10/20/2017 00:27        Scheduled Meds: . atorvastatin  40 mg Oral q1800  . escitalopram  5 mg Oral Daily  . furosemide  40 mg Intravenous  BID  . guaiFENesin  1,200 mg Oral BID  . insulin aspart  0-5 Units Subcutaneous QHS  . insulin aspart  0-9 Units Subcutaneous TID WC  . insulin glargine  18 Units Subcutaneous QHS  . ipratropium  0.5 mg Nebulization Q6H  . levalbuterol  0.63 mg Nebulization Q6H  . metoprolol tartrate  12.5 mg Oral BID  . pregabalin  75 mg Oral BID  . rivaroxaban  15 mg Oral Q supper  . sodium chloride flush  3 mL Intravenous Q12H  . tamsulosin  0.4 mg Oral Daily   Continuous Infusions: . sodium chloride       LOS: 1 day     Vernell Leep, MD, FACP, [facility]. [facility] Pager [phone]  If 7PM-7AM, please contact night-coverage [email or web address] Password [email or web address] 10/21/2017, 5:17 PM

## 2017-10-22 DIAGNOSIS — E119 Type 2 diabetes mellitus without complications: Secondary | ICD-10-CM

## 2017-10-22 LAB — CBC
HEMATOCRIT: 31 % — AB (ref 39.0–52.0)
HEMOGLOBIN: 10.2 g/dL — AB (ref 13.0–17.0)
MCH: 30.4 pg (ref 26.0–34.0)
MCHC: 32.9 g/dL (ref 30.0–36.0)
MCV: 92.5 fL (ref 78.0–100.0)
Platelets: 226 10*3/uL (ref 150–400)
RBC: 3.35 MIL/uL — ABNORMAL LOW (ref 4.22–5.81)
RDW: 13.2 % (ref 11.5–15.5)
WBC: 8.5 10*3/uL (ref 4.0–10.5)

## 2017-10-22 LAB — GLUCOSE, CAPILLARY
GLUCOSE-CAPILLARY: 251 mg/dL — AB (ref 70–99)
Glucose-Capillary: 189 mg/dL — ABNORMAL HIGH (ref 70–99)
Glucose-Capillary: 253 mg/dL — ABNORMAL HIGH (ref 70–99)
Glucose-Capillary: 296 mg/dL — ABNORMAL HIGH (ref 70–99)

## 2017-10-22 LAB — BASIC METABOLIC PANEL
ANION GAP: 9 (ref 5–15)
BUN: 46 mg/dL — ABNORMAL HIGH (ref 8–23)
CHLORIDE: 104 mmol/L (ref 98–111)
CO2: 23 mmol/L (ref 22–32)
Calcium: 8.3 mg/dL — ABNORMAL LOW (ref 8.9–10.3)
Creatinine, Ser: 1.31 mg/dL — ABNORMAL HIGH (ref 0.61–1.24)
GFR calc Af Amer: 56 mL/min — ABNORMAL LOW (ref >60)
GFR calc non Af Amer: 49 mL/min — ABNORMAL LOW (ref >60)
GLUCOSE: 181 mg/dL — AB (ref 70–99)
POTASSIUM: 3.9 mmol/L (ref 3.5–5.1)
Sodium: 136 mmol/L (ref 135–145)

## 2017-10-22 MED ORDER — INSULIN GLARGINE 100 UNIT/ML ~~LOC~~ SOLN
20.0000 [IU] | Freq: Every day | SUBCUTANEOUS | Status: DC
  Administered 2017-10-22 – 2017-10-23 (×2): 20 [IU] via SUBCUTANEOUS
  Filled 2017-10-22 (×2): qty 0.2

## 2017-10-22 MED ORDER — INSULIN ASPART 100 UNIT/ML ~~LOC~~ SOLN
3.0000 [IU] | Freq: Three times a day (TID) | SUBCUTANEOUS | Status: DC
  Administered 2017-10-23 (×2): 3 [IU] via SUBCUTANEOUS

## 2017-10-22 MED ORDER — RIVAROXABAN 20 MG PO TABS: 20.0000 mg | ORAL_TABLET | Freq: Every day | ORAL | Status: DC

## 2017-10-22 MED ORDER — FUROSEMIDE 10 MG/ML IJ SOLN
60.0000 mg | Freq: Two times a day (BID) | INTRAMUSCULAR | Status: DC
  Administered 2017-10-22 – 2017-10-23 (×2): 60 mg via INTRAVENOUS
  Filled 2017-10-22 (×2): qty 6

## 2017-10-22 NOTE — Progress Notes (Signed)
Inpatient Diabetes Program Recommendations  AACE/ADA: New Consensus Statement on Inpatient Glycemic Control (2015)  Target Ranges:  Prepandial:   less than 140 mg/dL      Peak postprandial:   less than 180 mg/dL (1-2 hours)      Critically ill patients:  140 - 180 mg/dL   Lab Results  Component Value Date   GLUCAP 189 (H) 10/22/2017   HGBA1C 8.8 (H) 11/26/2015    Review of Glycemic Control Results for Charles Friedman, Charles Friedman (MRN [id number]) as of 10/22/2017 12:06  Ref. Range 10/21/2017 11:32 10/21/2017 16:44 10/21/2017 21:23 10/22/2017 08:07  Glucose-Capillary Latest Ref Range: 70 - 99 mg/dL 271 (H) 258 (H) 230 (H) 189 (H)   Diabetes history: Type 2 DM Outpatient Diabetes medications: Lantus 35 units QD, Humalog 5 units TID Current orders for Inpatient glycemic control: Lantus 20 units QHS, Novolog 0-9 units TID, Novolog 0-5 units QHS  Inpatient Diabetes Program Recommendations:    If post prandials continue to exceed 180 mg/dL, consider adding Novolog 3 units TID (assuming that patient is consuming >50% of meals).  Thanks, Bronson Curb, MSN, RNC-OB Diabetes Coordinator [phone] (8a-5p)

## 2017-10-22 NOTE — Clinical Social Work Note (Signed)
Clinical Social Work Assessment  Patient Details  Name: Charles Friedman MRN: [id number] Date of Birth: 05-01-1932  Date of referral:  10/22/17               Reason for consult:  Facility Placement, Discharge Planning                Permission sought to share information with:  Facility Sport and exercise psychologist, Advertising account executive) Permission granted to share information::  Yes, Verbal Permission Granted  Name::     Facilities manager::  SNF's  Relationship::  Caregiver  Contact Information:  [phone]  Housing/Transportation Living arrangements for the past 2 months:  Single Family Home Source of Information:  Patient, Medical Team, Other (Comment Required)(Caregiver) Patient Interpreter Needed:  None Criminal Activity/Legal Involvement Pertinent to Current Situation/Hospitalization:  No - Comment as needed Significant Relationships:  Adult Children, Friend, Other(Comment)(Caregiver) Lives with:  Self Do you feel safe going back to the place where you live?  Yes Need for family participation in patient care:  Yes (Comment)  Care giving concerns:  PT recommending SNF once medically stable for discharge.   Social Worker assessment / plan:  CSW met with patient. Caregiver, Danae Chen, at bedside. CSW introduced role and explained that PT recommendations would be discussed. Patient is agreeable to SNF placement. [facility] is first preference. [facility] is second preference. CSW notified [facility] admissions coordinator. She will review referral and notify CSW of decision. No further concerns. CSW encouraged patient and his caregiver to contact CSW as needed. CSW will continue to follow patient and his caregiver for support and facilitate discharge to SNF once medically stable.  Employment status:  Retired Forensic scientist:  Medicare PT Recommendations:  [place] / Referral to community resources:  [place]  Patient/Family's Response to care:   Patient agreeable to SNF placement. Patient's family and caregiver supportive and involved in patient's care. Patient appreciated social work intervention.  Patient/Family's Understanding of and Emotional Response to Diagnosis, Current Treatment, and Prognosis:  Patient and his caregiver have a good understanding of the reason for admission and his need for rehab prior to returning home. Patient appears happy with hospital care.  Emotional Assessment Appearance:  Appears stated age Attitude/Demeanor/Rapport:  Engaged, Gracious Affect (typically observed):  Accepting, Appropriate, Calm, Pleasant Orientation:  Oriented to Self, Oriented to Place, Oriented to  Time, Oriented to Situation Alcohol / Substance use:  Never Used Psych involvement (Current and /or in the community):  No (Comment)  Discharge Needs  Concerns to be addressed:  Care Coordination Readmission within the last 30 days:  No Current discharge risk:  Dependent with Mobility, Lives alone Barriers to Discharge:  Continued Medical Work up   Candie Chroman, LCSW 10/22/2017, 10:20 AM

## 2017-10-22 NOTE — Plan of Care (Signed)
  Problem: Clinical Measurements: Goal: Cardiovascular complication will be avoided Outcome: Progressing   Problem: Nutrition: Goal: Adequate nutrition will be maintained Outcome: Progressing   Problem: Coping: Goal: Level of anxiety will decrease Outcome: Progressing

## 2017-10-22 NOTE — Clinical Social Work Note (Signed)
[facility] is able to offer a bed when patient is stable for discharge. Caregiver notified.  Dayton Scrape, [place]

## 2017-10-22 NOTE — Clinical Social Work Placement (Signed)
   CLINICAL SOCIAL WORK PLACEMENT  NOTE  Date:  10/22/2017  Patient Details  Name: Charles Friedman MRN: [id number] Date of Birth: 1933/01/04  Clinical Social Work is seeking post-discharge placement for this patient at the [place] level of care (*CSW will initial, date and re-position this form in  chart as items are completed):  Yes   Patient/family provided with [place] Work Department's list of facilities offering this level of care within the geographic area requested by the patient (or if unable, by the patient's family).  Yes   Patient/family informed of their freedom to choose among providers that offer the needed level of care, that participate in Medicare, Medicaid or managed care program needed by the patient, have an available bed and are willing to accept the patient.  Yes   Patient/family informed of [facility]'s ownership interest in [facility] and [facility], as well as of the fact that they are under no obligation to receive care at these facilities.  PASRR submitted to EDS on 10/22/17     PASRR number received on 10/22/17     Existing PASRR number confirmed on       FL2 transmitted to all facilities in geographic area requested by pt/family on 10/22/17     FL2 transmitted to all facilities within larger geographic area on       Patient informed that his/her managed care company has contracts with or will negotiate with certain facilities, including the following:            Patient/family informed of bed offers received.  Patient chooses bed at       Physician recommends and patient chooses bed at      Patient to be transferred to   on  .  Patient to be transferred to facility by       Patient family notified on   of transfer.  Name of family member notified:        PHYSICIAN Please sign FL2     Additional Comment:    _______________________________________________ Candie Chroman, LCSW 10/22/2017, 10:23  AM

## 2017-10-22 NOTE — Progress Notes (Signed)
PROGRESS NOTE   Charles Friedman  TML:[id number]    DOB: 15-Apr-1932    DOA: 10/19/2017  PCP: Haywood Pao, MD   I have briefly reviewed patients previous medical records in [facility].  Brief Narrative:  84 year old male, lives alone, ambulates with the help of a walker, has a caregiver to assist, with PMH of A. fib on Xarelto, DM 2/IDDM, HLD, chronic diastolic CHF, CVA, prostate cancer status post radiation presented to ED with complaints of generalized weakness, recurrent falls, dyspnea and progressive lower extremity edema.  He had been evaluated in the ED a few days ago after a fall with skin tears and right ear injury/hematoma and had been seen by ENT.  Noncompliant with prescribed Lasix.  He was admitted for acute on chronic diastolic CHF, generalized weakness with frequent falls and acute on chronic kidney disease.  Slowly improving.   Assessment & Plan:   Principal Problem:   Acute on chronic diastolic CHF (congestive heart failure) (HCC) Active Problems:   Diabetes mellitus without complication (HCC)   Persistent atrial fibrillation (HCC)   History of CVA (cerebrovascular accident)   Renal insufficiency   Generalized weakness   Recurrent falls   Acute on chronic diastolic CHF: Precipitated by noncompliance with prescribed Lasix 40 mg daily.  TTE 9/3: LVEF 50-55%.  Started on IV Lasix 40 mg twice daily.  Continue metoprolol 12.5 mg twice daily.  -1.27 L yesterday but still substantial volume overload.  Increased IV Lasix to 60 mg twice daily.  Continue to monitor closely.  Acute on suspected chronic kidney disease: No recent labs to know baseline.  Last creatinine was 1.08 in 2017.  Presented with creatinine of 1.64 on admission.  Creatinine continues to improve, down to 1.3.  Some of this may be due to poor perfusion from CHF.  Follow daily BMP.  Debility and frequent falls: As per report, has been generally weak lately and has fallen multiple times sustaining injury to  right ear including hematoma that did not require suture or surgery and suffering minor skin tears.  CT head without acute findings.  PT eval and recommend SNF, patient agreeable.  Chronic A. Fib: CHADS-VASc 6 (age x2, CVA x2, DM, CHF).  Controlled ventricular rate.  Continue metoprolol 12.5 mg twice daily and Xarelto, adjust to renal dose.  Type II DM with renal complications: K8L 2.75 years ago.  Mildly uncontrolled on current doses of Lantus and SSI.  Increase Lantus to 22 units at bedtime.  Add mealtime NovoLog 3 units.  Monitor closely.  Check repeat A1c.   History of CVA: CT head without acute findings.  No focal deficits.  Continue statins and Xarelto.  Hyperlipidemia: Statins.  Anemia: May be due to chronic kidney disease.  Stable.  DVT prophylaxis: Currently on Xarelto. Code Status: Full Family Communication: Discussed in detail with patient's caregiver at bedside.  Updated care and answered questions. Disposition: DC to SNF pending clinical improvement.   Consultants:  None  Procedures:  None  Antimicrobials:  None   Subjective: Continues to feel better.  Dyspnea improved but still not at baseline.  No chest pain.  Leg swelling decreasing.  Feeling stronger.  ROS: As above, otherwise negative.  As per caregiver, does not indulge in increased salt in diet.  Objective:  Vitals:   10/22/17 0800 10/22/17 1128 10/22/17 1317 10/22/17 1600  BP: 127/68 127/73  105/62  Pulse: 99 87  75  Resp:  (!) 22    Temp: 99 F (37.2  C) 97.9 F (36.6 C)  98.9 F (37.2 C)  TempSrc: Oral Oral  Oral  SpO2: 93% 95% 95% 97%  Weight:      Height:        Examination:  General exam: Pleasant elderly male, moderately built and obese, sitting up comfortably in chair this morning. Respiratory system: Few fine bibasilar crackles, otherwise clear to auscultation. Respiratory effort normal.  Stable without change. Cardiovascular system: S1 & S2 heard, irreg irregular.  No JVD or murmurs.   1+ pitting bilateral leg edema, somewhat better today.  Telemetry personally reviewed: A. fib with controlled ventricular rate. Gastrointestinal system: Abdomen is nondistended, soft and nontender. No organomegaly or masses felt. Normal bowel sounds heard.  Stable. Central nervous system: Alert and oriented. No focal neurological deficits.  Stable. Extremities: Symmetric 5 x 5 power. Skin: No rashes, lesions or ulcers Psychiatry: Judgement and insight appear impaired. Mood & affect appropriate.  ENT: Right external ear/pinna hematoma without active bleeding.   Data Reviewed: I have personally reviewed following labs and imaging studies  CBC: Recent Labs  Lab 10/20/17 0033 10/21/17 0409 10/22/17 0345  WBC 11.8* 8.7 8.5  NEUTROABS  --  5.2  --   HGB 11.1* 9.7* 10.2*  HCT 34.6* 30.3* 31.0*  MCV 94.3 94.1 92.5  PLT 227 209 024   Basic Metabolic Panel: Recent Labs  Lab 10/20/17 0033 10/21/17 0409 10/22/17 0345  NA 137 138 136  K 4.1 4.2 3.9  CL 105 105 104  CO2 22 24 23   GLUCOSE 136* 168* 181*  BUN 38* 46* 46*  CREATININE 1.64* 1.50* 1.31*  CALCIUM 8.6* 8.1* 8.3*  MG  --  2.1  --   PHOS  --  3.6  --    Liver Function Tests: Recent Labs  Lab 10/21/17 0409  AST 18  ALT 13  ALKPHOS 107  BILITOT 1.2  PROT 6.2*  ALBUMIN 2.8*   HbA1C: No results for input(s): HGBA1C in the last 72 hours. CBG: Recent Labs  Lab 10/21/17 1644 10/21/17 2123 10/22/17 0807 10/22/17 1220 10/22/17 1653  GLUCAP 258* 230* 189* 253* 296*    No results found for this or any previous visit (from the past 240 hour(s)).       Radiology Studies: Dg Chest 2 View  Result Date: 10/21/2017 CLINICAL DATA:  Cough and shortness of breath for 4 days. EXAM: CHEST - 2 VIEW COMPARISON:  10/20/2017. FINDINGS: Trachea is midline. Heart is enlarged. Mild interstitial prominence and indistinctness are seen diffusely. Mild bibasilar subsegmental atelectasis. No pleural fluid. Degenerative changes in the  spine. IMPRESSION: 1. Pulmonary edema. 2. Bibasilar atelectasis. Electronically Signed   By: Lorin Picket M.D.   On: 10/21/2017 08:10        Scheduled Meds: . atorvastatin  40 mg Oral q1800  . escitalopram  5 mg Oral Daily  . furosemide  60 mg Intravenous BID  . guaiFENesin  1,200 mg Oral BID  . insulin aspart  0-5 Units Subcutaneous QHS  . insulin aspart  0-9 Units Subcutaneous TID WC  . insulin glargine  20 Units Subcutaneous Daily  . ipratropium  0.5 mg Nebulization TID  . levalbuterol  0.63 mg Nebulization TID  . metoprolol tartrate  12.5 mg Oral BID  . pregabalin  75 mg Oral BID  . rivaroxaban  15 mg Oral Q supper  . sodium chloride flush  3 mL Intravenous Q12H  . tamsulosin  0.4 mg Oral Daily   Continuous Infusions: . sodium chloride  LOS: 2 days     Vernell Leep, MD, FACP, [facility] Pager [phone]  If 7PM-7AM, please contact night-coverage [email or web address] Password [facility] 10/22/2017, 5:32 PM

## 2017-10-22 NOTE — NC FL2 (Signed)
[place] MEDICAID FL2 LEVEL OF CARE SCREENING TOOL     IDENTIFICATION  Patient Name: Charles Friedman Birthdate: 1932-08-09 Sex: male Admission Date (Current Location): 10/19/2017  [facility] and [place] Number:  Herbalist and Address:  [facility], [place] [address], [place], [place] [address]      Provider Number: [id number]  Attending Physician Name and Address:  Modena Jansky, MD  Relative Name and Phone Number:       Current Level of Care: Hospital Recommended Level of Care: [place] Prior Approval Number:    Date Approved/Denied:   PASRR Number: [id number]  Discharge Plan: SNF    Current Diagnoses: Patient Active Problem List   Diagnosis Date Noted  . Renal insufficiency 10/20/2017  . Generalized weakness 10/20/2017  . Recurrent falls 10/20/2017  . Bilateral edema of lower extremity 04/10/2017  . Acute on chronic diastolic CHF (congestive heart failure) ([place]) 04/10/2017  . Stable angina ([place]) 01/14/2017  . Hyperlipidemia with target LDL less than 100 01/22/2016  . Abnormality of gait 01/22/2016  . History of CVA (cerebrovascular accident) 11/26/2015  . TIA (transient ischemic attack) 11/25/2015  . Cerebral infarction ([place]) 11/25/2015  . Stroke (cerebrum) ([place]) 11/25/2015  . Type 2 diabetes mellitus with hyperglycemia ([place]) 07/15/2014  . UTI (lower urinary tract infection) 07/15/2014  . Fall at home 07/15/2014  . Closed rib fracture 07/15/2014  . Dehydration, mild 07/15/2014  . Diabetes mellitus without complication ([place])   . Hx of radiation therapy   . Persistent atrial fibrillation ([place])   . Prostate cancer ([place]) 01/29/2012    Orientation RESPIRATION BLADDER Height & Weight     Self, Time, Situation, Place  O2(Nasal Canula 2 L) Continent Weight: 236 lb (107 kg) Height:  6\' 1"  (185.4 cm)  BEHAVIORAL SYMPTOMS/MOOD NEUROLOGICAL BOWEL NUTRITION STATUS  (None) (History of CVA) Continent Diet(Heart healthy/carb  modified. Fluid restriction 1500 mL.)  AMBULATORY STATUS COMMUNICATION OF NEEDS Skin   Extensive Assist Verbally Skin abrasions, Bruising, Other (Comment)(Skin tear. Small hematoma on right ear: No dressing.)                       Personal Care Assistance Level of Assistance              Functional Limitations Info  Sight, Hearing, Speech Sight Info: Adequate Hearing Info: Adequate Speech Info: Adequate    SPECIAL CARE FACTORS FREQUENCY  PT (By licensed PT)     PT Frequency: 5 x week              Contractures Contractures Info: Not present    Additional Factors Info  Code Status, Allergies Code Status Info: Full Allergies Info: Aureomycin (Chlortetracycline).           Current Medications (10/22/2017):  This is the current hospital active medication list Current Facility-Administered Medications  Medication Dose Route Frequency Provider Last Rate Last Dose  . 0.9 %  sodium chloride infusion  250 mL Intravenous PRN Opyd, Ilene Qua, MD      . acetaminophen (TYLENOL) tablet 650 mg  650 mg Oral Q4H PRN Opyd, Ilene Qua, MD      . atorvastatin (LIPITOR) tablet 40 mg  40 mg Oral q1800 Opyd, Ilene Qua, MD   40 mg at 10/21/17 1720  . escitalopram (LEXAPRO) tablet 5 mg  5 mg Oral Daily Opyd, Ilene Qua, MD   5 mg at 10/22/17 0941  . furosemide (LASIX) injection 40 mg  40 mg Intravenous BID Vianne Bulls, MD   40 mg at 10/22/17 2863  . guaiFENesin (MUCINEX) 12 hr tablet 1,200 mg  1,200 mg Oral BID Raiford Noble Griswold, DO   1,200 mg at 10/22/17 0941  . insulin aspart (novoLOG) injection 0-5 Units  0-5 Units Subcutaneous QHS Vianne Bulls, MD   2 Units at 10/21/17 2208  . insulin aspart (novoLOG) injection 0-9 Units  0-9 Units Subcutaneous TID WC Opyd, Ilene Qua, MD   2 Units at 10/22/17 [phone]  . insulin glargine (LANTUS) injection 20 Units  20 Units Subcutaneous QHS Modena Jansky, MD   20 Units at 10/21/17 2249  . ipratropium (ATROVENT) nebulizer solution 0.5 mg  0.5  mg Nebulization TID Modena Jansky, MD   0.5 mg at 10/22/17 0715  . levalbuterol (XOPENEX) nebulizer solution 0.63 mg  0.63 mg Nebulization TID Modena Jansky, MD   0.63 mg at 10/22/17 0715  . metoprolol tartrate (LOPRESSOR) tablet 12.5 mg  12.5 mg Oral BID Opyd, Ilene Qua, MD   12.5 mg at 10/22/17 0942  . ondansetron (ZOFRAN) injection 4 mg  4 mg Intravenous Q6H PRN Opyd, Ilene Qua, MD      . pregabalin (LYRICA) capsule 75 mg  75 mg Oral BID Opyd, Ilene Qua, MD   75 mg at 10/22/17 0942  . Rivaroxaban (XARELTO) tablet 15 mg  15 mg Oral Q supper Opyd, Ilene Qua, MD   15 mg at 10/21/17 1720  . sodium chloride flush (NS) 0.9 % injection 3 mL  3 mL Intravenous Q12H Opyd, Ilene Qua, MD   3 mL at 10/22/17 0946  . sodium chloride flush (NS) 0.9 % injection 3 mL  3 mL Intravenous PRN Opyd, Ilene Qua, MD      . tamsulosin (FLOMAX) capsule 0.4 mg  0.4 mg Oral Daily Opyd, Ilene Qua, MD   0.4 mg at 10/22/17 1165     Discharge Medications: Please see discharge summary for a list of discharge medications.  Relevant Imaging Results:  Relevant Lab Results:   Additional Information SS#: [id number]  Candie Chroman, LCSW

## 2017-10-23 DIAGNOSIS — R278 Other lack of coordination: Secondary | ICD-10-CM | POA: Diagnosis not present

## 2017-10-23 DIAGNOSIS — E785 Hyperlipidemia, unspecified: Secondary | ICD-10-CM | POA: Diagnosis not present

## 2017-10-23 DIAGNOSIS — E119 Type 2 diabetes mellitus without complications: Secondary | ICD-10-CM | POA: Diagnosis not present

## 2017-10-23 DIAGNOSIS — R600 Localized edema: Secondary | ICD-10-CM | POA: Diagnosis not present

## 2017-10-23 DIAGNOSIS — I5033 Acute on chronic diastolic (congestive) heart failure: Secondary | ICD-10-CM | POA: Diagnosis not present

## 2017-10-23 DIAGNOSIS — S81802D Unspecified open wound, left lower leg, subsequent encounter: Secondary | ICD-10-CM | POA: Diagnosis not present

## 2017-10-23 DIAGNOSIS — I5032 Chronic diastolic (congestive) heart failure: Secondary | ICD-10-CM | POA: Diagnosis not present

## 2017-10-23 DIAGNOSIS — N179 Acute kidney failure, unspecified: Secondary | ICD-10-CM | POA: Diagnosis not present

## 2017-10-23 DIAGNOSIS — I4891 Unspecified atrial fibrillation: Secondary | ICD-10-CM | POA: Diagnosis not present

## 2017-10-23 DIAGNOSIS — I5030 Unspecified diastolic (congestive) heart failure: Secondary | ICD-10-CM | POA: Diagnosis not present

## 2017-10-23 DIAGNOSIS — R296 Repeated falls: Secondary | ICD-10-CM | POA: Diagnosis not present

## 2017-10-23 DIAGNOSIS — E1129 Type 2 diabetes mellitus with other diabetic kidney complication: Secondary | ICD-10-CM | POA: Diagnosis not present

## 2017-10-23 DIAGNOSIS — S91109D Unspecified open wound of unspecified toe(s) without damage to nail, subsequent encounter: Secondary | ICD-10-CM | POA: Diagnosis not present

## 2017-10-23 DIAGNOSIS — I208 Other forms of angina pectoris: Secondary | ICD-10-CM | POA: Diagnosis not present

## 2017-10-23 DIAGNOSIS — Z7901 Long term (current) use of anticoagulants: Secondary | ICD-10-CM | POA: Diagnosis not present

## 2017-10-23 DIAGNOSIS — I639 Cerebral infarction, unspecified: Secondary | ICD-10-CM | POA: Diagnosis not present

## 2017-10-23 DIAGNOSIS — Z794 Long term (current) use of insulin: Secondary | ICD-10-CM | POA: Diagnosis not present

## 2017-10-23 DIAGNOSIS — R531 Weakness: Secondary | ICD-10-CM | POA: Diagnosis not present

## 2017-10-23 DIAGNOSIS — N189 Chronic kidney disease, unspecified: Secondary | ICD-10-CM | POA: Diagnosis not present

## 2017-10-23 DIAGNOSIS — Z743 Need for continuous supervision: Secondary | ICD-10-CM | POA: Diagnosis not present

## 2017-10-23 DIAGNOSIS — N183 Chronic kidney disease, stage 3 (moderate): Secondary | ICD-10-CM | POA: Diagnosis not present

## 2017-10-23 DIAGNOSIS — R41841 Cognitive communication deficit: Secondary | ICD-10-CM | POA: Diagnosis not present

## 2017-10-23 DIAGNOSIS — D638 Anemia in other chronic diseases classified elsewhere: Secondary | ICD-10-CM | POA: Diagnosis not present

## 2017-10-23 DIAGNOSIS — S81812D Laceration without foreign body, left lower leg, subsequent encounter: Secondary | ICD-10-CM | POA: Diagnosis not present

## 2017-10-23 DIAGNOSIS — R279 Unspecified lack of coordination: Secondary | ICD-10-CM | POA: Diagnosis not present

## 2017-10-23 DIAGNOSIS — R269 Unspecified abnormalities of gait and mobility: Secondary | ICD-10-CM | POA: Diagnosis not present

## 2017-10-23 DIAGNOSIS — S91301D Unspecified open wound, right foot, subsequent encounter: Secondary | ICD-10-CM | POA: Diagnosis not present

## 2017-10-23 DIAGNOSIS — I481 Persistent atrial fibrillation: Secondary | ICD-10-CM | POA: Diagnosis not present

## 2017-10-23 DIAGNOSIS — R2689 Other abnormalities of gait and mobility: Secondary | ICD-10-CM | POA: Diagnosis not present

## 2017-10-23 DIAGNOSIS — L89322 Pressure ulcer of left buttock, stage 2: Secondary | ICD-10-CM | POA: Diagnosis not present

## 2017-10-23 DIAGNOSIS — Z8673 Personal history of transient ischemic attack (TIA), and cerebral infarction without residual deficits: Secondary | ICD-10-CM | POA: Diagnosis not present

## 2017-10-23 DIAGNOSIS — S91109A Unspecified open wound of unspecified toe(s) without damage to nail, initial encounter: Secondary | ICD-10-CM | POA: Diagnosis not present

## 2017-10-23 DIAGNOSIS — M6281 Muscle weakness (generalized): Secondary | ICD-10-CM | POA: Diagnosis not present

## 2017-10-23 DIAGNOSIS — S81802A Unspecified open wound, left lower leg, initial encounter: Secondary | ICD-10-CM | POA: Diagnosis not present

## 2017-10-23 DIAGNOSIS — R0600 Dyspnea, unspecified: Secondary | ICD-10-CM | POA: Diagnosis not present

## 2017-10-23 LAB — GLUCOSE, CAPILLARY
Glucose-Capillary: 201 mg/dL — ABNORMAL HIGH (ref 70–99)
Glucose-Capillary: 263 mg/dL — ABNORMAL HIGH (ref 70–99)

## 2017-10-23 LAB — BASIC METABOLIC PANEL
Anion gap: 10 (ref 5–15)
BUN: 42 mg/dL — AB (ref 8–23)
CALCIUM: 8.5 mg/dL — AB (ref 8.9–10.3)
CHLORIDE: 101 mmol/L (ref 98–111)
CO2: 26 mmol/L (ref 22–32)
CREATININE: 1.27 mg/dL — AB (ref 0.61–1.24)
GFR calc Af Amer: 59 mL/min — ABNORMAL LOW (ref >60)
GFR calc non Af Amer: 50 mL/min — ABNORMAL LOW (ref >60)
GLUCOSE: 228 mg/dL — AB (ref 70–99)
Potassium: 3.7 mmol/L (ref 3.5–5.1)
Sodium: 137 mmol/L (ref 135–145)

## 2017-10-23 MED ORDER — INSULIN GLARGINE 100 UNIT/ML ~~LOC~~ SOLN: 35.0000 [IU] | Freq: Every day | SUBCUTANEOUS | Status: DC

## 2017-10-23 MED ORDER — LEVALBUTEROL HCL 0.63 MG/3ML IN NEBU
0.6300 mg | INHALATION_SOLUTION | Freq: Two times a day (BID) | RESPIRATORY_TRACT | Status: DC
  Administered 2017-10-23: 0.63 mg via RESPIRATORY_TRACT
  Filled 2017-10-23: qty 3

## 2017-10-23 MED ORDER — METOPROLOL TARTRATE 25 MG PO TABS: 12.5000 mg | ORAL_TABLET | Freq: Two times a day (BID) | ORAL | Status: DC

## 2017-10-23 MED ORDER — INSULIN LISPRO 100 UNIT/ML (KWIKPEN): 5.0000 [IU] | PEN_INJECTOR | Freq: Three times a day (TID) | SUBCUTANEOUS | Status: DC

## 2017-10-23 MED ORDER — IPRATROPIUM BROMIDE 0.02 % IN SOLN
0.5000 mg | Freq: Two times a day (BID) | RESPIRATORY_TRACT | Status: DC
  Administered 2017-10-23: 0.5 mg via RESPIRATORY_TRACT
  Filled 2017-10-23: qty 2.5

## 2017-10-23 MED ORDER — FUROSEMIDE 40 MG PO TABS: 40.0000 mg | ORAL_TABLET | Freq: Two times a day (BID) | ORAL | Status: DC

## 2017-10-23 MED ORDER — INSULIN GLARGINE 100 UNIT/ML ~~LOC~~ SOLN
10.0000 [IU] | Freq: Once | SUBCUTANEOUS | Status: AC
  Administered 2017-10-23: 10 [IU] via SUBCUTANEOUS
  Filled 2017-10-23: qty 0.1

## 2017-10-23 MED ORDER — INSULIN LISPRO 100 UNIT/ML ~~LOC~~ SOLN: 0.0000 [IU] | Freq: Three times a day (TID) | SUBCUTANEOUS | Status: DC

## 2017-10-23 MED ORDER — ACETAMINOPHEN 325 MG PO TABS: 650.0000 mg | ORAL_TABLET | Freq: Four times a day (QID) | ORAL | Status: DC | PRN

## 2017-10-23 NOTE — Progress Notes (Signed)
Pt IV discontinued, catheter intact and telemetry removed. PTAR at bedside

## 2017-10-23 NOTE — Progress Notes (Signed)
Pt discharging to SNF, [facility]. Report given to RN Estill Bamberg at facility at this time.  Copy of discharge instructions printed and sent with transporters for facility.  Pt d/c'd with belongings, transported by [facility].

## 2017-10-23 NOTE — Clinical Social Work Note (Signed)
CSW facilitated patient discharge including contacting patient family and facility to confirm patient discharge plans. Clinical information faxed to facility and family agreeable with plan. CSW arranged ambulance transport via [facility] to [facility]. RN to call report prior to discharge [phone] Room 209).  CSW will sign off for now as social work intervention is no longer needed. Please consult [place] again if new needs arise.  Dayton Scrape, [place]

## 2017-10-23 NOTE — Care Management Important Message (Signed)
Important Message  Patient Details  Name: Charles Friedman MRN: [id number] Date of Birth: [birth year]   Medicare Important Message Given:  Yes    Charles Friedman [place] 10/23/2017, 3:20 PM

## 2017-10-23 NOTE — Clinical Social Work Placement (Signed)
   CLINICAL SOCIAL WORK PLACEMENT  NOTE  Date:  10/23/2017  Patient Details  Name: Charles Friedman MRN: [id number] Date of Birth: 11/18/1932  Clinical Social Work is seeking post-discharge placement for this patient at the [place] level of care (*CSW will initial, date and re-position this form in  chart as items are completed):  Yes   Patient/family provided with [place] Work Department's list of facilities offering this level of care within the geographic area requested by the patient (or if unable, by the patient's family).  Yes   Patient/family informed of their freedom to choose among providers that offer the needed level of care, that participate in Medicare, Medicaid or managed care program needed by the patient, have an available bed and are willing to accept the patient.  Yes   Patient/family informed of [place]'s ownership interest in [facility] and [facility], as well as of the fact that they are under no obligation to receive care at these facilities.  PASRR submitted to EDS on 10/22/17     PASRR number received on 10/22/17     Existing PASRR number confirmed on       FL2 transmitted to all facilities in geographic area requested by pt/family on 10/22/17     FL2 transmitted to all facilities within larger geographic area on       Patient informed that his/her managed care company has contracts with or will negotiate with certain facilities, including the following:        Yes   Patient/family informed of bed offers received.  Patient chooses bed at [place], [place]     Physician recommends and patient chooses bed at      Patient to be transferred to [facility] on 10/23/17.  Patient to be transferred to facility by PTAR     Patient family notified on 10/23/17 of transfer.  Name of family member notified:  Danae Chen     PHYSICIAN Please prepare prescriptions     Additional Comment:     _______________________________________________ Candie Chroman, LCSW 10/23/2017, 1:56 PM

## 2017-10-23 NOTE — Progress Notes (Signed)
Inpatient Diabetes Program Recommendations  AACE/ADA: New Consensus Statement on Inpatient Glycemic Control (2015)  Target Ranges:  Prepandial:   less than 140 mg/dL      Peak postprandial:   less than 180 mg/dL (1-2 hours)      Critically ill patients:  140 - 180 mg/dL   Lab Results  Component Value Date   GLUCAP 201 (H) 10/23/2017   HGBA1C 8.8 (H) 11/26/2015    Review of Glycemic Control Results for REVEL, STELLMACH (MRN [id number]) as of 10/23/2017 09:19  Ref. Range 10/22/2017 16:53 10/22/2017 21:26 10/23/2017 08:24  Glucose-Capillary Latest Ref Range: 70 - 99 mg/dL 296 (H) 251 (H) 201 (H)   Diabetes history: Type 2 DM Outpatient Diabetes medications: Lantus 35 units QD, Humalog 5 units TID Current orders for Inpatient glycemic control: Lantus 20 units QHS, Novolog 0-9 units TID, Novolog 0-5 units QHS, Novolog 3 units TID  Inpatient Diabetes Program Recommendations:    Noted in MD note from 10/22/17, plan to increase Lantus to 22 units QD, however, order still reads Lantus 20 units QD. Agree with increase just needs adjustment in Epic.   Thanks, Bronson Curb, MSN, RNC-OB Diabetes Coordinator [phone] (8a-5p)

## 2017-10-23 NOTE — Discharge Summary (Signed)
Physician Discharge Summary  Charles Friedman:[id number] DOB: Oct 29, 1932  PCP: Haywood Pao, MD  Admit date: 10/19/2017 Discharge date: 10/23/2017  Recommendations for Outpatient Follow-up:  1. MD at SNF in 3 days with repeat labs (CBC, BMP & A1c). 2. Dr. Domenick Gong, PCP upon discharge from SNF. 3. Dr. Glenetta Hew, Cardiology in 2 weeks.  SNF to assist with follow-up.  Home Health: Patient being discharged to [facility] [place] [facility]. Equipment/Devices: None.  Discharge Condition: Improved and stable. CODE STATUS: Full. Diet recommendation: Healthy and diabetic diet.  Discharge Diagnoses:  Principal Problem:   Acute on chronic diastolic CHF (congestive heart failure) (HCC) Active Problems:   Diabetes mellitus without complication (HCC)   Persistent atrial fibrillation (HCC)   History of CVA (cerebrovascular accident)   Renal insufficiency   Generalized weakness   Recurrent falls   Brief Summary: 84 year old male, lives alone, ambulates with the help of a walker, has a caregiver to assist, with PMH of A. fib on Xarelto, DM 2/IDDM, HLD, chronic diastolic CHF, CVA, prostate cancer status post radiation seeds presented to ED with complaints of generalized weakness, recurrent falls, dyspnea and progressive lower extremity edema.  He had been evaluated in the ED a few days ago after a fall with skin tears and right ear injury/hematoma and had been seen by ENT.  Noncompliant with prescribed Lasix.  He was admitted for acute on chronic diastolic CHF, generalized weakness with frequent falls and acute on chronic kidney disease.     Assessment & Plan:  Acute on chronic diastolic CHF: Precipitated by noncompliance with prescribed Lasix 40 mg daily.  TTE 9/3: LVEF 50-55%.    Treated with IV Lasix 60 mg twice daily.  -3 L since admission.  Weight down by approximately 7 pounds.  Not convinced that his intake output and weights recorded here are fully accurate but do believe that  he has diuresed well.  Clinically improved.  Transitioned to oral Lasix 40 mg twice daily with close outpatient follow-up at SNF to determine regarding adjusting Lasix dose.  Also outpatient follow-up with cardiology.  Acute on suspected chronic kidney disease: No recent labs to know baseline.  Last creatinine was 1.08 in 2017.  Presented with creatinine of 1.64 on admission.  Creatinine continues to improve, down to 1.27.  Some of this may be due to poor perfusion from CHF.    Follow BMP closely at SNF.  Debility and frequent falls: As per report, has been generally weak lately and has fallen multiple times sustaining injury to right ear including hematoma that did not require suture or surgery and suffering minor skin tears.  CT head without acute findings.  PT eval and recommend SNF, patient agreeable.  Chronic A. Fib: CHADS-VASc 6 (age x2, CVA x2, DM, CHF).  Controlled ventricular rate.  Continue metoprolol 12.5 mg twice daily and Xarelto.  Type II DM with renal complications: S5K 8.8 approx 2 years ago.    Mildly uncontrolled as noted below but he was on lower than home doses of insulins.  Continue prior home doses of Lantus and mealtime Humalog.  Added Humalog SSI at discharge.  Monitor closely and adjust insulins as needed at SNF.  Check A1c at SNF.  History of CVA: CT head without acute findings.  No focal deficits.  Continue statins and Xarelto.  Hyperlipidemia: Statins.  Anemia: May be due to chronic kidney disease.  Stable.  Left shin skin tear: No acute findings.  Dry dressings as needed.   Consultants:  None  Procedures:  None   Discharge Instructions  Discharge Instructions    (HEART FAILURE PATIENTS) Call MD:  Anytime you have any of the following symptoms: 1) 3 pound weight gain in 24 hours or 5 pounds in 1 week 2) shortness of breath, with or without a dry hacking cough 3) swelling in the hands, feet or stomach 4) if you have to sleep on extra pillows at night  in order to breathe.   Complete by:  As directed    Call MD for:  difficulty breathing, headache or visual disturbances   Complete by:  As directed    Call MD for:  extreme fatigue   Complete by:  As directed    Call MD for:  persistant dizziness or light-headedness   Complete by:  As directed    Diet - low sodium heart healthy   Complete by:  As directed    Diet Carb Modified   Complete by:  As directed    Increase activity slowly   Complete by:  As directed        Medication List    TAKE these medications   acetaminophen 325 MG tablet Commonly known as:  TYLENOL Take 2 tablets (650 mg total) by mouth every 6 (six) hours as needed for mild pain or moderate pain. What changed:    medication strength  how much to take  reasons to take this   atorvastatin 40 MG tablet Commonly known as:  LIPITOR Take 1 tablet (40 mg total) by mouth daily at 6 PM.   escitalopram 5 MG tablet Commonly known as:  LEXAPRO Take 5 mg by mouth daily.   furosemide 40 MG tablet Commonly known as:  LASIX Take 1 tablet (40 mg total) by mouth 2 (two) times daily. What changed:  when to take this   insulin glargine 100 UNIT/ML injection Commonly known as:  LANTUS Inject 0.35 mLs (35 Units total) into the skin daily. Start taking on:  10/24/2017   insulin lispro 100 UNIT/ML injection Commonly known as:  HUMALOG Inject 0-0.09 mLs (0-9 Units total) into the skin 3 (three) times daily with meals. Correction coverage: Sensitive (thin, NPO, renal) CBG < 70: implement hypoglycemia protocol CBG 70 - 120: 0 units CBG 121 - 150: 1 unit CBG 151 - 200: 2 units CBG 201 - 250: 3 units CBG 251 - 300: 5 units CBG 301 - 350: 7 units CBG 351 - 400: 9 units CBG > 400: call MD. What changed:  You were already taking a medication with the same name, and this prescription was added. Make sure you understand how and when to take each.   insulin lispro 100 UNIT/ML KiwkPen Commonly known as:  HUMALOG Inject  0.05 mLs (5 Units total) into the skin 3 (three) times daily with meals. What changed:    medication strength  when to take this   LYRICA 75 MG capsule Generic drug:  pregabalin 75 mg 2 (two) times daily.   metoprolol tartrate 25 MG tablet Commonly known as:  LOPRESSOR Take 0.5 tablets (12.5 mg total) by mouth 2 (two) times daily. What changed:    how much to take  how to take this  when to take this  additional instructions   nitroGLYCERIN 0.4 MG SL tablet Commonly known as:  NITROSTAT Place 1 tablet (0.4 mg total) under the tongue every 5 (five) minutes as needed for chest pain.   rivaroxaban 20 MG Tabs tablet Commonly known as:  XARELTO Take  1 tablet (20 mg total) by mouth daily with supper.   tamsulosin 0.4 MG Caps capsule Commonly known as:  FLOMAX Take 0.4 mg by mouth daily.       Contact information for follow-up providers    Tisovec, Fransico Him, MD. Schedule an appointment as soon as possible for a visit.   Specialty:  Internal Medicine Why:  Upon discharge from SNF. Contact information: [place] [address] [phone]        MD at SNF. Schedule an appointment as soon as possible for a visit in 3 day(s).   Why:  To be seen with repeat labs (CBC & BMP).       Leonie Man, MD. Schedule an appointment as soon as possible for a visit in 2 week(s).   Specialty:  Cardiology Contact information: [address] [place] [address] [phone]            Contact information for after-discharge care    Destination    [facility] .   Service:  Skilled Nursing Contact information: [place] [phone]                 Allergies  Allergen Reactions  . Aureomycin [Chlortetracycline] Itching and Rash    Reaction approximately 1956      Procedures/Studies: Dg Chest 2 View  Result Date: 10/21/2017 CLINICAL DATA:   Cough and shortness of breath for 4 days. EXAM: CHEST - 2 VIEW COMPARISON:  10/20/2017. FINDINGS: Trachea is midline. Heart is enlarged. Mild interstitial prominence and indistinctness are seen diffusely. Mild bibasilar subsegmental atelectasis. No pleural fluid. Degenerative changes in the spine. IMPRESSION: 1. Pulmonary edema. 2. Bibasilar atelectasis. Electronically Signed   By: Lorin Picket M.D.   On: 10/21/2017 08:10   Ct Head Wo Contrast  Result Date: 10/20/2017 CLINICAL DATA:  [place] twice today. Generalized weakness. History of diabetes, prostate cancer, hyperlipidemia, stroke. EXAM: CT HEAD WITHOUT CONTRAST TECHNIQUE: Contiguous axial images were obtained from the base of the skull through the vertex without intravenous contrast. COMPARISON:  CT HEAD October 16, 2017 FINDINGS: BRAIN: No intraparenchymal hemorrhage, mass effect nor midline shift. The ventricles and sulci are normal for age. Patchy supratentorial white matter hypodensities within normal range for patient's age, though non-specific are most compatible with chronic small vessel ischemic disease. Old small bifrontal lobe infarcts. No acute large vascular territory infarcts. No abnormal extra-axial fluid collections. Basal cisterns are patent. VASCULAR: Mild calcific atherosclerosis of the carotid siphons. SKULL: No skull fracture. No significant scalp soft tissue swelling. RIGHT ear thickening at site of prior hemorrhage/laceration. SINUSES/ORBITS: Mild paranasal sinus mucosal thickening. Mastoid air cells are well aerated.The included ocular globes and orbital contents are non-suspicious. Status post bilateral ocular lens implants. OTHER: Multiple dental caries. IMPRESSION: 1. No acute intracranial process. 2. Stable examination including old small bifrontal lobe infarcts and moderate chronic small vessel ischemic changes. Electronically Signed   By: Elon Alas M.D.   On: 10/20/2017 01:59   Ct Head Wo Contrast  Result Date:  10/16/2017 CLINICAL DATA:  Fall from bed at 6 a.m. with head trauma. EXAM: CT HEAD WITHOUT CONTRAST TECHNIQUE: Contiguous axial images were obtained from the base of the skull through the vertex without intravenous contrast. COMPARISON:  11/25/2015 FINDINGS: Brain: No evidence of acute infarction, hemorrhage, hydrocephalus, extra-axial collection or mass lesion/mass effect. Generalized atrophy, moderate. Mild or moderate chronic small vessel ischemia in the cerebral white matter. Small  remote left frontal cortex infarct seen laterally. Vascular: Atherosclerotic calcification.  No hyperdense vessel Skull: There is an ear laceration with bleeding and gas on the right. Negative for fracture. Sinuses/Orbits: No evidence of injury. Bilateral cataract resection. IMPRESSION: 1. Right ear laceration and bleeding. 2. No evidence of intracranial injury. Electronically Signed   By: Monte Fantasia M.D.   On: 10/16/2017 10:03   Dg Chest Portable 1 View  Result Date: 10/20/2017 CLINICAL DATA:  Shortness of breath EXAM: PORTABLE CHEST 1 VIEW COMPARISON:  November 25, 2015 FINDINGS: Cardiomegaly and pulmonary venous congestion. No pneumothorax. No other acute abnormalities. IMPRESSION: Cardiomegaly and pulmonary venous congestion. Electronically Signed   By: Dorise Bullion III M.D   On: 10/20/2017 00:27      Subjective: Patient denies complaints.  Dyspnea resolved and breathing back to baseline.  Leg swelling significantly improved.  No pain reported.  Denies dizziness, lightheadedness or palpitations.  As per RN, no acute issues noted.  Discharge Exam:  Vitals:   10/23/17 0403 10/23/17 0432 10/23/17 0827 10/23/17 1206  BP: 113/62   108/67  Pulse: 76   78  Resp: 18   18  Temp: 98.4 F (36.9 C)   98.5 F (36.9 C)  TempSrc: Oral   Oral  SpO2: 94%  96% 95%  Weight:  110.7 kg    Height:        General exam: Pleasant elderly male, moderately built and obese, sitting up comfortably in chair this  morning. Respiratory system: Occasional basal crackles, much improved compared to admission.  Rest of lung fields clear to auscultation.  No increased work of breathing. Cardiovascular system: S1 & S2 heard, irreg irregular.  No JVD or murmurs.  Trace bilateral leg edema, significantly improved compared to admission.  Telemetry personally reviewed: A. fib with controlled ventricular rate. Gastrointestinal system: Abdomen is nondistended, soft and nontender. No organomegaly or masses felt. Normal bowel sounds heard.   Central nervous system: Alert and oriented. No focal neurological deficits. Extremities: Symmetric 5 x 5 power. Skin: Couple of superficial skin tears over left shin without acute findings. Psychiatry: Judgement and insight appear impaired. Mood & affect appropriate.  ENT: Right external ear/pinna hematoma without active bleeding.    The results of significant diagnostics from this hospitalization (including imaging, microbiology, ancillary and laboratory) are listed below for reference.     Labs: CBC: Recent Labs  Lab 10/20/17 0033 10/21/17 0409 10/22/17 0345  WBC 11.8* 8.7 8.5  NEUTROABS  --  5.2  --   HGB 11.1* 9.7* 10.2*  HCT 34.6* 30.3* 31.0*  MCV 94.3 94.1 92.5  PLT 227 209 017   Basic Metabolic Panel: Recent Labs  Lab 10/20/17 0033 10/21/17 0409 10/22/17 0345 10/23/17 0551  NA 137 138 136 137  K 4.1 4.2 3.9 3.7  CL 105 105 104 101  CO2 22 24 23 26   GLUCOSE 136* 168* 181* 228*  BUN 38* 46* 46* 42*  CREATININE 1.64* 1.50* 1.31* 1.27*  CALCIUM 8.6* 8.1* 8.3* 8.5*  MG  --  2.1  --   --   PHOS  --  3.6  --   --    Liver Function Tests: Recent Labs  Lab 10/21/17 0409  AST 18  ALT 13  ALKPHOS 107  BILITOT 1.2  PROT 6.2*  ALBUMIN 2.8*   BNP (last 3 results) Recent Labs    10/20/17 0037  BNP 268.2*   CBG: Recent Labs  Lab 10/22/17 1220 10/22/17 1653 10/22/17 2126 10/23/17 0824 10/23/17  1113  GLUCAP 253* 296* 251* 201* 263*    Urinalysis    Component Value Date/Time   COLORURINE YELLOW 10/20/2017 1026   APPEARANCEUR CLEAR 10/20/2017 1026   LABSPEC 1.013 10/20/2017 1026   PHURINE 5.0 10/20/2017 1026   GLUCOSEU NEGATIVE 10/20/2017 1026   HGBUR NEGATIVE 10/20/2017 1026   BILIRUBINUR NEGATIVE 10/20/2017 1026   KETONESUR NEGATIVE 10/20/2017 1026   PROTEINUR NEGATIVE 10/20/2017 1026   UROBILINOGEN 0.2 07/15/2014 2038   NITRITE NEGATIVE 10/20/2017 1026   LEUKOCYTESUR NEGATIVE 10/20/2017 1026      Time coordinating discharge: 40 minutes  SIGNED:  Vernell Leep, MD, FACP, [facility]. [facility] Pager [phone]  If 7PM-7AM, please contact night-coverage [email or web address] Password [email or web address] 10/23/2017, 1:19 PM

## 2017-10-24 DIAGNOSIS — I5032 Chronic diastolic (congestive) heart failure: Secondary | ICD-10-CM | POA: Diagnosis not present

## 2017-10-24 DIAGNOSIS — E119 Type 2 diabetes mellitus without complications: Secondary | ICD-10-CM | POA: Diagnosis not present

## 2017-10-24 DIAGNOSIS — R0600 Dyspnea, unspecified: Secondary | ICD-10-CM | POA: Diagnosis not present

## 2017-10-24 DIAGNOSIS — I639 Cerebral infarction, unspecified: Secondary | ICD-10-CM | POA: Diagnosis not present

## 2017-10-27 DIAGNOSIS — L89322 Pressure ulcer of left buttock, stage 2: Secondary | ICD-10-CM | POA: Diagnosis not present

## 2017-10-27 DIAGNOSIS — S81802D Unspecified open wound, left lower leg, subsequent encounter: Secondary | ICD-10-CM | POA: Diagnosis not present

## 2017-10-27 DIAGNOSIS — S81802A Unspecified open wound, left lower leg, initial encounter: Secondary | ICD-10-CM | POA: Diagnosis not present

## 2017-10-29 ENCOUNTER — Ambulatory Visit (INDEPENDENT_AMBULATORY_CARE_PROVIDER_SITE_OTHER): Payer: Medicare Other | Admitting: Cardiology

## 2017-10-29 ENCOUNTER — Encounter: Payer: Self-pay | Admitting: Cardiology

## 2017-10-29 VITALS — BP 142/68 | HR 95 | Ht 73.0 in | Wt 249.8 lb

## 2017-10-29 DIAGNOSIS — I208 Other forms of angina pectoris: Secondary | ICD-10-CM | POA: Diagnosis not present

## 2017-10-29 DIAGNOSIS — R269 Unspecified abnormalities of gait and mobility: Secondary | ICD-10-CM | POA: Diagnosis not present

## 2017-10-29 DIAGNOSIS — R600 Localized edema: Secondary | ICD-10-CM | POA: Diagnosis not present

## 2017-10-29 DIAGNOSIS — I4819 Other persistent atrial fibrillation: Secondary | ICD-10-CM

## 2017-10-29 DIAGNOSIS — I481 Persistent atrial fibrillation: Secondary | ICD-10-CM | POA: Diagnosis not present

## 2017-10-29 DIAGNOSIS — I5033 Acute on chronic diastolic (congestive) heart failure: Secondary | ICD-10-CM

## 2017-10-29 MED ORDER — FUROSEMIDE 40 MG PO TABS: 40.0000 mg | ORAL_TABLET | Freq: Two times a day (BID) | ORAL | 3 refills | Status: DC

## 2017-10-29 MED ORDER — METOPROLOL TARTRATE 25 MG PO TABS: 25.0000 mg | ORAL_TABLET | Freq: Two times a day (BID) | ORAL | 3 refills | Status: DC

## 2017-10-29 NOTE — Progress Notes (Signed)
PCP: Haywood Pao, MD  Clinic Note: Chief Complaint  Patient presents with  . Hospitalization Follow-up    Mild CHF exacerbation.  . Atrial Fibrillation    HPI: Charles Friedman is a 84 y.o. male with a PMH notable for atrial fibrillation with chronic diastolic heart failure, history of diabetes and stroke who presents today for what amounts to be somewhat delayed 59-month follow-up because of recent hospitalization making this a hospital follow-up as well for recent heart failure exacerbation.Charles Friedman was last seen by me on April 10, 2017 -he was doing quite well at that time.  He was occasionally able to feel his heartbeat in but only when it went fast.  Noted some exertional dyspnea but nothing new for him.  Was limited to walking with a walker.  Swelling was pretty well controlled.  Weight was stable.  Maintain current medications.  Recent Hospitalizations:   ER visit 10/16/2017: Slipped and fell getting out of bed in the morning.  He hit his right ear on the corner of his bed frame and had pretty significant bleeding.  No loss of consciousness, just a significant amount of bruising and contusion to the ear.  He was relatively stable, despite the fact that the caregiver felt that he was profusely dyspneic.  He was discharged home with dressing, but progressively got worse over the weekend.  9/2-6/19 - (CHF, not taking lasix. - Rx IV Lasix -dropped 7 pounds- changed to PO) --presented with generalized weakness recurrent falls and dyspnea with significant bilateral edema.  (Caregiver indicates to me that she actually witnesses him taking his medication, but EDP note indicates that he does not routinely take his Lasix because he does not want to continue urinating) --> somewhat of a disconnect.  BNP was 268.  Was treated with IV Lasix in the ER and then for short stay.  He was then discharged to [facility] for rehab  Studies Personally Reviewed  - (if available, images/films reviewed: From Epic Chart or Care Everywhere)  TTE 10/20/17: Low normal LV function.  EF 50 to 55%.  No R WMA.  Moderate LA dilation.  Mild to moderate RV dilation with mildly reduced function -however no significant tricuspid regurgitation noted.  Mild R RA dilation.    Interval History: Charles Friedman presents today for hospital follow-up.  Symptomatically he seems to be doing better, but still has notable edema.  He really does not describe having orthopnea, or PND.  He does have exertional dyspnea but he really does not do much walking, having to be done with a walker.  With his rehab is not noticing any symptoms of exertional dyspnea or chest discomfort. He has no sensation whatsoever being in A. fib unless he is doing activities or gets anxious and his heart rate goes up.  He is currently at [facility] for rehab, but will be discharged soon to go back home.  He is accompanied by his caregiver who I actually thought was his daughter.  She seems to know more about him than anywhere else does.  While he has an unsteady gait and occasional falls, he has not had syncope, near syncope or TIA/amaurosis fugax.  No claudication.  ROS: A comprehensive was performed. Review of Systems  Constitutional: Positive for malaise/fatigue (Lack of get up and go.  Partially due to lack of desire).  HENT: Negative for nosebleeds.   Cardiovascular: Positive for leg swelling.  Gastrointestinal: Negative for abdominal pain, blood  in stool and melena.  Genitourinary: Negative for dysuria and hematuria.  Musculoskeletal: Positive for joint pain. Negative for falls (Not since he fell out of bed.).  Neurological: Positive for dizziness.       Poor balance, unsteady gait.  Endo/Heme/Allergies: Bruises/bleeds easily.  Psychiatric/Behavioral: Positive for depression and memory loss.  All other systems reviewed and are negative.  I have reviewed and (if needed) personally updated the patient's  problem list, medications, allergies, past medical and surgical history, social and family history.   Past Medical History:  Diagnosis Date  . Atrial fibrillation ([place]) 2003   Initially on Coumadin, now on Xarelto  . Cataract    Bilateral cataracts.  . Diabetes mellitus without complication ([place])    type II  . Diabetic peripheral neuropathy associated with type 2 diabetes mellitus ([place])   . Glaucoma    left eye  . Hx of radiation therapy 08/15/13- 10/06/13   prostate 7600 cGy 38 sessions  . Hyperlipidemia with target LDL less than 70   . Neuropathy    diabetic, in feet  . Personal history of fall 09/26/13   front entrance of [place]  . Prostate cancer [facility])    History of radiation therapy    Past Surgical History:  Procedure Laterality Date  . CAROTID DOPPLERS  11/2015   Mild plaque bilaterally.  Less than 40%.  Antegrade vertebral flow.  Marland Kitchen PROSTATE BIOPSY  12/25/11   Adenocarcinoma, gleason 6  . TRANSTHORACIC ECHOCARDIOGRAM  11/2015   In setting of CVA: Technically difficult.  Normal LV size and function.  EF 55-60%.  No regional wall motion normality.  Moderate LA dilation, mild RA dilation.  Mildly increased PA pressures.    Current Meds  Medication Sig  . acetaminophen (TYLENOL) 325 MG tablet Take 2 tablets (650 mg total) by mouth every 6 (six) hours as needed for mild pain or moderate pain.  Marland Kitchen atorvastatin (LIPITOR) 40 MG tablet Take 1 tablet (40 mg total) by mouth daily at 6 PM.  . escitalopram (LEXAPRO) 5 MG tablet Take 5 mg by mouth daily.  . furosemide (LASIX) 40 MG tablet Take 1 tablet (40 mg total) by mouth 2 (two) times daily. MAY TAKE AN EXTRA 40 MG IN THE MORNING IF WEIGHT IS ABOVE 3 LBS.  Marland Kitchen insulin glargine (LANTUS) 100 UNIT/ML injection Inject 0.35 mLs (35 Units total) into the skin daily.  . insulin lispro (HUMALOG KWIKPEN) 100 UNIT/ML KiwkPen Inject 0.05 mLs (5 Units total) into the skin 3 (three) times daily with meals.  . insulin lispro (HUMALOG) 100 UNIT/ML  injection Inject 0-0.09 mLs (0-9 Units total) into the skin 3 (three) times daily with meals. Correction coverage: Sensitive (thin, NPO, renal) CBG < 70: implement hypoglycemia protocol CBG 70 - 120: 0 units CBG 121 - 150: 1 unit CBG 151 - 200: 2 units CBG 201 - 250: 3 units CBG 251 - 300: 5 units CBG 301 - 350: 7 units CBG 351 - 400: 9 units CBG > 400: call MD.  . LYRICA 75 MG capsule 75 mg 2 (two) times daily.   . rivaroxaban (XARELTO) 20 MG TABS tablet Take 1 tablet (20 mg total) by mouth daily with supper.  . tamsulosin (FLOMAX) 0.4 MG CAPS capsule Take 0.4 mg by mouth daily.  . [DISCONTINUED] furosemide (LASIX) 40 MG tablet Take 1 tablet (40 mg total) by mouth 2 (two) times daily.  . [DISCONTINUED] metoprolol tartrate (LOPRESSOR) 25 MG tablet Take 0.5 tablets (12.5 mg total) by mouth 2 (  two) times daily.    Allergies  Allergen Reactions  . Aureomycin [Chlortetracycline] Itching and Rash    Reaction approximately 1956    Social History   Tobacco Use  . Smoking status: Never Smoker  . Smokeless tobacco: Never Used  Substance Use Topics  . Alcohol use: No  . Drug use: No   Social History   Social History Narrative   Pt lives alone -widowed with long-term companion / caregiver.   Currently lives in assisted living facility.   Has 2 sons, one in [place], [place] and the other in [place].     His son Spence Soberano. lives here locally.  Phone [phone].   He is retired [employer] worker.  Retired in 1993 after 86 years of work.    family history includes Cancer (age of onset: 32) in his sister; Heart attack (age of onset: 35) in his father; Heart disease in his mother.  Wt Readings from Last 3 Encounters:  10/29/17 249 lb 12.8 oz (113.3 kg)  10/23/17 244 lb (110.7 kg)  10/16/17 240 lb (108.9 kg)    PHYSICAL EXAM BP (!) 142/68   Pulse 95   Ht 6\' 1"  (1.854 m)   Wt 249 lb 12.8 oz (113.3 kg)   SpO2 92%   BMI 32.96 kg/m  Physical Exam  Constitutional: He is  oriented to person, place, and time. He appears well-developed and well-nourished. No distress.  Flushed appearing.  Somewhat confused  HENT:  Head: Normocephalic and atraumatic.  Ruddy complexion  Neck: Hepatojugular reflux (with cannon A waves from A fib) present. No JVD present. Carotid bruit is not present.  Cardiovascular: Normal rate, S1 normal and S2 normal. An irregularly irregular rhythm present. PMI is not displaced. Exam reveals distant heart sounds and decreased pulses (Decreased pedal pulses due to edema.). Exam reveals no gallop and no friction rub.  No murmur heard. Pulmonary/Chest: Effort normal and breath sounds normal. No respiratory distress. He has no wheezes. He has no rales.  Decreased breath sounds in bases -clears up with cough  Abdominal: Soft. Bowel sounds are normal. He exhibits no distension. There is no tenderness. There is no rebound.  No HSM  Musculoskeletal: Normal range of motion. He exhibits edema (2+ BLE with Venous stasis changes / dermatitis).  Neurological: He is alert and oriented to person, place, and time.  Skin:  stasis dermatitis  Psychiatric: He has a normal mood and affect. Thought content normal.  Defers to caregiver for most information.  Vitals reviewed.   Adult ECG Report n/a  Other studies Reviewed: Additional studies/ records that were reviewed today include:  Recent Labs:   Lab Results  Component Value Date   CREATININE 1.27 (H) 10/23/2017   BUN 42 (H) 10/23/2017   NA 137 10/23/2017   K 3.7 10/23/2017   CL 101 10/23/2017   CO2 26 10/23/2017     ASSESSMENT / PLAN: Problem List Items Addressed This Visit    Abnormality of gait    Monitor closely for recurrent falls.  He is currently doing rehab after his most recent episode. If he does have more frequent falls, may need to consider converting back to aspirin as opposed to [place] for A. fib.      Acute on chronic diastolic CHF (congestive heart failure), NYHA class 2 (HCC) -  Primary (Chronic)    Recent acute exacerbation as best I can tell related to him holding his diuretic dose.  He seems to be about 7 or 8  pounds up from when he was during his last visit with me when he seemed to be relatively euvolemic.  He does have about 2+ edema bilaterally with venous stasis changes.  Plan: Recommend continued use of support stockings.   Weight daily.  Current weight is 8 pounds above target weight. We will increase Lasix to 80 mg twice daily for 1 day then 80 mg in the morning 40 mg in the evening for 2 days.  If weight is back to baseline at that time would reduce to 40 mg twice daily.  If not weight, would continue the 80-40 mg dosing until at target weight.  He will need chemistry panel checked soon, and will follow-up shortly with Almyra Deforest, PA.  If blood pressure increases, we may build to add ARB, but for now with his unsteady gait.  I would prefer to allow for some mild permissive hypertension.      Relevant Medications   metoprolol tartrate (LOPRESSOR) 25 MG tablet   furosemide (LASIX) 40 MG tablet   Bilateral edema of lower extremity (Chronic)    Clear evidence of venous stasis.  Probably more so venous stasis than CHF related. Usually well controlled on stable dose of Lasix, but for now seems to be about 8 pounds volume up.  Increased Lasix dosing, foot elevation and support stockings as noted in CHF section      Persistent atrial fibrillation (HCC) (Chronic)    For the most part pretty much asymptomatic.  Rate is borderline control, but with his frequent falls, I am concerned for possible orthostasis. I do think I want to have more rate control, will increase metoprolol to 25 mg twice daily.  Remains on Xarelto for anti-coagulation.  I did mention that if he were to have a fall and bleed like the last episode, he should just simply stop it for 2 days, then bleeding will stop. (They basically did this despite different advice from EDP)  For now we will  simply continue rate control and not look for rhythm control because he is relatively asymptomatic.      Relevant Medications   metoprolol tartrate (LOPRESSOR) 25 MG tablet   furosemide (LASIX) 40 MG tablet      I spent a total of 45-50 minutes with the patient and chart review. >  50% of the time was spent in direct patient consultation.  Extensive chart review - ER x 2 with hospitalization. Echo report. Discussed reasons for admission & treatment plan going forward. Also filling out paperwork for SNF.  Current medicines are reviewed at length with the patient today.  (+/- concerns) not sure how to take Lasix  The following changes have been made:  See below  Patient Instructions  1)WEIGHT [place]. PATIENT TODAY IS 8 LBS  ABOVE  GOAL. ( 249 LBS)  DRY WEIGHT IS 241 LBS. FOLLOW BELOW INSTRUCTIONS  2) MEDICATION INSTRUCTIONS   INCREASE LASIX TO 80 MG TWICE A DAY FOR 1 DAY ( Friday  10/30/17)  THEN 80 MG IN THE MORNING  AND 40 MG IN THE EVENING FOR 2 DAYS. THEN  40 MG  TWICE A DAY . ( IF WEIGHT IS @ GOAL ABOVE 8 LBS   THEN 80 MG IN MORNING  40 MG IN THE EVENING  UNTIL WEIGHT IS 241 LBS)   3) LABS  BMP ON 11/01/17  4) GOING FORWARD - ONCE AT WEIGHT GOAL   LASIX 40 MG  TWICE A DAY WITH SLIDING  SCALE  --IF WEIGHT IS GREATER 3 LBS  FROM GOAL  INCREASE TO 80 MG IN MORNING  AND 40 MG IN THE EVENING UNTIL BACK TO GAOL    Your physician recommends that you schedule a follow-up appointment in 3 MONTH WITH HAO PA.  Your physician wants you to follow-up in [place]. You will receive a reminder letter in the mail two months in advance. If you don't receive a letter, please call our office to schedule the follow-up appointment.   If you need a refill on your cardiac medications before your next appointment, please call your pharmacy.   Studies Ordered:   No orders of the defined types were placed in this encounter.     Glenetta Hew, M.D.,  M.S. Interventional Cardiologist   Pager # [phone] Phone # [phone] [address] [place], [place] [address]   Thank you for choosing Heart[facility] at [facility]!!

## 2017-10-29 NOTE — Patient Instructions (Addendum)
1)WEIGHT DAILY  EVERY MORNING  AND RECORD. PATIENT TODAY IS 8 LBS  ABOVE  GOAL. ( 249 LBS)  DRY WEIGHT IS 241 LBS. FOLLOW BELOW INSTRUCTIONS  2) MEDICATION INSTRUCTIONS   INCREASE LASIX TO 80 MG TWICE A DAY FOR 1 DAY ( Friday  10/30/17)  THEN 80 MG IN THE MORNING  AND 40 MG IN THE EVENING FOR 2 DAYS. THEN  40 MG  TWICE A DAY . ( IF WEIGHT IS @ GOAL ABOVE 8 LBS   THEN 80 MG IN MORNING  40 MG IN THE EVENING  UNTIL WEIGHT IS 241 LBS)   3) LABS  BMP ON 11/01/17  4) GOING FORWARD - ONCE AT WEIGHT GOAL   LASIX 40 MG  TWICE A DAY WITH SLIDING SCALE  --IF WEIGHT IS GREATER 3 LBS  FROM GOAL  INCREASE TO 80 MG IN MORNING  AND 40 MG IN THE EVENING UNTIL BACK TO GAOL    Your physician recommends that you schedule a follow-up appointment in 3 MONTH WITH HAO PA.  Your physician wants you to follow-up in [place]. You will receive a reminder letter in the mail two months in advance. If you don't receive a letter, please call our office to schedule the follow-up appointment.   If you need a refill on your cardiac medications before your next appointment, please call your pharmacy.

## 2017-10-31 ENCOUNTER — Encounter: Payer: Self-pay | Admitting: Cardiology

## 2017-10-31 NOTE — Assessment & Plan Note (Signed)
Clear evidence of venous stasis.  Probably more so venous stasis than CHF related. Usually well controlled on stable dose of Lasix, but for now seems to be about 8 pounds volume up.  Increased Lasix dosing, foot elevation and support stockings as noted in CHF section

## 2017-10-31 NOTE — Assessment & Plan Note (Addendum)
Recent acute exacerbation as best I can tell related to him holding his diuretic dose.  He seems to be about 7 or 8 pounds up from when he was during his last visit with me when he seemed to be relatively euvolemic.  He does have about 2+ edema bilaterally with venous stasis changes.  Plan: Recommend continued use of support stockings.   Weight daily.  Current weight is 8 pounds above target weight. We will increase Lasix to 80 mg twice daily for 1 day then 80 mg in the morning 40 mg in the evening for 2 days.  If weight is back to baseline at that time would reduce to 40 mg twice daily.  If not weight, would continue the 80-40 mg dosing until at target weight.  He will need chemistry panel checked soon, and will follow-up shortly with Almyra Deforest, PA.  If blood pressure increases, we may build to add ARB, but for now with his unsteady gait.  I would prefer to allow for some mild permissive hypertension.

## 2017-10-31 NOTE — Assessment & Plan Note (Signed)
Monitor closely for recurrent falls.  He is currently doing rehab after his most recent episode. If he does have more frequent falls, may need to consider converting back to aspirin as opposed to [place] for A. fib.

## 2017-10-31 NOTE — Assessment & Plan Note (Signed)
For the most part pretty much asymptomatic.  Rate is borderline control, but with his frequent falls, I am concerned for possible orthostasis. I do think I want to have more rate control, will increase metoprolol to 25 mg twice daily.  Remains on Xarelto for anti-coagulation.  I did mention that if he were to have a fall and bleed like the last episode, he should just simply stop it for 2 days, then bleeding will stop. (They basically did this despite different advice from EDP)  For now we will simply continue rate control and not look for rhythm control because he is relatively asymptomatic.

## 2017-11-03 DIAGNOSIS — L89322 Pressure ulcer of left buttock, stage 2: Secondary | ICD-10-CM | POA: Diagnosis not present

## 2017-11-03 DIAGNOSIS — S91109A Unspecified open wound of unspecified toe(s) without damage to nail, initial encounter: Secondary | ICD-10-CM | POA: Diagnosis not present

## 2017-11-03 DIAGNOSIS — S91109D Unspecified open wound of unspecified toe(s) without damage to nail, subsequent encounter: Secondary | ICD-10-CM | POA: Diagnosis not present

## 2017-11-03 DIAGNOSIS — S81802D Unspecified open wound, left lower leg, subsequent encounter: Secondary | ICD-10-CM | POA: Diagnosis not present

## 2017-11-05 ENCOUNTER — Other Ambulatory Visit: Payer: Self-pay | Admitting: *Deleted

## 2017-11-05 NOTE — Patient Outreach (Signed)
[facility]) Care Management  11/05/2017  Charles Friedman September 14, 1932 [id number]    Attended discharge planning meeting at [facility]. [facility] UM Members Manuela Schwartz and Jari Pigg present. Patient discussed and discharge plan noted as patient plans to go home with a care giver. Plans to discharge to home on Friday 11/06/17.  Met with patient at the bedside to explain [place] Management services.  Patient stated he has a care giver who provides transportation, and any other assistance he needs including meals.  Patient denies having any issues with medication affordability.  Patient stated he did not feel he was in need of care management services at this time.  [facility] pamphlet given to patient with [facility] CM contact number.  Updated [facility] UM Team Member patient declined services.  Rutherford Limerick RN, BSN [place] Acute Care Coordinator [phone]) Business Mobile [phone]) Toll free office

## 2017-11-06 DIAGNOSIS — I4891 Unspecified atrial fibrillation: Secondary | ICD-10-CM | POA: Diagnosis not present

## 2017-11-06 DIAGNOSIS — E119 Type 2 diabetes mellitus without complications: Secondary | ICD-10-CM | POA: Diagnosis not present

## 2017-11-06 DIAGNOSIS — I5030 Unspecified diastolic (congestive) heart failure: Secondary | ICD-10-CM | POA: Diagnosis not present

## 2017-11-06 DIAGNOSIS — R0600 Dyspnea, unspecified: Secondary | ICD-10-CM | POA: Diagnosis not present

## 2017-11-08 DIAGNOSIS — S81812D Laceration without foreign body, left lower leg, subsequent encounter: Secondary | ICD-10-CM | POA: Diagnosis not present

## 2017-11-08 DIAGNOSIS — L97529 Non-pressure chronic ulcer of other part of left foot with unspecified severity: Secondary | ICD-10-CM | POA: Diagnosis not present

## 2017-11-08 DIAGNOSIS — L97519 Non-pressure chronic ulcer of other part of right foot with unspecified severity: Secondary | ICD-10-CM | POA: Diagnosis not present

## 2017-11-08 DIAGNOSIS — L97419 Non-pressure chronic ulcer of right heel and midfoot with unspecified severity: Secondary | ICD-10-CM | POA: Diagnosis not present

## 2017-11-08 DIAGNOSIS — S91301D Unspecified open wound, right foot, subsequent encounter: Secondary | ICD-10-CM | POA: Diagnosis not present

## 2017-11-08 DIAGNOSIS — Z7901 Long term (current) use of anticoagulants: Secondary | ICD-10-CM | POA: Diagnosis not present

## 2017-11-08 DIAGNOSIS — N183 Chronic kidney disease, stage 3 (moderate): Secondary | ICD-10-CM | POA: Diagnosis not present

## 2017-11-08 DIAGNOSIS — E11621 Type 2 diabetes mellitus with foot ulcer: Secondary | ICD-10-CM | POA: Diagnosis not present

## 2017-11-08 DIAGNOSIS — Z794 Long term (current) use of insulin: Secondary | ICD-10-CM | POA: Diagnosis not present

## 2017-11-08 DIAGNOSIS — R296 Repeated falls: Secondary | ICD-10-CM | POA: Diagnosis not present

## 2017-11-08 DIAGNOSIS — I481 Persistent atrial fibrillation: Secondary | ICD-10-CM | POA: Diagnosis not present

## 2017-11-08 DIAGNOSIS — I5033 Acute on chronic diastolic (congestive) heart failure: Secondary | ICD-10-CM | POA: Diagnosis not present

## 2017-11-08 DIAGNOSIS — D631 Anemia in chronic kidney disease: Secondary | ICD-10-CM | POA: Diagnosis not present

## 2017-11-08 DIAGNOSIS — I130 Hypertensive heart and chronic kidney disease with heart failure and stage 1 through stage 4 chronic kidney disease, or unspecified chronic kidney disease: Secondary | ICD-10-CM | POA: Diagnosis not present

## 2017-11-08 DIAGNOSIS — Z9181 History of falling: Secondary | ICD-10-CM | POA: Diagnosis not present

## 2017-11-08 DIAGNOSIS — I482 Chronic atrial fibrillation: Secondary | ICD-10-CM | POA: Diagnosis not present

## 2017-11-08 DIAGNOSIS — R41841 Cognitive communication deficit: Secondary | ICD-10-CM | POA: Diagnosis not present

## 2017-11-08 DIAGNOSIS — Z8673 Personal history of transient ischemic attack (TIA), and cerebral infarction without residual deficits: Secondary | ICD-10-CM | POA: Diagnosis not present

## 2017-11-08 DIAGNOSIS — E1122 Type 2 diabetes mellitus with diabetic chronic kidney disease: Secondary | ICD-10-CM | POA: Diagnosis not present

## 2017-11-09 DIAGNOSIS — L97511 Non-pressure chronic ulcer of other part of right foot limited to breakdown of skin: Secondary | ICD-10-CM | POA: Diagnosis not present

## 2017-11-09 DIAGNOSIS — E1122 Type 2 diabetes mellitus with diabetic chronic kidney disease: Secondary | ICD-10-CM | POA: Diagnosis not present

## 2017-11-09 DIAGNOSIS — N183 Chronic kidney disease, stage 3 (moderate): Secondary | ICD-10-CM | POA: Diagnosis not present

## 2017-11-09 DIAGNOSIS — L89612 Pressure ulcer of right heel, stage 2: Secondary | ICD-10-CM | POA: Diagnosis not present

## 2017-11-09 DIAGNOSIS — D631 Anemia in chronic kidney disease: Secondary | ICD-10-CM | POA: Diagnosis not present

## 2017-11-09 DIAGNOSIS — E669 Obesity, unspecified: Secondary | ICD-10-CM | POA: Diagnosis not present

## 2017-11-09 DIAGNOSIS — I130 Hypertensive heart and chronic kidney disease with heart failure and stage 1 through stage 4 chronic kidney disease, or unspecified chronic kidney disease: Secondary | ICD-10-CM | POA: Diagnosis not present

## 2017-11-09 DIAGNOSIS — I480 Paroxysmal atrial fibrillation: Secondary | ICD-10-CM | POA: Diagnosis not present

## 2017-11-09 DIAGNOSIS — E1140 Type 2 diabetes mellitus with diabetic neuropathy, unspecified: Secondary | ICD-10-CM | POA: Diagnosis not present

## 2017-11-09 DIAGNOSIS — Z23 Encounter for immunization: Secondary | ICD-10-CM | POA: Diagnosis not present

## 2017-11-09 DIAGNOSIS — I509 Heart failure, unspecified: Secondary | ICD-10-CM | POA: Diagnosis not present

## 2017-11-09 DIAGNOSIS — R808 Other proteinuria: Secondary | ICD-10-CM | POA: Diagnosis not present

## 2017-11-09 DIAGNOSIS — Z6832 Body mass index (BMI) 32.0-32.9, adult: Secondary | ICD-10-CM | POA: Diagnosis not present

## 2017-11-09 DIAGNOSIS — E7800 Pure hypercholesterolemia, unspecified: Secondary | ICD-10-CM | POA: Diagnosis not present

## 2017-11-09 DIAGNOSIS — Z7901 Long term (current) use of anticoagulants: Secondary | ICD-10-CM | POA: Diagnosis not present

## 2017-11-09 DIAGNOSIS — I5033 Acute on chronic diastolic (congestive) heart failure: Secondary | ICD-10-CM | POA: Diagnosis not present

## 2017-11-09 DIAGNOSIS — I10 Essential (primary) hypertension: Secondary | ICD-10-CM | POA: Diagnosis not present

## 2017-11-09 DIAGNOSIS — E1129 Type 2 diabetes mellitus with other diabetic kidney complication: Secondary | ICD-10-CM | POA: Diagnosis not present

## 2017-11-09 DIAGNOSIS — S81812D Laceration without foreign body, left lower leg, subsequent encounter: Secondary | ICD-10-CM | POA: Diagnosis not present

## 2017-11-09 DIAGNOSIS — N182 Chronic kidney disease, stage 2 (mild): Secondary | ICD-10-CM | POA: Diagnosis not present

## 2017-11-10 DIAGNOSIS — D631 Anemia in chronic kidney disease: Secondary | ICD-10-CM | POA: Diagnosis not present

## 2017-11-10 DIAGNOSIS — N183 Chronic kidney disease, stage 3 (moderate): Secondary | ICD-10-CM | POA: Diagnosis not present

## 2017-11-10 DIAGNOSIS — S81812D Laceration without foreign body, left lower leg, subsequent encounter: Secondary | ICD-10-CM | POA: Diagnosis not present

## 2017-11-10 DIAGNOSIS — I130 Hypertensive heart and chronic kidney disease with heart failure and stage 1 through stage 4 chronic kidney disease, or unspecified chronic kidney disease: Secondary | ICD-10-CM | POA: Diagnosis not present

## 2017-11-10 DIAGNOSIS — I5033 Acute on chronic diastolic (congestive) heart failure: Secondary | ICD-10-CM | POA: Diagnosis not present

## 2017-11-10 DIAGNOSIS — E1122 Type 2 diabetes mellitus with diabetic chronic kidney disease: Secondary | ICD-10-CM | POA: Diagnosis not present

## 2017-11-13 DIAGNOSIS — D631 Anemia in chronic kidney disease: Secondary | ICD-10-CM | POA: Diagnosis not present

## 2017-11-13 DIAGNOSIS — I130 Hypertensive heart and chronic kidney disease with heart failure and stage 1 through stage 4 chronic kidney disease, or unspecified chronic kidney disease: Secondary | ICD-10-CM | POA: Diagnosis not present

## 2017-11-13 DIAGNOSIS — E1122 Type 2 diabetes mellitus with diabetic chronic kidney disease: Secondary | ICD-10-CM | POA: Diagnosis not present

## 2017-11-13 DIAGNOSIS — I5033 Acute on chronic diastolic (congestive) heart failure: Secondary | ICD-10-CM | POA: Diagnosis not present

## 2017-11-13 DIAGNOSIS — S81812D Laceration without foreign body, left lower leg, subsequent encounter: Secondary | ICD-10-CM | POA: Diagnosis not present

## 2017-11-13 DIAGNOSIS — N183 Chronic kidney disease, stage 3 (moderate): Secondary | ICD-10-CM | POA: Diagnosis not present

## 2017-11-16 DIAGNOSIS — E1122 Type 2 diabetes mellitus with diabetic chronic kidney disease: Secondary | ICD-10-CM | POA: Diagnosis not present

## 2017-11-16 DIAGNOSIS — D631 Anemia in chronic kidney disease: Secondary | ICD-10-CM | POA: Diagnosis not present

## 2017-11-16 DIAGNOSIS — I130 Hypertensive heart and chronic kidney disease with heart failure and stage 1 through stage 4 chronic kidney disease, or unspecified chronic kidney disease: Secondary | ICD-10-CM | POA: Diagnosis not present

## 2017-11-16 DIAGNOSIS — N183 Chronic kidney disease, stage 3 (moderate): Secondary | ICD-10-CM | POA: Diagnosis not present

## 2017-11-16 DIAGNOSIS — I5033 Acute on chronic diastolic (congestive) heart failure: Secondary | ICD-10-CM | POA: Diagnosis not present

## 2017-11-16 DIAGNOSIS — S81812D Laceration without foreign body, left lower leg, subsequent encounter: Secondary | ICD-10-CM | POA: Diagnosis not present

## 2017-11-17 DIAGNOSIS — N183 Chronic kidney disease, stage 3 (moderate): Secondary | ICD-10-CM | POA: Diagnosis not present

## 2017-11-17 DIAGNOSIS — D631 Anemia in chronic kidney disease: Secondary | ICD-10-CM | POA: Diagnosis not present

## 2017-11-17 DIAGNOSIS — S81812D Laceration without foreign body, left lower leg, subsequent encounter: Secondary | ICD-10-CM | POA: Diagnosis not present

## 2017-11-17 DIAGNOSIS — E1122 Type 2 diabetes mellitus with diabetic chronic kidney disease: Secondary | ICD-10-CM | POA: Diagnosis not present

## 2017-11-17 DIAGNOSIS — I5033 Acute on chronic diastolic (congestive) heart failure: Secondary | ICD-10-CM | POA: Diagnosis not present

## 2017-11-17 DIAGNOSIS — I130 Hypertensive heart and chronic kidney disease with heart failure and stage 1 through stage 4 chronic kidney disease, or unspecified chronic kidney disease: Secondary | ICD-10-CM | POA: Diagnosis not present

## 2017-11-20 DIAGNOSIS — E1142 Type 2 diabetes mellitus with diabetic polyneuropathy: Secondary | ICD-10-CM | POA: Diagnosis not present

## 2017-11-25 DIAGNOSIS — E1122 Type 2 diabetes mellitus with diabetic chronic kidney disease: Secondary | ICD-10-CM | POA: Diagnosis not present

## 2017-11-25 DIAGNOSIS — I5033 Acute on chronic diastolic (congestive) heart failure: Secondary | ICD-10-CM | POA: Diagnosis not present

## 2017-11-25 DIAGNOSIS — I130 Hypertensive heart and chronic kidney disease with heart failure and stage 1 through stage 4 chronic kidney disease, or unspecified chronic kidney disease: Secondary | ICD-10-CM | POA: Diagnosis not present

## 2017-11-25 DIAGNOSIS — N183 Chronic kidney disease, stage 3 (moderate): Secondary | ICD-10-CM | POA: Diagnosis not present

## 2017-11-25 DIAGNOSIS — D631 Anemia in chronic kidney disease: Secondary | ICD-10-CM | POA: Diagnosis not present

## 2017-11-25 DIAGNOSIS — S81812D Laceration without foreign body, left lower leg, subsequent encounter: Secondary | ICD-10-CM | POA: Diagnosis not present

## 2017-12-04 DIAGNOSIS — D631 Anemia in chronic kidney disease: Secondary | ICD-10-CM | POA: Diagnosis not present

## 2017-12-04 DIAGNOSIS — S81812D Laceration without foreign body, left lower leg, subsequent encounter: Secondary | ICD-10-CM | POA: Diagnosis not present

## 2017-12-04 DIAGNOSIS — N183 Chronic kidney disease, stage 3 (moderate): Secondary | ICD-10-CM | POA: Diagnosis not present

## 2017-12-04 DIAGNOSIS — I130 Hypertensive heart and chronic kidney disease with heart failure and stage 1 through stage 4 chronic kidney disease, or unspecified chronic kidney disease: Secondary | ICD-10-CM | POA: Diagnosis not present

## 2017-12-04 DIAGNOSIS — I5033 Acute on chronic diastolic (congestive) heart failure: Secondary | ICD-10-CM | POA: Diagnosis not present

## 2017-12-04 DIAGNOSIS — E1122 Type 2 diabetes mellitus with diabetic chronic kidney disease: Secondary | ICD-10-CM | POA: Diagnosis not present

## 2017-12-08 DIAGNOSIS — I5033 Acute on chronic diastolic (congestive) heart failure: Secondary | ICD-10-CM | POA: Diagnosis not present

## 2017-12-08 DIAGNOSIS — N183 Chronic kidney disease, stage 3 (moderate): Secondary | ICD-10-CM | POA: Diagnosis not present

## 2017-12-08 DIAGNOSIS — D631 Anemia in chronic kidney disease: Secondary | ICD-10-CM | POA: Diagnosis not present

## 2017-12-08 DIAGNOSIS — I130 Hypertensive heart and chronic kidney disease with heart failure and stage 1 through stage 4 chronic kidney disease, or unspecified chronic kidney disease: Secondary | ICD-10-CM | POA: Diagnosis not present

## 2017-12-08 DIAGNOSIS — E1122 Type 2 diabetes mellitus with diabetic chronic kidney disease: Secondary | ICD-10-CM | POA: Diagnosis not present

## 2017-12-08 DIAGNOSIS — S81812D Laceration without foreign body, left lower leg, subsequent encounter: Secondary | ICD-10-CM | POA: Diagnosis not present

## 2017-12-11 DIAGNOSIS — L97412 Non-pressure chronic ulcer of right heel and midfoot with fat layer exposed: Secondary | ICD-10-CM | POA: Diagnosis not present

## 2017-12-15 DIAGNOSIS — N183 Chronic kidney disease, stage 3 (moderate): Secondary | ICD-10-CM | POA: Diagnosis not present

## 2017-12-15 DIAGNOSIS — S81812D Laceration without foreign body, left lower leg, subsequent encounter: Secondary | ICD-10-CM | POA: Diagnosis not present

## 2017-12-15 DIAGNOSIS — D631 Anemia in chronic kidney disease: Secondary | ICD-10-CM | POA: Diagnosis not present

## 2017-12-15 DIAGNOSIS — I5033 Acute on chronic diastolic (congestive) heart failure: Secondary | ICD-10-CM | POA: Diagnosis not present

## 2017-12-15 DIAGNOSIS — I130 Hypertensive heart and chronic kidney disease with heart failure and stage 1 through stage 4 chronic kidney disease, or unspecified chronic kidney disease: Secondary | ICD-10-CM | POA: Diagnosis not present

## 2017-12-15 DIAGNOSIS — E1122 Type 2 diabetes mellitus with diabetic chronic kidney disease: Secondary | ICD-10-CM | POA: Diagnosis not present

## 2017-12-17 DIAGNOSIS — N183 Chronic kidney disease, stage 3 (moderate): Secondary | ICD-10-CM | POA: Diagnosis not present

## 2017-12-17 DIAGNOSIS — E1122 Type 2 diabetes mellitus with diabetic chronic kidney disease: Secondary | ICD-10-CM | POA: Diagnosis not present

## 2017-12-17 DIAGNOSIS — S81812D Laceration without foreign body, left lower leg, subsequent encounter: Secondary | ICD-10-CM | POA: Diagnosis not present

## 2017-12-17 DIAGNOSIS — I5033 Acute on chronic diastolic (congestive) heart failure: Secondary | ICD-10-CM | POA: Diagnosis not present

## 2017-12-17 DIAGNOSIS — I130 Hypertensive heart and chronic kidney disease with heart failure and stage 1 through stage 4 chronic kidney disease, or unspecified chronic kidney disease: Secondary | ICD-10-CM | POA: Diagnosis not present

## 2017-12-17 DIAGNOSIS — D631 Anemia in chronic kidney disease: Secondary | ICD-10-CM | POA: Diagnosis not present

## 2017-12-21 DIAGNOSIS — E1122 Type 2 diabetes mellitus with diabetic chronic kidney disease: Secondary | ICD-10-CM | POA: Diagnosis not present

## 2017-12-21 DIAGNOSIS — I130 Hypertensive heart and chronic kidney disease with heart failure and stage 1 through stage 4 chronic kidney disease, or unspecified chronic kidney disease: Secondary | ICD-10-CM | POA: Diagnosis not present

## 2017-12-21 DIAGNOSIS — N183 Chronic kidney disease, stage 3 (moderate): Secondary | ICD-10-CM | POA: Diagnosis not present

## 2017-12-21 DIAGNOSIS — S81812D Laceration without foreign body, left lower leg, subsequent encounter: Secondary | ICD-10-CM | POA: Diagnosis not present

## 2017-12-21 DIAGNOSIS — I5033 Acute on chronic diastolic (congestive) heart failure: Secondary | ICD-10-CM | POA: Diagnosis not present

## 2017-12-21 DIAGNOSIS — D631 Anemia in chronic kidney disease: Secondary | ICD-10-CM | POA: Diagnosis not present

## 2017-12-22 DIAGNOSIS — S81812D Laceration without foreign body, left lower leg, subsequent encounter: Secondary | ICD-10-CM | POA: Diagnosis not present

## 2017-12-22 DIAGNOSIS — E1122 Type 2 diabetes mellitus with diabetic chronic kidney disease: Secondary | ICD-10-CM | POA: Diagnosis not present

## 2017-12-22 DIAGNOSIS — I5033 Acute on chronic diastolic (congestive) heart failure: Secondary | ICD-10-CM | POA: Diagnosis not present

## 2017-12-22 DIAGNOSIS — N183 Chronic kidney disease, stage 3 (moderate): Secondary | ICD-10-CM | POA: Diagnosis not present

## 2017-12-22 DIAGNOSIS — D631 Anemia in chronic kidney disease: Secondary | ICD-10-CM | POA: Diagnosis not present

## 2017-12-22 DIAGNOSIS — I130 Hypertensive heart and chronic kidney disease with heart failure and stage 1 through stage 4 chronic kidney disease, or unspecified chronic kidney disease: Secondary | ICD-10-CM | POA: Diagnosis not present

## 2017-12-24 DIAGNOSIS — D631 Anemia in chronic kidney disease: Secondary | ICD-10-CM | POA: Diagnosis not present

## 2017-12-24 DIAGNOSIS — E1122 Type 2 diabetes mellitus with diabetic chronic kidney disease: Secondary | ICD-10-CM | POA: Diagnosis not present

## 2017-12-24 DIAGNOSIS — I5033 Acute on chronic diastolic (congestive) heart failure: Secondary | ICD-10-CM | POA: Diagnosis not present

## 2017-12-24 DIAGNOSIS — N183 Chronic kidney disease, stage 3 (moderate): Secondary | ICD-10-CM | POA: Diagnosis not present

## 2017-12-24 DIAGNOSIS — I130 Hypertensive heart and chronic kidney disease with heart failure and stage 1 through stage 4 chronic kidney disease, or unspecified chronic kidney disease: Secondary | ICD-10-CM | POA: Diagnosis not present

## 2017-12-24 DIAGNOSIS — S81812D Laceration without foreign body, left lower leg, subsequent encounter: Secondary | ICD-10-CM | POA: Diagnosis not present

## 2017-12-25 DIAGNOSIS — E1140 Type 2 diabetes mellitus with diabetic neuropathy, unspecified: Secondary | ICD-10-CM | POA: Diagnosis not present

## 2017-12-25 DIAGNOSIS — E1129 Type 2 diabetes mellitus with other diabetic kidney complication: Secondary | ICD-10-CM | POA: Diagnosis not present

## 2017-12-25 DIAGNOSIS — I509 Heart failure, unspecified: Secondary | ICD-10-CM | POA: Diagnosis not present

## 2017-12-25 DIAGNOSIS — Z7901 Long term (current) use of anticoagulants: Secondary | ICD-10-CM | POA: Diagnosis not present

## 2017-12-25 DIAGNOSIS — I10 Essential (primary) hypertension: Secondary | ICD-10-CM | POA: Diagnosis not present

## 2017-12-25 DIAGNOSIS — Z1389 Encounter for screening for other disorder: Secondary | ICD-10-CM | POA: Diagnosis not present

## 2017-12-25 DIAGNOSIS — Z23 Encounter for immunization: Secondary | ICD-10-CM | POA: Diagnosis not present

## 2017-12-25 DIAGNOSIS — Z6831 Body mass index (BMI) 31.0-31.9, adult: Secondary | ICD-10-CM | POA: Diagnosis not present

## 2017-12-25 DIAGNOSIS — E7800 Pure hypercholesterolemia, unspecified: Secondary | ICD-10-CM | POA: Diagnosis not present

## 2017-12-25 DIAGNOSIS — I480 Paroxysmal atrial fibrillation: Secondary | ICD-10-CM | POA: Diagnosis not present

## 2017-12-25 DIAGNOSIS — N182 Chronic kidney disease, stage 2 (mild): Secondary | ICD-10-CM | POA: Diagnosis not present

## 2017-12-25 DIAGNOSIS — E668 Other obesity: Secondary | ICD-10-CM | POA: Diagnosis not present

## 2017-12-28 DIAGNOSIS — L97412 Non-pressure chronic ulcer of right heel and midfoot with fat layer exposed: Secondary | ICD-10-CM | POA: Diagnosis not present

## 2017-12-28 DIAGNOSIS — L97511 Non-pressure chronic ulcer of other part of right foot limited to breakdown of skin: Secondary | ICD-10-CM | POA: Diagnosis not present

## 2017-12-29 ENCOUNTER — Encounter: Payer: Self-pay | Admitting: Cardiology

## 2017-12-29 ENCOUNTER — Ambulatory Visit (INDEPENDENT_AMBULATORY_CARE_PROVIDER_SITE_OTHER): Payer: Medicare Other | Admitting: Cardiology

## 2017-12-29 DIAGNOSIS — I208 Other forms of angina pectoris: Secondary | ICD-10-CM

## 2017-12-29 DIAGNOSIS — I4819 Other persistent atrial fibrillation: Secondary | ICD-10-CM | POA: Diagnosis not present

## 2017-12-29 DIAGNOSIS — I5033 Acute on chronic diastolic (congestive) heart failure: Secondary | ICD-10-CM

## 2017-12-29 DIAGNOSIS — R600 Localized edema: Secondary | ICD-10-CM

## 2017-12-29 MED ORDER — METOLAZONE 2.5 MG PO TABS: 2.5000 mg | ORAL_TABLET | ORAL | 3 refills | Status: DC

## 2017-12-29 NOTE — Patient Instructions (Addendum)
Medication Instructions:   DO NOT TAKE METOLAZONE  FOR 2 DAYS , THEN  RESTART TAKING METOLAZONE 2.5 MG TABLET  EVERY OTHER DAY , 30 MINUTES BEFORE THE MORNING DOSE OF FUROSEMIDE.  If you need a refill on your cardiac medications before your next appointment, please call your pharmacy.   Lab work: NOT NEEDED If you have labs (blood work) drawn today and your tests are completely normal, you will receive your results only by: Marland Kitchen MyChart Message (if you have MyChart) OR . A paper copy in the mail If you have any lab test that is abnormal or we need to change your treatment, we will call you to review the results.  Testing/Procedures: NOT NEEEDED  Follow-Up: At [facility], you and your health needs are our priority.  As part of our continuing mission to provide you with exceptional heart care, we have created designated Provider Care Teams.  These Care Teams include your primary Cardiologist (physician) and Advanced Practice Providers (APPs -  Physician Assistants and Nurse Practitioners) who all work together to provide you with the care you need, when you need it. You will need a follow up appointment in 6 months.  Please call our office 2 months in advance to schedule this appointment.  You may see Glenetta Hew, MD or one of the following Advanced Practice Providers on your designated Care Team:   Rosaria Ferries, PA-C . Jory Sims, DNP, ANP  Any Other Special Instructions Will Be Listed Below (If Applicable).

## 2017-12-29 NOTE — Progress Notes (Signed)
PCP: Haywood Pao, MD  Clinic Note: Chief Complaint  Patient presents with  . Follow-up    Doing better  . Atrial Fibrillation    Stable  . Congestive Heart Failure    Mostly diastolic    HPI: Charles Friedman is a 84 y.o. male with a PMH notable for atrial fibrillation with chronic diastolic heart failure, history of diabetes and stroke who presents today for ~ 2 month f/u.   He is accompanied by his caregiver who I actually thought was his daughter.  Charles Friedman was last seen by me on Sept 3, 19 for hospital follow-up feeling much better.  Edema was notably improved.  Still having some orthopnea but no PND.  Noticing exertional dyspnea but really not all that active.  No sensation of A. fib.  Recent Hospitalizations:   None since September 2  Studies Personally Reviewed - (if available, images/films reviewed: From Epic Chart or Care Everywhere)  None.   Interval History: Charles Friedman presents today for 2nd post-hospital follow-up.  Symptomatically he seems to be doing mcuh better.  Edema gon. Wgt bck to his baseline.  Recent labs with Increased Cr & BUN -- > told to change metolazone to q.o.d. he says he has been making a slow recovery since the hospital stay, but starting to feel better.  Still having a hard time walking, and does not like using a walker.  No real significant exertional dyspnea besides mild baseline--mostly he is is limited by Charles Friedman arthritis pains.  He just hurts all over..  No chest pain or pressure.  No sensation of A. fib irregular rapid heartbeats. No syncope or near syncope.  No TIA or amaurosis fugax.  As far as volume status, he seems to be pretty much near his dry weight.  Still has unsteady gait, but no significant falls lately  No claudication.  ROS: A comprehensive was performed. Review of Systems  Constitutional: Positive for malaise/fatigue (Not enough energy for the effort).  HENT: Negative for nosebleeds.   Respiratory: Negative for shortness of  breath.   Cardiovascular: Positive for leg swelling. Negative for chest pain.  Gastrointestinal: Negative for abdominal pain, blood in stool and melena.  Genitourinary: Negative for dysuria and hematuria.  Musculoskeletal: Positive for joint pain. Negative for falls (Not since he fell out of bed.).  Neurological: Positive for dizziness.       Poor balance, unsteady gait.  Endo/Heme/Allergies: Bruises/bleeds easily.  Psychiatric/Behavioral: Positive for memory loss. Negative for depression (Doing better).  All other systems reviewed and are negative.  I have reviewed and (if needed) personally updated the patient's problem list, medications, allergies, past medical and surgical history, social and family history.   Past Medical History:  Diagnosis Date  . Atrial fibrillation ([place]) 2003   Initially on Coumadin, now on Xarelto  . Cataract    Bilateral cataracts.  . Diabetes mellitus without complication ([place])    type II  . Diabetic peripheral neuropathy associated with type 2 diabetes mellitus ([place])   . Glaucoma    left eye  . Hx of radiation therapy 08/15/13- 10/06/13   prostate 7600 cGy 38 sessions  . Hyperlipidemia with target LDL less than 70   . Neuropathy    diabetic, in feet  . Personal history of fall 09/26/13   front entrance of [place]  . Prostate cancer [facility])    History of radiation therapy    Past Surgical History:  Procedure Laterality Date  . CAROTID DOPPLERS  11/2015  Mild plaque bilaterally.  Less than 40%.  Antegrade vertebral flow.  Marland Kitchen PROSTATE BIOPSY  12/25/11   Adenocarcinoma, gleason 6  . TRANSTHORACIC ECHOCARDIOGRAM  11/2015   In setting of CVA: Technically difficult.  Normal LV size and function.  EF 55-60%.  No regional wall motion normality.  Moderate LA dilation, mild RA dilation.  Mildly increased PA pressures.  . TRANSTHORACIC ECHOCARDIOGRAM  10/20/2017   Low normal LV function.  EF 50 to 55%.  No R WMA.  Moderate LA dilation.  Mild to moderate RV  dilation with mildly reduced function -however no significant tricuspid regurgitation noted.  Mild R RA dilation.      Current Meds  Medication Sig  . acetaminophen (TYLENOL) 325 MG tablet Take 2 tablets (650 mg total) by mouth every 6 (six) hours as needed for mild pain or moderate pain.  Marland Kitchen atorvastatin (LIPITOR) 40 MG tablet Take 1 tablet (40 mg total) by mouth daily at 6 PM.  . escitalopram (LEXAPRO) 5 MG tablet Take 5 mg by mouth daily.  . furosemide (LASIX) 40 MG tablet Take 1 tablet (40 mg total) by mouth 2 (two) times daily. MAY TAKE AN EXTRA 40 MG IN THE MORNING IF WEIGHT IS ABOVE 3 LBS.  Marland Kitchen insulin glargine (LANTUS) 100 UNIT/ML injection Inject 0.35 mLs (35 Units total) into the skin daily.  . insulin lispro (HUMALOG) 100 UNIT/ML injection Inject 0-0.09 mLs (0-9 Units total) into the skin 3 (three) times daily with meals. Correction coverage: Sensitive (thin, NPO, renal) CBG < 70: implement hypoglycemia protocol CBG 70 - 120: 0 units CBG 121 - 150: 1 unit CBG 151 - 200: 2 units CBG 201 - 250: 3 units CBG 251 - 300: 5 units CBG 301 - 350: 7 units CBG 351 - 400: 9 units CBG > 400: call MD. (Patient taking differently: Inject 5 Units into the skin 3 (three) times daily with meals. Correction coverage: Sensitive (thin, NPO, renal) CBG < 70: implement hypoglycemia protocol CBG 70 - 120: 0 units CBG 121 - 150: 1 unit CBG 151 - 200: 2 units CBG 201 - 250: 3 units CBG 251 - 300: 5 units CBG 301 - 350: 7 units CBG 351 - 400: 9 units CBG > 400: call MD.)  . LYRICA 75 MG capsule 75 mg 2 (two) times daily.   . metolazone (ZAROXOLYN) 2.5 MG tablet Take 1 tablet (2.5 mg total) by mouth every other day.  . metoprolol tartrate (LOPRESSOR) 25 MG tablet Take 1 tablet (25 mg total) by mouth 2 (two) times daily.  . rivaroxaban (XARELTO) 20 MG TABS tablet Take 1 tablet (20 mg total) by mouth daily with supper.  . tamsulosin (FLOMAX) 0.4 MG CAPS capsule Take 0.4 mg by mouth daily.  .  [DISCONTINUED] metolazone (ZAROXOLYN) 2.5 MG tablet     Allergies  Allergen Reactions  . Aureomycin [Chlortetracycline] Itching and Rash    Reaction approximately 1956    Social History   Tobacco Use  . Smoking status: Never Smoker  . Smokeless tobacco: Never Used  Substance Use Topics  . Alcohol use: No  . Drug use: No   Social History   Social History Narrative   Pt lives alone -widowed with long-term companion / caregiver.   Currently lives in assisted living facility.   Has 2 sons, one in [place], [place] and the other in [place].     His son Charles Friedman. lives here locally.  Phone [phone].   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    family history includes Cancer (age of onset: 37) in his sister; Heart attack (age of onset: 22) in his father; Heart disease in his mother.  Wt Readings from Last 3 Encounters:  12/29/17 236 lb 12.8 oz (107.4 kg)  10/29/17 249 lb 12.8 oz (113.3 kg)  10/23/17 244 lb (110.7 kg)    PHYSICAL EXAM BP (!) 116/58   Pulse 92   Ht 6\' 1"  (1.854 m)   Wt 236 lb 12.8 oz (107.4 kg)   SpO2 94%   BMI 31.24 kg/m  Physical Exam  Constitutional: He is oriented to person, place, and time. He appears well-developed and well-nourished. No distress.  Flushed appearing.  Somewhat confused  HENT:  Head: Normocephalic and atraumatic.  Ruddy complexion  Neck: Hepatojugular reflux (with cannon A waves from A fib) present. No JVD present. Carotid bruit is not present.  Cardiovascular: Normal rate, S1 normal and S2 normal. An irregularly irregular rhythm present. PMI is not displaced. Exam reveals distant heart sounds and decreased pulses (Decreased pedal pulses due to edema.). Exam reveals no gallop and no friction rub.  No murmur heard. Pulmonary/Chest: Effort normal and breath sounds normal. No respiratory distress. He has no wheezes. He has no rales.  Decreased breath sounds in bases -clears up with cough    Abdominal: Soft. Bowel sounds are normal. He exhibits no distension. There is no tenderness. There is no rebound.  No HSM  Musculoskeletal: Normal range of motion. He exhibits edema (Trivial bilateral disease with venous stasis changes.  Not currently wearing support stockings).  Neurological: He is alert and oriented to person, place, and time.  Skin:  stasis dermatitis  Psychiatric: He has a normal mood and affect. Thought content normal.  Answer more questions today than last visit.  However, still gives difference to caregiver to answer most questions.  Partially because of memory issues.  Vitals reviewed.   Adult ECG Report n/a  Other studies Reviewed: Additional studies/ records that were reviewed today include:  Recent Labs:   Lab Results  Component Value Date   CREATININE 1.27 (H) 10/23/2017   BUN 42 (H) 10/23/2017   NA 137 10/23/2017   K 3.7 10/23/2017   CL 101 10/23/2017   CO2 26 10/23/2017     ASSESSMENT / PLAN: Problem List Items Addressed This Visit    Acute on chronic diastolic CHF (congestive heart failure), NYHA class 2 (HCC) (Chronic)    Pretty much completed his exacerbation of his heart failure.  I agree that we can back off on metolazone every other day as well as as needed.  Continue current Lasix dose sliding scale as previously discussed.  Go to every other day with metolazone Is on low-dose beta-blocker with no additional blood pressure room to adjust.      Relevant Medications   metolazone (ZAROXOLYN) 2.5 MG tablet   Bilateral edema of lower extremity (Chronic)    In part related to some diastolic heart failure, but also can be combined with venous stasis changes. Continue with current Lasix dose and recommend support stockings from about mid feet up to midcalf.      Persistent atrial fibrillation (Chronic)    Seems relatively stable.  No symptoms of recurrent episodes. Continue low-dose metoprolol for rate control as well as Xarelto.  No  bleeding issues      Relevant Medications   metolazone (ZAROXOLYN) 2.5 MG tablet      Current medicines  are reviewed at length with the patient today.  (+/- concerns) - doing The following changes have been made:  See below  Patient Instructions  Medication Instructions:   DO NOT TAKE METOLAZONE  FOR 2 DAYS , THEN  RESTART TAKING METOLAZONE 2.5 MG TABLET  EVERY OTHER DAY , 30 MINUTES BEFORE THE MORNING DOSE OF FUROSEMIDE.  If you need a refill on your cardiac medications before your next appointment, please call your pharmacy.   Lab work: NOT NEEDED If you have labs (blood work) drawn today and your tests are completely normal, you will receive your results only by: Marland Kitchen MyChart Message (if you have MyChart) OR . A paper copy in the mail If you have any lab test that is abnormal or we need to change your treatment, we will call you to review the results.  Testing/Procedures: NOT NEEEDED  Follow-Up: At [facility], you and your health needs are our priority.  As part of our continuing mission to provide you with exceptional heart care, we have created designated Provider Care Teams.  These Care Teams include your primary Cardiologist (physician) and Advanced Practice Providers (APPs -  Physician Assistants and Nurse Practitioners) who all work together to provide you with the care you need, when you need it. You will need a follow up appointment in 6 months.  Please call our office 2 months in advance to schedule this appointment.  You may see Glenetta Hew, MD or one of the following Advanced Practice Providers on your designated Care Team:   Rosaria Ferries, PA-C . Jory Sims, DNP, ANP  Any Other Special Instructions Will Be Listed Below (If Applicable).     Studies Ordered:   No orders of the defined types were placed in this encounter.     Glenetta Hew, M.D., M.S. Interventional Cardiologist   Pager # [phone] Phone # [phone] [address]  [place], [place] [address]   Thank you for choosing [facility]!!

## 2017-12-30 DIAGNOSIS — D631 Anemia in chronic kidney disease: Secondary | ICD-10-CM | POA: Diagnosis not present

## 2017-12-30 DIAGNOSIS — N183 Chronic kidney disease, stage 3 (moderate): Secondary | ICD-10-CM | POA: Diagnosis not present

## 2017-12-30 DIAGNOSIS — E1122 Type 2 diabetes mellitus with diabetic chronic kidney disease: Secondary | ICD-10-CM | POA: Diagnosis not present

## 2017-12-30 DIAGNOSIS — I5033 Acute on chronic diastolic (congestive) heart failure: Secondary | ICD-10-CM | POA: Diagnosis not present

## 2017-12-30 DIAGNOSIS — S81812D Laceration without foreign body, left lower leg, subsequent encounter: Secondary | ICD-10-CM | POA: Diagnosis not present

## 2017-12-30 DIAGNOSIS — I130 Hypertensive heart and chronic kidney disease with heart failure and stage 1 through stage 4 chronic kidney disease, or unspecified chronic kidney disease: Secondary | ICD-10-CM | POA: Diagnosis not present

## 2017-12-31 ENCOUNTER — Encounter: Payer: Self-pay | Admitting: Cardiology

## 2017-12-31 NOTE — Assessment & Plan Note (Signed)
In part related to some diastolic heart failure, but also can be combined with venous stasis changes. Continue with current Lasix dose and recommend support stockings from about mid feet up to midcalf.

## 2017-12-31 NOTE — Assessment & Plan Note (Signed)
Seems relatively stable.  No symptoms of recurrent episodes. Continue low-dose metoprolol for rate control as well as Xarelto.  No bleeding issues

## 2017-12-31 NOTE — Assessment & Plan Note (Addendum)
Pretty much completed his exacerbation of his heart failure.  I agree that we can back off on metolazone every other day as well as as needed.  Continue current Lasix dose sliding scale as previously discussed.  Go to every other day with metolazone Is on low-dose beta-blocker with no additional blood pressure room to adjust.

## 2018-01-01 ENCOUNTER — Other Ambulatory Visit: Payer: Self-pay

## 2018-01-01 DIAGNOSIS — S81812D Laceration without foreign body, left lower leg, subsequent encounter: Secondary | ICD-10-CM | POA: Diagnosis not present

## 2018-01-01 DIAGNOSIS — E1122 Type 2 diabetes mellitus with diabetic chronic kidney disease: Secondary | ICD-10-CM | POA: Diagnosis not present

## 2018-01-01 DIAGNOSIS — L97909 Non-pressure chronic ulcer of unspecified part of unspecified lower leg with unspecified severity: Principal | ICD-10-CM

## 2018-01-01 DIAGNOSIS — I130 Hypertensive heart and chronic kidney disease with heart failure and stage 1 through stage 4 chronic kidney disease, or unspecified chronic kidney disease: Secondary | ICD-10-CM | POA: Diagnosis not present

## 2018-01-01 DIAGNOSIS — N183 Chronic kidney disease, stage 3 (moderate): Secondary | ICD-10-CM | POA: Diagnosis not present

## 2018-01-01 DIAGNOSIS — I83009 Varicose veins of unspecified lower extremity with ulcer of unspecified site: Secondary | ICD-10-CM

## 2018-01-01 DIAGNOSIS — D631 Anemia in chronic kidney disease: Secondary | ICD-10-CM | POA: Diagnosis not present

## 2018-01-01 DIAGNOSIS — I5033 Acute on chronic diastolic (congestive) heart failure: Secondary | ICD-10-CM | POA: Diagnosis not present

## 2018-01-05 DIAGNOSIS — N183 Chronic kidney disease, stage 3 (moderate): Secondary | ICD-10-CM | POA: Diagnosis not present

## 2018-01-05 DIAGNOSIS — D631 Anemia in chronic kidney disease: Secondary | ICD-10-CM | POA: Diagnosis not present

## 2018-01-05 DIAGNOSIS — I130 Hypertensive heart and chronic kidney disease with heart failure and stage 1 through stage 4 chronic kidney disease, or unspecified chronic kidney disease: Secondary | ICD-10-CM | POA: Diagnosis not present

## 2018-01-05 DIAGNOSIS — S81812D Laceration without foreign body, left lower leg, subsequent encounter: Secondary | ICD-10-CM | POA: Diagnosis not present

## 2018-01-05 DIAGNOSIS — E1122 Type 2 diabetes mellitus with diabetic chronic kidney disease: Secondary | ICD-10-CM | POA: Diagnosis not present

## 2018-01-05 DIAGNOSIS — I5033 Acute on chronic diastolic (congestive) heart failure: Secondary | ICD-10-CM | POA: Diagnosis not present

## 2018-01-11 DIAGNOSIS — E1129 Type 2 diabetes mellitus with other diabetic kidney complication: Secondary | ICD-10-CM | POA: Diagnosis not present

## 2018-01-19 DIAGNOSIS — N183 Chronic kidney disease, stage 3 (moderate): Secondary | ICD-10-CM | POA: Diagnosis not present

## 2018-01-19 DIAGNOSIS — I10 Essential (primary) hypertension: Secondary | ICD-10-CM | POA: Diagnosis not present

## 2018-01-19 DIAGNOSIS — R2689 Other abnormalities of gait and mobility: Secondary | ICD-10-CM | POA: Diagnosis not present

## 2018-01-19 DIAGNOSIS — R531 Weakness: Secondary | ICD-10-CM | POA: Diagnosis not present

## 2018-01-19 DIAGNOSIS — R748 Abnormal levels of other serum enzymes: Secondary | ICD-10-CM | POA: Diagnosis not present

## 2018-01-20 ENCOUNTER — Other Ambulatory Visit: Payer: Self-pay | Admitting: Internal Medicine

## 2018-01-20 DIAGNOSIS — R748 Abnormal levels of other serum enzymes: Secondary | ICD-10-CM

## 2018-01-24 ENCOUNTER — Encounter (HOSPITAL_COMMUNITY): Payer: Self-pay

## 2018-01-24 ENCOUNTER — Other Ambulatory Visit: Payer: Self-pay

## 2018-01-24 ENCOUNTER — Emergency Department (HOSPITAL_COMMUNITY): Payer: Medicare Other

## 2018-01-24 ENCOUNTER — Inpatient Hospital Stay (HOSPITAL_COMMUNITY)
Admission: EM | Admit: 2018-01-24 | Discharge: 2018-01-28 | DRG: 292 | Disposition: A | Discharge status: Skilled Nursing Facility | Payer: Medicare Other | Attending: Internal Medicine | Admitting: Internal Medicine

## 2018-01-24 DIAGNOSIS — Z8546 Personal history of malignant neoplasm of prostate: Secondary | ICD-10-CM

## 2018-01-24 DIAGNOSIS — Z7901 Long term (current) use of anticoagulants: Secondary | ICD-10-CM

## 2018-01-24 DIAGNOSIS — R001 Bradycardia, unspecified: Secondary | ICD-10-CM | POA: Diagnosis present

## 2018-01-24 DIAGNOSIS — G629 Polyneuropathy, unspecified: Secondary | ICD-10-CM | POA: Diagnosis not present

## 2018-01-24 DIAGNOSIS — R404 Transient alteration of awareness: Secondary | ICD-10-CM | POA: Diagnosis not present

## 2018-01-24 DIAGNOSIS — I4819 Other persistent atrial fibrillation: Secondary | ICD-10-CM | POA: Diagnosis present

## 2018-01-24 DIAGNOSIS — R0602 Shortness of breath: Secondary | ICD-10-CM

## 2018-01-24 DIAGNOSIS — Z8249 Family history of ischemic heart disease and other diseases of the circulatory system: Secondary | ICD-10-CM

## 2018-01-24 DIAGNOSIS — I509 Heart failure, unspecified: Secondary | ICD-10-CM | POA: Diagnosis not present

## 2018-01-24 DIAGNOSIS — L8990 Pressure ulcer of unspecified site, unspecified stage: Secondary | ICD-10-CM

## 2018-01-24 DIAGNOSIS — R531 Weakness: Secondary | ICD-10-CM | POA: Diagnosis not present

## 2018-01-24 DIAGNOSIS — R0689 Other abnormalities of breathing: Secondary | ICD-10-CM | POA: Diagnosis not present

## 2018-01-24 DIAGNOSIS — Z79899 Other long term (current) drug therapy: Secondary | ICD-10-CM

## 2018-01-24 DIAGNOSIS — Z7401 Bed confinement status: Secondary | ICD-10-CM | POA: Diagnosis not present

## 2018-01-24 DIAGNOSIS — F419 Anxiety disorder, unspecified: Secondary | ICD-10-CM | POA: Diagnosis present

## 2018-01-24 DIAGNOSIS — R410 Disorientation, unspecified: Secondary | ICD-10-CM | POA: Diagnosis not present

## 2018-01-24 DIAGNOSIS — Z8673 Personal history of transient ischemic attack (TIA), and cerebral infarction without residual deficits: Secondary | ICD-10-CM

## 2018-01-24 DIAGNOSIS — E860 Dehydration: Secondary | ICD-10-CM | POA: Diagnosis present

## 2018-01-24 DIAGNOSIS — E1165 Type 2 diabetes mellitus with hyperglycemia: Secondary | ICD-10-CM | POA: Diagnosis present

## 2018-01-24 DIAGNOSIS — Z808 Family history of malignant neoplasm of other organs or systems: Secondary | ICD-10-CM | POA: Diagnosis not present

## 2018-01-24 DIAGNOSIS — N400 Enlarged prostate without lower urinary tract symptoms: Secondary | ICD-10-CM | POA: Diagnosis present

## 2018-01-24 DIAGNOSIS — Z923 Personal history of irradiation: Secondary | ICD-10-CM

## 2018-01-24 DIAGNOSIS — H409 Unspecified glaucoma: Secondary | ICD-10-CM | POA: Diagnosis present

## 2018-01-24 DIAGNOSIS — R4182 Altered mental status, unspecified: Secondary | ICD-10-CM | POA: Diagnosis present

## 2018-01-24 DIAGNOSIS — N289 Disorder of kidney and ureter, unspecified: Secondary | ICD-10-CM | POA: Diagnosis not present

## 2018-01-24 DIAGNOSIS — I5033 Acute on chronic diastolic (congestive) heart failure: Secondary | ICD-10-CM | POA: Diagnosis present

## 2018-01-24 DIAGNOSIS — J811 Chronic pulmonary edema: Secondary | ICD-10-CM | POA: Diagnosis present

## 2018-01-24 DIAGNOSIS — Z794 Long term (current) use of insulin: Secondary | ICD-10-CM | POA: Diagnosis not present

## 2018-01-24 DIAGNOSIS — I959 Hypotension, unspecified: Secondary | ICD-10-CM | POA: Diagnosis present

## 2018-01-24 DIAGNOSIS — E785 Hyperlipidemia, unspecified: Secondary | ICD-10-CM | POA: Diagnosis present

## 2018-01-24 DIAGNOSIS — E1142 Type 2 diabetes mellitus with diabetic polyneuropathy: Secondary | ICD-10-CM | POA: Diagnosis present

## 2018-01-24 DIAGNOSIS — Z888 Allergy status to other drugs, medicaments and biological substances status: Secondary | ICD-10-CM

## 2018-01-24 DIAGNOSIS — F329 Major depressive disorder, single episode, unspecified: Secondary | ICD-10-CM | POA: Diagnosis present

## 2018-01-24 DIAGNOSIS — R0902 Hypoxemia: Secondary | ICD-10-CM | POA: Diagnosis present

## 2018-01-24 DIAGNOSIS — R296 Repeated falls: Secondary | ICD-10-CM | POA: Diagnosis not present

## 2018-01-24 DIAGNOSIS — M2550 Pain in unspecified joint: Secondary | ICD-10-CM | POA: Diagnosis not present

## 2018-01-24 DIAGNOSIS — R0989 Other specified symptoms and signs involving the circulatory and respiratory systems: Secondary | ICD-10-CM

## 2018-01-24 LAB — COMPREHENSIVE METABOLIC PANEL
ALBUMIN: 2.8 g/dL — AB (ref 3.5–5.0)
ALT: 100 U/L — ABNORMAL HIGH (ref 0–44)
AST: 139 U/L — ABNORMAL HIGH (ref 15–41)
Alkaline Phosphatase: 123 U/L (ref 38–126)
Anion gap: 13 (ref 5–15)
BUN: 45 mg/dL — ABNORMAL HIGH (ref 8–23)
CALCIUM: 8.8 mg/dL — AB (ref 8.9–10.3)
CHLORIDE: 98 mmol/L (ref 98–111)
CO2: 28 mmol/L (ref 22–32)
Creatinine, Ser: 1.08 mg/dL (ref 0.61–1.24)
GFR calc non Af Amer: >60 mL/min (ref >60)
GLUCOSE: 147 mg/dL — AB (ref 70–99)
POTASSIUM: 3.8 mmol/L (ref 3.5–5.1)
SODIUM: 139 mmol/L (ref 135–145)
Total Bilirubin: 1 mg/dL (ref 0.3–1.2)
Total Protein: 6.9 g/dL (ref 6.5–8.1)

## 2018-01-24 LAB — I-STAT CG4 LACTIC ACID, ED: LACTIC ACID, VENOUS: 1.54 mmol/L (ref 0.5–1.9)

## 2018-01-24 LAB — CBC WITH DIFFERENTIAL/PLATELET
ABS IMMATURE GRANULOCYTES: 0.03 10*3/uL (ref 0.00–0.07)
BASOS ABS: 0.1 10*3/uL (ref 0.0–0.1)
BASOS PCT: 1 %
Eosinophils Absolute: 0.4 10*3/uL (ref 0.0–0.5)
Eosinophils Relative: 4 %
HEMATOCRIT: 44 % (ref 39.0–52.0)
Hemoglobin: 14.1 g/dL (ref 13.0–17.0)
IMMATURE GRANULOCYTES: 0 %
LYMPHS ABS: 1.8 10*3/uL (ref 0.7–4.0)
Lymphocytes Relative: 21 %
MCH: 29 pg (ref 26.0–34.0)
MCHC: 32 g/dL (ref 30.0–36.0)
MCV: 90.5 fL (ref 80.0–100.0)
MONOS PCT: 8 %
Monocytes Absolute: 0.7 10*3/uL (ref 0.1–1.0)
NEUTROS ABS: 5.9 10*3/uL (ref 1.7–7.7)
NEUTROS PCT: 66 %
NRBC: 0 % (ref 0.0–0.2)
PLATELETS: 266 10*3/uL (ref 150–400)
RBC: 4.86 MIL/uL (ref 4.22–5.81)
RDW: 13.3 % (ref 11.5–15.5)
WBC: 8.9 10*3/uL (ref 4.0–10.5)

## 2018-01-24 LAB — URINALYSIS, ROUTINE W REFLEX MICROSCOPIC
Bacteria, UA: NONE SEEN
Bilirubin Urine: NEGATIVE
GLUCOSE, UA: NEGATIVE mg/dL
Ketones, ur: NEGATIVE mg/dL
Leukocytes, UA: NEGATIVE
Nitrite: NEGATIVE
PH: 5 (ref 5.0–8.0)
PROTEIN: NEGATIVE mg/dL
SPECIFIC GRAVITY, URINE: 1.008 (ref 1.005–1.030)

## 2018-01-24 LAB — RAPID URINE DRUG SCREEN, HOSP PERFORMED
Amphetamines: NOT DETECTED
BARBITURATES: NOT DETECTED
BENZODIAZEPINES: NOT DETECTED
Cocaine: NOT DETECTED
Opiates: NOT DETECTED
TETRAHYDROCANNABINOL: NOT DETECTED

## 2018-01-24 LAB — TYPE AND SCREEN
ABO/RH(D): B POS
Antibody Screen: NEGATIVE

## 2018-01-24 LAB — I-STAT TROPONIN, ED: Troponin i, poc: 0.02 ng/mL (ref 0.00–0.08)

## 2018-01-24 LAB — GLUCOSE, CAPILLARY: Glucose-Capillary: 71 mg/dL (ref 70–99)

## 2018-01-24 LAB — BRAIN NATRIURETIC PEPTIDE: B Natriuretic Peptide: 189.2 pg/mL — ABNORMAL HIGH (ref 0.0–100.0)

## 2018-01-24 LAB — LIPASE, BLOOD: Lipase: 30 U/L (ref 11–51)

## 2018-01-24 MED ORDER — INSULIN ASPART 100 UNIT/ML ~~LOC~~ SOLN
0.0000 [IU] | Freq: Three times a day (TID) | SUBCUTANEOUS | Status: DC
  Administered 2018-01-25: 3 [IU] via SUBCUTANEOUS
  Administered 2018-01-25: 5 [IU] via SUBCUTANEOUS
  Administered 2018-01-26: 2 [IU] via SUBCUTANEOUS
  Administered 2018-01-26: 3 [IU] via SUBCUTANEOUS
  Administered 2018-01-26: 2 [IU] via SUBCUTANEOUS
  Administered 2018-01-27 (×2): 1 [IU] via SUBCUTANEOUS
  Administered 2018-01-28: 2 [IU] via SUBCUTANEOUS
  Administered 2018-01-28: 1 [IU] via SUBCUTANEOUS

## 2018-01-24 MED ORDER — METOPROLOL TARTRATE 25 MG PO TABS
25.0000 mg | ORAL_TABLET | Freq: Two times a day (BID) | ORAL | Status: DC
  Administered 2018-01-25 – 2018-01-28 (×7): 25 mg via ORAL
  Filled 2018-01-24 (×7): qty 1

## 2018-01-24 MED ORDER — PREGABALIN 75 MG PO CAPS
75.0000 mg | ORAL_CAPSULE | Freq: Two times a day (BID) | ORAL | Status: DC
  Administered 2018-01-24 – 2018-01-28 (×8): 75 mg via ORAL
  Filled 2018-01-24 (×8): qty 1

## 2018-01-24 MED ORDER — TAMSULOSIN HCL 0.4 MG PO CAPS
0.4000 mg | ORAL_CAPSULE | Freq: Every day | ORAL | Status: DC
  Administered 2018-01-25 – 2018-01-28 (×4): 0.4 mg via ORAL
  Filled 2018-01-24 (×4): qty 1

## 2018-01-24 MED ORDER — ESCITALOPRAM OXALATE 10 MG PO TABS
5.0000 mg | ORAL_TABLET | Freq: Every day | ORAL | Status: DC
  Administered 2018-01-25 – 2018-01-28 (×4): 5 mg via ORAL
  Filled 2018-01-24 (×4): qty 1

## 2018-01-24 MED ORDER — FUROSEMIDE 10 MG/ML IJ SOLN
40.0000 mg | Freq: Once | INTRAMUSCULAR | Status: AC
  Administered 2018-01-24: 40 mg via INTRAVENOUS
  Filled 2018-01-24: qty 4

## 2018-01-24 MED ORDER — RIVAROXABAN 20 MG PO TABS
20.0000 mg | ORAL_TABLET | Freq: Every day | ORAL | Status: DC
  Administered 2018-01-24 – 2018-01-27 (×4): 20 mg via ORAL
  Filled 2018-01-24 (×5): qty 1

## 2018-01-24 MED ORDER — INSULIN GLARGINE 100 UNIT/ML ~~LOC~~ SOLN
35.0000 [IU] | Freq: Every day | SUBCUTANEOUS | Status: DC
  Administered 2018-01-25: 35 [IU] via SUBCUTANEOUS
  Filled 2018-01-24: qty 0.35

## 2018-01-24 MED ORDER — ATORVASTATIN CALCIUM 40 MG PO TABS
40.0000 mg | ORAL_TABLET | Freq: Every day | ORAL | Status: DC
  Administered 2018-01-25 – 2018-01-27 (×3): 40 mg via ORAL
  Filled 2018-01-24 (×3): qty 1

## 2018-01-24 MED ORDER — INSULIN ASPART 100 UNIT/ML ~~LOC~~ SOLN
0.0000 [IU] | Freq: Every day | SUBCUTANEOUS | Status: DC
  Administered 2018-01-25 – 2018-01-26 (×2): 2 [IU] via SUBCUTANEOUS

## 2018-01-24 NOTE — ED Triage Notes (Signed)
Pt arrives from home with c/o shortness of breath weakness, AMS. FAmily reports swelling at abdomen. Hx of fluid around heart.

## 2018-01-24 NOTE — ED Provider Notes (Signed)
[facility] EMERGENCY DEPARTMENT Provider Note   CSN: [id number] Arrival date & time: 01/24/18  1622     History   Chief Complaint Chief Complaint  Patient presents with  . Shortness of Breath  . Weakness  . Altered Mental Status    HPI Charles Friedman is a 84 y.o. male.  84 year old male with prior medical history as detailed below presents for evaluation of gradually progressive generalized weakness.  Patient's history is primarily obtained from his caregiver who is at bedside.  The caregiver reports that the patient is normally able to ambulate on his own with minimal assistance.  Over the last week caregiver and family have noted significant decrease in the patient's ability to ambulate without significant assistance.  At baseline he is able to use a walker with minimal intervention.  Associated fever, chest pain, nausea, vomiting, bowel movement change, urinary symptoms are all denied by the caregiver.  Patient appears to be comfortable at time of my exam.  His complain,t upon my evaluation, is that he "needs to pee."  No reported fall.  Good PO intake reported by his caregiver.     The history is provided by the patient and medical records.  Weakness  Primary symptoms include no focal weakness, no loss of sensation. This is a new problem. The current episode started more than 1 week ago. The problem has not changed since onset.There was no focality noted. There has been no fever. Associated symptoms include shortness of breath. Pertinent negatives include no chest pain, no vomiting, no altered mental status and no headaches. Associated medical issues do not include trauma, mood changes or CVA.    Past Medical History:  Diagnosis Date  . Atrial fibrillation ([place]) 2003   Initially on Coumadin, now on Xarelto  . Cataract    Bilateral cataracts.  . Diabetes mellitus without complication ([place])    type II  . Diabetic peripheral neuropathy associated with type 2  diabetes mellitus ([place])   . Glaucoma    left eye  . Hx of radiation therapy 08/15/13- 10/06/13   prostate 7600 cGy 38 sessions  . Hyperlipidemia with target LDL less than 70   . Neuropathy    diabetic, in feet  . Personal history of fall 09/26/13   front entrance of [place]  . Prostate cancer [facility])    History of radiation therapy    Patient Active Problem List   Diagnosis Date Noted  . Renal insufficiency 10/20/2017  . Generalized weakness 10/20/2017  . Recurrent falls 10/20/2017  . Bilateral edema of lower extremity 04/10/2017  . Acute on chronic diastolic CHF (congestive heart failure), NYHA class 2 ([place]) 04/10/2017  . Hyperlipidemia with target LDL less than 100 01/22/2016  . Abnormality of gait 01/22/2016  . History of CVA (cerebrovascular accident) 11/26/2015  . TIA (transient ischemic attack) 11/25/2015  . Cerebral infarction ([place]) 11/25/2015  . Stroke (cerebrum) ([place]) 11/25/2015  . Type 2 diabetes mellitus with hyperglycemia ([place]) 07/15/2014  . UTI (lower urinary tract infection) 07/15/2014  . Fall at home 07/15/2014  . Closed rib fracture 07/15/2014  . Dehydration, mild 07/15/2014  . Diabetes mellitus without complication ([place])   . Hx of radiation therapy   . Persistent atrial fibrillation   . Prostate cancer ([place]) 01/29/2012    Past Surgical History:  Procedure Laterality Date  . CAROTID DOPPLERS  11/2015   Mild plaque bilaterally.  Less than 40%.  Antegrade vertebral flow.  Marland Kitchen PROSTATE BIOPSY  12/25/11  Adenocarcinoma, gleason 6  . TRANSTHORACIC ECHOCARDIOGRAM  11/2015   In setting of CVA: Technically difficult.  Normal LV size and function.  EF 55-60%.  No regional wall motion normality.  Moderate LA dilation, mild RA dilation.  Mildly increased PA pressures.  . TRANSTHORACIC ECHOCARDIOGRAM  10/20/2017   Low normal LV function.  EF 50 to 55%.  No R WMA.  Moderate LA dilation.  Mild to moderate RV dilation with mildly reduced function -however no significant  tricuspid regurgitation noted.  Mild R RA dilation.          Home Medications    Prior to Admission medications   Medication Sig Start Date End Date Taking? Authorizing Provider  acetaminophen (TYLENOL) 325 MG tablet Take 2 tablets (650 mg total) by mouth every 6 (six) hours as needed for mild pain or moderate pain. 10/23/17   Hongalgi, Lenis Dickinson, MD  atorvastatin (LIPITOR) 40 MG tablet Take 1 tablet (40 mg total) by mouth daily at 6 PM. 11/27/15   Eulogio Bear U, DO  escitalopram (LEXAPRO) 5 MG tablet Take 5 mg by mouth daily.    [provider]  furosemide (LASIX) 40 MG tablet Take 1 tablet (40 mg total) by mouth 2 (two) times daily. MAY TAKE AN EXTRA 40 MG IN THE MORNING IF WEIGHT IS ABOVE 3 LBS. 10/29/17   Leonie Man, MD  insulin glargine (LANTUS) 100 UNIT/ML injection Inject 0.35 mLs (35 Units total) into the skin daily. 10/24/17   Hongalgi, Lenis Dickinson, MD  insulin lispro (HUMALOG) 100 UNIT/ML injection Inject 0-0.09 mLs (0-9 Units total) into the skin 3 (three) times daily with meals. Correction coverage: Sensitive (thin, NPO, renal) CBG < 70: implement hypoglycemia protocol CBG 70 - 120: 0 units CBG 121 - 150: 1 unit CBG 151 - 200: 2 units CBG 201 - 250: 3 units CBG 251 - 300: 5 units CBG 301 - 350: 7 units CBG 351 - 400: 9 units CBG > 400: call MD. Patient taking differently: Inject 5 Units into the skin 3 (three) times daily with meals. Correction coverage: Sensitive (thin, NPO, renal) CBG < 70: implement hypoglycemia protocol CBG 70 - 120: 0 units CBG 121 - 150: 1 unit CBG 151 - 200: 2 units CBG 201 - 250: 3 units CBG 251 - 300: 5 units CBG 301 - 350: 7 units CBG 351 - 400: 9 units CBG > 400: call MD. 10/23/17   Modena Jansky, MD  LYRICA 75 MG capsule 75 mg 2 (two) times daily.  02/09/17   [provider]  metolazone (ZAROXOLYN) 2.5 MG tablet Take 1 tablet (2.5 mg total) by mouth every other day. 12/29/17   Leonie Man, MD  metoprolol tartrate  (LOPRESSOR) 25 MG tablet Take 1 tablet (25 mg total) by mouth 2 (two) times daily. 10/29/17 01/27/18  Leonie Man, MD  nitroGLYCERIN (NITROSTAT) 0.4 MG SL tablet Place 1 tablet (0.4 mg total) under the tongue every 5 (five) minutes as needed for chest pain. Patient not taking: Reported on 12/29/2017 01/14/17 10/20/17  Leonie Man, MD  rivaroxaban (XARELTO) 20 MG TABS tablet Take 1 tablet (20 mg total) by mouth daily with supper. 11/27/15   Geradine Girt, DO  tamsulosin (FLOMAX) 0.4 MG CAPS capsule Take 0.4 mg by mouth daily. 10/21/15   [provider]    Family History Family History  Problem Relation Age of Onset  . Cancer Sister 43       brain tumor died  at age 70  . Heart disease Mother        Marena Chancy of specifics  . Heart attack Father 48       sudden cardiac death    Social History Social History   Tobacco Use  . Smoking status: Never Smoker  . Smokeless tobacco: Never Used  Substance Use Topics  . Alcohol use: No  . Drug use: No     Allergies   Aureomycin [chlortetracycline]   Review of Systems Review of Systems  Respiratory: Positive for shortness of breath.   Cardiovascular: Negative for chest pain.  Gastrointestinal: Negative for vomiting.  Neurological: Positive for weakness. Negative for focal weakness and headaches.  All other systems reviewed and are negative.    Physical Exam Updated Vital Signs BP 123/64 (BP Location: Right Arm)   Pulse 72   Resp (!) 22   SpO2 93%   Physical Exam  Constitutional: He is oriented to person, place, and time. He appears well-developed and well-nourished. No distress.  HENT:  Head: Normocephalic and atraumatic.  Mouth/Throat: Oropharynx is clear and moist.  Eyes: Pupils are equal, round, and reactive to light. Conjunctivae and EOM are normal.  Neck: Normal range of motion. Neck supple.  Cardiovascular: Normal rate, regular rhythm and normal heart sounds.  Pulmonary/Chest: Effort normal and breath  sounds normal. No respiratory distress.  Abdominal: Soft. He exhibits no distension. There is no tenderness.  Musculoskeletal: Normal range of motion. He exhibits no edema or deformity.  Neurological: He is alert and oriented to person, place, and time.  Skin: Skin is warm and dry.  Psychiatric: He has a normal mood and affect.  Nursing note and vitals reviewed.    ED Treatments / Results  Labs (all labs ordered are listed, but only abnormal results are displayed) Labs Reviewed  URINALYSIS, ROUTINE W REFLEX MICROSCOPIC - Abnormal; Notable for the following components:      Result Value   Hgb urine dipstick MODERATE (*)    All other components within normal limits  COMPREHENSIVE METABOLIC PANEL - Abnormal; Notable for the following components:   Glucose, Bld 147 (*)    BUN 45 (*)    Calcium 8.8 (*)    Albumin 2.8 (*)    AST 139 (*)    ALT 100 (*)    All other components within normal limits  BRAIN NATRIURETIC PEPTIDE - Abnormal; Notable for the following components:   B Natriuretic Peptide 189.2 (*)    All other components within normal limits  CULTURE, BLOOD (ROUTINE X 2)  CULTURE, BLOOD (ROUTINE X 2)  URINE CULTURE  RAPID URINE DRUG SCREEN, HOSP PERFORMED  CBC WITH DIFFERENTIAL/PLATELET  LIPASE, BLOOD  PROTIME-INR  BASIC METABOLIC PANEL  MAGNESIUM  CBC  I-STAT TROPONIN, ED  I-STAT CG4 LACTIC ACID, ED  CBG MONITORING, ED  I-STAT CG4 LACTIC ACID, ED  TYPE AND SCREEN  ABO/RH    EKG EKG Interpretation  Date/Time:  Sunday January 24 2018 16:33:45 EST Ventricular Rate:  68 PR Interval:    QRS Duration: 109 QT Interval:  427 QTC Calculation: 455 R Axis:   71 Text Interpretation:  Atrial fibrillation Confirmed by Dene Gentry [phone]) on 01/24/2018 5:54:49 PM   Radiology Ct Head Wo Contrast  Result Date: 01/24/2018 CLINICAL DATA:  Altered mental status. EXAM: CT HEAD WITHOUT CONTRAST TECHNIQUE: Contiguous axial images were obtained from the base of the skull  through the vertex without intravenous contrast. COMPARISON:  10/20/2017 FINDINGS: Brain: There is no evidence for acute  hemorrhage, hydrocephalus, mass lesion, or abnormal extra-axial fluid collection. No definite CT evidence for acute infarction. Diffuse loss of parenchymal volume is consistent with atrophy. Patchy low attenuation in the deep hemispheric and periventricular white matter is nonspecific, but likely reflects chronic microvascular ischemic demyelination. Vascular: No hyperdense vessel or unexpected calcification. Skull: No evidence for fracture. No worrisome lytic or sclerotic lesion. Sinuses/Orbits: The visualized paranasal sinuses and mastoid air cells are clear. Visualized portions of the globes and intraorbital fat are unremarkable. Other: None. IMPRESSION: 1. Stable.  No acute intracranial abnormality. 2. Atrophy with chronic small vessel white matter ischemic disease. Electronically Signed   By: Misty Stanley M.D.   On: 01/24/2018 17:51   Dg Chest Port 1 View  Result Date: 01/24/2018 CLINICAL DATA:  Shortness of breath and weakness. Altered mental status. EXAM: PORTABLE CHEST 1 VIEW COMPARISON:  10/21/2017 FINDINGS: The heart is borderline enlarged but stable. There is tortuosity and calcification of the thoracic aorta. Chronic bronchitic type lung changes but no infiltrates or effusions. IMPRESSION: Mild stable cardiac enlargement but no acute pulmonary findings. Electronically Signed   By: Marijo Sanes M.D.   On: 01/24/2018 17:35    Procedures Procedures (including critical care time)  Medications Ordered in ED Medications - No data to display   Initial Impression / Assessment and Plan / ED Course  I have reviewed the triage vital signs and the nursing notes.  Pertinent labs & imaging results that were available during my care of the patient were reviewed by me and considered in my medical decision making (see chart for details).     MDM  Screen complete  Patient is  presenting for evaluation of progressive weakness and shortness of breath.  Weakness appears to have been worsening over the last 7 days.  Shortness of breath appears to be somewhat intermittent.  Patient appears to be comfortable - but globally weak - at the time of my initial exam.  Screening labs are on the whole without significant abnormality.  Patient would likely benefit from inpatient work-up and evaluation.  Hospitalist service is aware of case and will evaluate for admission.  Final Clinical Impressions(s) / ED Diagnoses   Final diagnoses:  Shortness of breath  Weakness    ED Discharge Orders    None       Valarie Merino, MD 01/24/18 2131

## 2018-01-24 NOTE — H&P (Signed)
History and Physical  Charles Friedman ULA:[id number] DOB: 08-10-1932 DOA: 01/24/2018 1622  Referring physician: Demaris Callander [facility] ED) PCP: Haywood Pao, MD   HISTORY   Chief Complaint: dyspnea on exertion and fatigue  HPI: Charles Friedman is a 84 y.o. male with hx of afib (on xarelto), hx of CVA, DMT2, peripheral neuropathy, hx of prostate CA, and diastolic CHF with borderline EF 50-55%. Was admitted for ADHF in September with similar sx and had clinically improved with diuresis. Since then, patient has been able to ambulate and has been nearly independent with his ADLs. About 2-3 weeks ago, family reports that PCP had stopped metolazone but continued lasix due to concern for overdiuresis. Family has not noticed significant lower extremity but reports patient has been becoming increasingly dyspneic and fatigued with minimal exertion. Last weight about a week ago was around 235 lbs (not significantly changed from Nov but his dry weight from Sep was 218 lbs). Have not been able to weight him daily due to leg weakness. Patient reports that he feels short of breath. PCP resumed metolazone last evening/day prior to admission, and patient reports marked increase in urine output today. Home PT eval also noted O2 sat 92-93% (which is lower than baseline). Thus, given patient's persistent dyspnea and weakness, family opted to bring to [facility] ED.   Review of Systems:  + dyspnea, fatigue on exertion; +orthopnea, PND - family denies edema (see below exam) - no cough - no chest pain - no nausea/vomiting; no tarry, melanotic or bloody stools - no dysuria or increased urinary frequency  Rest of systems reviewed are negative, except as per above history.   ED course:  Vitals Blood pressure (!) 110/91, pulse 69, temperature 97.9 F (36.6 C), temperature source Oral, resp. rate 20, SpO2 93 %. No ED medications. CT head and CXR summarized below.   Past Medical History:  Diagnosis Date  . Atrial fibrillation ([place])  2003   Initially on Coumadin, now on Xarelto  . Cataract    Bilateral cataracts.  . Diabetes mellitus without complication ([place] )    type II  . Diabetic peripheral neuropathy associated with type 2 diabetes mellitus ([place])   . Glaucoma    left eye  . Hx of radiation therapy 08/15/13- 10/06/13   prostate 7600 cGy 38 sessions  . Hyperlipidemia with target LDL less than 70   . Neuropathy    diabetic, in feet  . Personal history of fall 09/26/13   front entrance of [place]  . Prostate cancer [facility])    History of radiation therapy   Past Surgical History:  Procedure Laterality Date  . CAROTID DOPPLERS  11/2015   Mild plaque bilaterally.  Less than 40%.  Antegrade vertebral flow.  Marland Kitchen PROSTATE BIOPSY  12/25/11   Adenocarcinoma, gleason 6  . TRANSTHORACIC ECHOCARDIOGRAM  11/2015   In setting of CVA: Technically difficult.  Normal LV size and function.  EF 55-60%.  No regional wall motion normality.  Moderate LA dilation, mild RA dilation.  Mildly increased PA pressures.  . TRANSTHORACIC ECHOCARDIOGRAM  10/20/2017   Low normal LV function.  EF 50 to 55%.  No R WMA.  Moderate LA dilation.  Mild to moderate RV dilation with mildly reduced function -however no significant tricuspid regurgitation noted.  Mild R RA dilation.      Social History:  reports that he has never smoked. He has never used smokeless tobacco. He reports that he does not drink alcohol or use drugs.  Allergies  Allergen Reactions  . Aureomycin [Chlortetracycline] Itching and Rash    Reaction approximately 1956    Family History  Problem Relation Age of Onset  . Cancer Sister 69       brain tumor died at age 54  . Heart disease Mother        Marena Chancy of specifics  . Heart attack Father 70       sudden cardiac death      Prior to Admission medications   Medication Sig Start Date End Date Taking? Authorizing Provider  atorvastatin (LIPITOR) 40 MG tablet Take 1 tablet (40 mg total) by mouth daily at 6 PM. Patient taking  differently: Take 40 mg by mouth daily after supper.  11/27/15  Yes Vann, Jessica U, DO  escitalopram (LEXAPRO) 5 MG tablet Take 5 mg by mouth daily.   Yes [provider]  furosemide (LASIX) 40 MG tablet Take 1 tablet (40 mg total) by mouth 2 (two) times daily. MAY TAKE AN EXTRA 40 MG IN THE MORNING IF WEIGHT IS ABOVE 3 LBS. Patient taking differently: Take 40 mg by mouth See admin instructions. Take one tablet (40 mg) by mouth twice daily, may take an extra 40 mg in the morning for weight gain >3 lbs over night 10/29/17  Yes Leonie Man, MD  insulin glargine (LANTUS) 100 unit/mL SOPN Inject 35 Units into the skin daily before breakfast.   Yes [provider]  insulin lispro (HUMALOG KWIKPEN) 100 UNIT/ML KwikPen Inject 5 Units into the skin 3 (three) times daily before meals.    Yes [provider]  metolazone (ZAROXOLYN) 2.5 MG tablet Take 1 tablet (2.5 mg total) by mouth every other day. Patient taking differently: Take 2.5 mg by mouth once.  12/29/17  Yes Leonie Man, MD  metoprolol tartrate (LOPRESSOR) 25 MG tablet Take 1 tablet (25 mg total) by mouth 2 (two) times daily. 10/29/17 01/27/18 Yes Leonie Man, MD  nitroGLYCERIN (NITROSTAT) 0.4 MG SL tablet Place 1 tablet (0.4 mg total) under the tongue every 5 (five) minutes as needed for chest pain. 01/14/17 02/24/18 Yes Leonie Man, MD  pregabalin (LYRICA) 75 MG capsule Take 75 mg by mouth 2 (two) times daily.   Yes [provider]  rivaroxaban (XARELTO) 20 MG TABS tablet Take 1 tablet (20 mg total) by mouth daily with supper. 11/27/15  Yes Eulogio Bear U, DO  tamsulosin (FLOMAX) 0.4 MG CAPS capsule Take 0.4 mg by mouth daily. 10/21/15  Yes [provider]  acetaminophen (TYLENOL) 325 MG tablet Take 2 tablets (650 mg total) by mouth every 6 (six) hours as needed for mild pain or moderate pain. Patient not taking: Reported on 01/24/2018 10/23/17   Modena Jansky, MD  insulin glargine  (LANTUS) 100 UNIT/ML injection Inject 0.35 mLs (35 Units total) into the skin daily. Patient not taking: Reported on 01/24/2018 10/24/17   Modena Jansky, MD  insulin lispro (HUMALOG) 100 UNIT/ML injection Inject 0-0.09 mLs (0-9 Units total) into the skin 3 (three) times daily with meals. Correction coverage: Sensitive (thin, NPO, renal) CBG < 70: implement hypoglycemia protocol CBG 70 - 120: 0 units CBG 121 - 150: 1 unit CBG 151 - 200: 2 units CBG 201 - 250: 3 units CBG 251 - 300: 5 units CBG 301 - 350: 7 units CBG 351 - 400: 9 units CBG > 400: call MD. Patient not taking: Reported on 01/24/2018 10/23/17   Modena Jansky, MD  PHYSICAL EXAM   Temp:  [97.9 F (36.6 C)] 97.9 F (36.6 C) (12/08 1913) Pulse Rate:  [51-108] 69 (12/08 2145) Cardiac Rhythm: Atrial fibrillation (12/08 1753) Resp:  [16-22] 20 (12/08 2145) BP: (97-127)/(59-91) 110/91 (12/08 2145) SpO2:  [91 %-99 %] 93 % (12/08 2145)  BP (!) 110/91   Pulse 69   Temp 97.9 F (36.6 C) (Oral)   Resp 20   SpO2 93%    GEN obese elderly caucasian male; resting in bed, appears comfortable  HEENT NCAT EOM intact PERRL; clear oropharynx, no cervical LAD; moist mucus membranes  JVP estimated +10 cm H2O above RA; +HJR ; no carotid bruits b/l ;  CV regular normal rate; normal S1 and S2; no m/r/g or S3/S4; PMI non displaced; no parasternal heave  RESP  Diminished at bases; breathing unlabored and symmetric  ABD soft NT ND +normoactive BS  EXT warm throughout b/l; 2+ peripheral edema at upper thighs (gravity dependent) and 1+ in lower shins PULSES  DP and radials 2+   SKIN/MSK chronic venous stasis changes b/l NEURO/PSYCH AAOx4; no focal deficits   DATA   LABS ON ADMISSION:  Basic Metabolic Panel: Recent Labs  Lab 01/24/18 1712  NA 139  K 3.8  CL 98  CO2 28  GLUCOSE 147*  BUN 45*  CREATININE 1.08  CALCIUM 8.8*   CBC: Recent Labs  Lab 01/24/18 1712  WBC 8.9  NEUTROABS 5.9  HGB 14.1  HCT 44.0  MCV 90.5    PLT 266   Liver Function Tests: Recent Labs  Lab 01/24/18 1712  AST 139*  ALT 100*  ALKPHOS 123  BILITOT 1.0  PROT 6.9  ALBUMIN 2.8*   Recent Labs  Lab 01/24/18 1712  LIPASE 30   No results for input(s): AMMONIA in the last 168 hours. Coagulation:  Lab Results  Component Value Date   INR 1.18 11/26/2015   INR 1.17 11/25/2015   INR 1.63 (H) 07/18/2014   No results found for: PTT Lactic Acid, Venous:     Component Value Date/Time   LATICACIDVEN 1.54 01/24/2018 1734   Cardiac Enzymes: No results for input(s): CKTOTAL, CKMB, CKMBINDEX, TROPONINI in the last 168 hours. Urinalysis:    Component Value Date/Time   COLORURINE YELLOW 01/24/2018 1712   APPEARANCEUR CLEAR 01/24/2018 1712   LABSPEC 1.008 01/24/2018 1712   PHURINE 5.0 01/24/2018 1712   GLUCOSEU NEGATIVE 01/24/2018 1712   HGBUR MODERATE (A) 01/24/2018 1712   BILIRUBINUR NEGATIVE 01/24/2018 1712   KETONESUR NEGATIVE 01/24/2018 1712   PROTEINUR NEGATIVE 01/24/2018 1712   UROBILINOGEN 0.2 07/15/2014 2038   NITRITE NEGATIVE 01/24/2018 1712   LEUKOCYTESUR NEGATIVE 01/24/2018 1712    BNP (last 3 results) No results for input(s): PROBNP in the last 8760 hours. CBG: No results for input(s): GLUCAP in the last 168 hours.  Radiological Exams on Admission: Ct Head Wo Contrast  Result Date: 01/24/2018 CLINICAL DATA:  Altered mental status. EXAM: CT HEAD WITHOUT CONTRAST TECHNIQUE: Contiguous axial images were obtained from the base of the skull through the vertex without intravenous contrast. COMPARISON:  10/20/2017 FINDINGS: Brain: There is no evidence for acute hemorrhage, hydrocephalus, mass lesion, or abnormal extra-axial fluid collection. No definite CT evidence for acute infarction. Diffuse loss of parenchymal volume is consistent with atrophy. Patchy low attenuation in the deep hemispheric and periventricular white matter is nonspecific, but likely reflects chronic microvascular ischemic demyelination.  Vascular: No hyperdense vessel or unexpected calcification. Skull: No evidence for fracture. No worrisome lytic or sclerotic lesion.  Sinuses/Orbits: The visualized paranasal sinuses and mastoid air cells are clear. Visualized portions of the globes and intraorbital fat are unremarkable. Other: None. IMPRESSION: 1. Stable.  No acute intracranial abnormality. 2. Atrophy with chronic small vessel white matter ischemic disease. Electronically Signed   By: Misty Stanley M.D.   On: 01/24/2018 17:51   Dg Chest Port 1 View  Result Date: 01/24/2018 CLINICAL DATA:  Shortness of breath and weakness. Altered mental status. EXAM: PORTABLE CHEST 1 VIEW COMPARISON:  10/21/2017 FINDINGS: The heart is borderline enlarged but stable. There is tortuosity and calcification of the thoracic aorta. Chronic bronchitic type lung changes but no infiltrates or effusions. IMPRESSION: Mild stable cardiac enlargement but no acute pulmonary findings. Electronically Signed   By: Marijo Sanes M.D.   On: 01/24/2018 17:35    EKG: Independently reviewed. afib with vent rate 68 bpm; normal ST baseline; low voltage   ASSESSMENT AND PLAN   Assessment: Charles Friedman is a 84 y.o. male with hx of afib (on xarelto), DMT2, peripheral neuropathy, hx of prostate CA, and diastolic CHF with borderline EF 50-55%. Was admitted for ADHF in September with similar sx and had clinically improved with diuresis. Appears volume overloaded again with mild hypoxia, although suspect that deconditioning is also playing a role in limiting his mobility. May be related to under-diuresing since metolazone was discontinued for few weeks prior sx. True dry weight is unclear, although his new reported baseline dry weight of 235 lbs this year is still higher than nadir of 218 lbs from when he was discharged in September. BNP is 189 (down from 268 from Sep admission); CXR shows cephalization but no frank volume overload. However, his exam reveals pitting edema in upper  thighs and lower back (gravity-dependent areas) that is more pronounced compared to edema in his lower legs/shins.   Active Problems:   Acute decompensated heart failure ([place])  Plan:   # Acute decompensated heart failure with known diastolic HFpEF > unclear dry weight, suspect should be closer to 220 lbs - lasix 40mg  IV x 1 overnight - if not meeting goal, will redose metolazone (was on 2.5mg  prn daily dose) - likely needs metolazone on dishcarge - high goal K and Mag repletion - goal net negative 2L per day - strict ins/outs; daily weights - fluid and dietary sodium restriction - holding ace-inhibitor in setting of active diuresis - b-blocker: continue home metoprolol tartrate 25mg  BID - admit to telemetry floor  # Peripheral neuropathy and deconditioning - resume pregalain - PT/OT eval - resume home lexapro  # Chronic afib on metop and xarelto. Prior hx of CVA > HR in 60-110s - continue home metoprolol  - resume home xarelto - telemetry  # Chronic IDDM - resume home lantus 35 units daily - low SSI AC with fingersticks ACHS  # Hx of prostate CA s/p XRT and obstruction  - resume flomax     DVT Prophylaxis: on xarelto Code Status:  Full Code Family Communication: patient and family (son) at bedside)  Disposition Plan: admit to telemetry for ADHF, dispo pending PT/OT and clinical improvement  Patient contact: Extended Emergency Contact Information Primary Emergency Contact: Uphoff Jr,Clearnce Address: [address], [place] [address] [place] Phone: [phone] Mobile Phone: [phone] Relation: Son Secondary Emergency Contact: [place] Phone: [phone] Relation: Friend  Time spent: > 35 mins  Colbert Ewing, MD [facility] Pager [phone]  If 7PM-7AM, please contact night-coverage [email or web address] Password [email or web address] 01/24/2018, 10:09 PM

## 2018-01-24 NOTE — ED Notes (Signed)
Dr. Francia Greaves states to not repeat lactic.

## 2018-01-25 DIAGNOSIS — I4819 Other persistent atrial fibrillation: Secondary | ICD-10-CM

## 2018-01-25 DIAGNOSIS — R531 Weakness: Secondary | ICD-10-CM

## 2018-01-25 DIAGNOSIS — I5033 Acute on chronic diastolic (congestive) heart failure: Principal | ICD-10-CM

## 2018-01-25 LAB — CBC
HEMATOCRIT: 42.1 % (ref 39.0–52.0)
Hemoglobin: 13.4 g/dL (ref 13.0–17.0)
MCH: 28.5 pg (ref 26.0–34.0)
MCHC: 31.8 g/dL (ref 30.0–36.0)
MCV: 89.6 fL (ref 80.0–100.0)
Platelets: 289 10*3/uL (ref 150–400)
RBC: 4.7 MIL/uL (ref 4.22–5.81)
RDW: 13.3 % (ref 11.5–15.5)
WBC: 9.6 10*3/uL (ref 4.0–10.5)
nRBC: 0 % (ref 0.0–0.2)

## 2018-01-25 LAB — BASIC METABOLIC PANEL
Anion gap: 11 (ref 5–15)
BUN: 44 mg/dL — ABNORMAL HIGH (ref 8–23)
CALCIUM: 8.7 mg/dL — AB (ref 8.9–10.3)
CO2: 33 mmol/L — ABNORMAL HIGH (ref 22–32)
Chloride: 97 mmol/L — ABNORMAL LOW (ref 98–111)
Creatinine, Ser: 1.25 mg/dL — ABNORMAL HIGH (ref 0.61–1.24)
GFR calc Af Amer: >60 mL/min (ref >60)
GFR calc non Af Amer: 53 mL/min — ABNORMAL LOW (ref >60)
Glucose, Bld: 65 mg/dL — ABNORMAL LOW (ref 70–99)
Potassium: 3.2 mmol/L — ABNORMAL LOW (ref 3.5–5.1)
Sodium: 141 mmol/L (ref 135–145)

## 2018-01-25 LAB — URINE CULTURE: Culture: NO GROWTH

## 2018-01-25 LAB — GLUCOSE, CAPILLARY
Glucose-Capillary: 218 mg/dL — ABNORMAL HIGH (ref 70–99)
Glucose-Capillary: 272 mg/dL — ABNORMAL HIGH (ref 70–99)
Glucose-Capillary: 243 mg/dL — ABNORMAL HIGH (ref 70–99)

## 2018-01-25 LAB — PROTIME-INR
INR: 1.32
Prothrombin Time: 16.2 seconds — ABNORMAL HIGH (ref 11.4–15.2)

## 2018-01-25 LAB — ABO/RH: ABO/RH(D): B POS

## 2018-01-25 LAB — MAGNESIUM: Magnesium: 1.8 mg/dL (ref 1.7–2.4)

## 2018-01-25 MED ORDER — POTASSIUM CHLORIDE CRYS ER 20 MEQ PO TBCR
40.0000 meq | EXTENDED_RELEASE_TABLET | Freq: Once | ORAL | Status: AC
  Administered 2018-01-25: 40 meq via ORAL
  Filled 2018-01-25: qty 2

## 2018-01-25 MED ORDER — INSULIN GLARGINE 100 UNIT/ML ~~LOC~~ SOLN
28.0000 [IU] | Freq: Every day | SUBCUTANEOUS | Status: DC
  Administered 2018-01-26 – 2018-01-28 (×3): 28 [IU] via SUBCUTANEOUS
  Filled 2018-01-25 (×3): qty 0.28

## 2018-01-25 MED ORDER — INSULIN ASPART 100 UNIT/ML ~~LOC~~ SOLN
3.0000 [IU] | Freq: Three times a day (TID) | SUBCUTANEOUS | Status: DC
  Administered 2018-01-25 – 2018-01-28 (×9): 3 [IU] via SUBCUTANEOUS

## 2018-01-25 NOTE — Evaluation (Signed)
Physical Therapy Evaluation Patient Details Name: Charles Friedman MRN: [id number] DOB: [birth year] Today's Date: 01/25/2018   History of Present Illness  Patient is a 84 y/o male who presents with SOB and weakness. Found to have acute decompensated CHF. Admitted in Sept for similar symptoms. PMH includes A-fib, CVA, bil cataracts, DM, glaucoma, diabetic neuropathy, prostate CA.  Clinical Impression  Patient presents with generalized weakness, dyspnea on exertion, impaired activity tolerance and impaired balance/mobility s/p above. Required heavy Mod A for bed mobility and standing transfer and manual assist for taking a few steps to get to chair navigating RW. Pt with bil knee instability and attempted to sit prematurely. Sp02 remained >90% on RA but demonstrated 2-3/4 DOE. Questionable historian and no family members present to provide PLOF. Pt reports he has a caregiver come in for a few hrs in AM and then again in PM, reports minimal ambulation with RW and uses w/c for mobility. If pt not able to get 24/7 physically assist, may require SNF to maximize independence and mobility prior to return home. Will follow acutely.     Follow Up Recommendations SNF;Supervision for mobility/OOB    Equipment Recommendations  None recommended by PT    Recommendations for Other Services       Precautions / Restrictions Precautions Precautions: Fall Restrictions Weight Bearing Restrictions: No      Mobility  Bed Mobility Overal bed mobility: Needs Assistance Bed Mobility: Supine to Sit     Supine to sit: Mod assist;HOB elevated     General bed mobility comments: Heavy Mod A to elevate trunk to get to EOB; assist with hips as well. Pt reaching for rail for support.  Transfers Overall transfer level: Needs assistance Equipment used: Rolling walker (2 wheeled) Transfers: Sit to/from Stand Sit to Stand: Mod assist;From elevated surface         General transfer comment: Heavy Mod A to rise from  EOB with cues for hand placement and anterior translation.   Ambulation/Gait Ambulation/Gait assistance: Min assist Gait Distance (Feet): 3 Feet Assistive device: Rolling walker (2 wheeled) Gait Pattern/deviations: Step-to pattern;Trunk flexed     General Gait Details: Able to take a few steps to get to chair with min A for balance and to navigate RW; attempting to sit prematurely. Bil knee instability. Sp02 >90% on RA. 2-3/4 DOE.  Stairs            Wheelchair Mobility    Modified Rankin (Stroke Patients Only)       Balance Overall balance assessment: Needs assistance;History of Falls Sitting-balance support: Feet supported;Single extremity supported Sitting balance-Leahy Scale: Poor Sitting balance - Comments: Requires UE support for sitting balance, posterior lean during dynamic tasks. Postural control: Posterior lean Standing balance support: During functional activity;Bilateral upper extremity supported Standing balance-Leahy Scale: Poor Standing balance comment: Reliant on BUEs for support in standing.                             Pertinent Vitals/Pain Pain Assessment: No/denies pain    Home Living Family/patient expects to be discharged to:: Private residence Living Arrangements: Other (Comment)(care giver) Available Help at Discharge: Personal care attendant;Available PRN/intermittently;Family Type of Home: House Home Access: Ramped entrance       Home Equipment: [place] - 2 wheels;Shower seat;Bedside commode;Grab bars - tub/shower;Grab bars - toilet;Wheelchair - manual      Prior Function Level of Independence: Needs assistance   Gait / Transfers Assistance Needed: Minimal walking  with RW, uses w/c for mobility  ADL's / Homemaking Assistance Needed: Assists with ADLs- LB dressing  Comments: patient unreliable historian, family not present to elaborate/provide further details      Hand Dominance        Extremity/Trunk Assessment    Upper Extremity Assessment Upper Extremity Assessment: Defer to OT evaluation    Lower Extremity Assessment Lower Extremity Assessment: Generalized weakness    Cervical / Trunk Assessment Cervical / Trunk Assessment: Kyphotic  Communication   Communication: No difficulties  Cognition Arousal/Alertness: Awake/alert Behavior During Therapy: WFL for tasks assessed/performed Overall Cognitive Status: Impaired/Different from baseline Area of Impairment: Orientation;Memory;Following commands;Problem solving                 Orientation Level: Time   Memory: Decreased short-term memory Following Commands: Follows one step commands with increased time(increased time)     Problem Solving: Slow processing;Decreased initiation;Difficulty sequencing;Requires verbal cues;Requires tactile cues        General Comments      Exercises     Assessment/Plan    PT Assessment Patient needs continued PT services  PT Problem List Decreased strength;Decreased mobility;Decreased balance;Decreased activity tolerance;Decreased cognition;Cardiopulmonary status limiting activity       PT Treatment Interventions Functional mobility training;Balance training;Gait training;Therapeutic activities;Therapeutic exercise;Patient/family education;Wheelchair mobility training    PT Goals (Current goals can be found in the Care Plan section)  Acute Rehab PT Goals Patient Stated Goal: to feel better PT Goal Formulation: With patient Time For Goal Achievement: 02/08/18 Potential to Achieve Goals: Good    Frequency Min 3X/week   Barriers to discharge Decreased caregiver support home alone during the day    Co-evaluation               AM-PAC PT "6 Clicks" Mobility  Outcome Measure Help needed turning from your back to your side while in a flat bed without using bedrails?: A Lot Help needed moving from lying on your back to sitting on the side of a flat bed without using bedrails?: A  Lot Help needed moving to and from a bed to a chair (including a wheelchair)?: A Lot Help needed standing up from a chair using your arms (e.g., wheelchair or bedside chair)?: A Lot Help needed to walk in hospital room?: A Little Help needed climbing 3-5 steps with a railing? : Total 6 Click Score: 12    End of Session Equipment Utilized During Treatment: Gait belt Activity Tolerance: Patient limited by fatigue Patient left: in chair;with call bell/phone within reach;with chair alarm set Nurse Communication: Mobility status PT Visit Diagnosis: Muscle weakness (generalized) (M62.81);Difficulty in walking, not elsewhere classified (R26.2)    Time: 3254-9826 PT Time Calculation (min) (ACUTE ONLY): 25 min   Charges:   PT Evaluation $PT Eval Moderate Complexity: 1 Mod PT Treatments $Therapeutic Activity: 8-22 mins        Wray Kearns, PT, DPT Acute Rehabilitation Services Pager [phone] Office [phone]      Charles Friedman 01/25/2018, 9:20 AM

## 2018-01-25 NOTE — Discharge Instructions (Addendum)
Shortness of Breath, Adult Shortness of breath means you have trouble breathing. Your lungs are organs for breathing. Follow these instructions at home: Pay attention to any changes in your symptoms. Take these actions to help with your condition:  Do not smoke. Smoking can cause shortness of breath. If you need help to quit smoking, ask your doctor.  Avoid things that can make it harder to breathe, such as: ? Mold. ? Dust. ? Air pollution. ? Chemical smells. ? Things that can cause allergy symptoms (allergens), if you have allergies.  Keep your living space clean and free of mold and dust.  Rest as needed. Slowly return to your usual activities.  Take over-the-counter and prescription medicines, including oxygen and inhaled medicines, only as told by your doctor.  Keep all follow-up visits as told by your doctor. This is important.  Contact a doctor if:  Your condition does not get better as soon as expected.  You have a hard time doing your normal activities, even after you rest.  You have new symptoms. Get help right away if:  You have trouble breathing when you are resting.  You feel light-headed or you faint.  You have a cough that is not helped by medicines.  You cough up blood.  You have pain with breathing.  You have pain in your chest, arms, shoulders, or belly (abdomen).  You have a fever.  You cannot walk up stairs.  You cannot exercise the way you normally do. This information is not intended to replace advice given to you by your health care provider. Make sure you discuss any questions you have with your health care provider. Document Released: 07/23/2007 Document Revised: 02/21/2016 Document Reviewed: 02/21/2016 Elsevier Interactive Patient Education  2017 Elsevier Inc.   Weakness Weakness is a lack of strength. You may feel weak all over your body (generalized), or you may feel weak in one specific part of your body (focal). There are many  potential causes of weakness. Sometimes, the cause of your weakness may not be known. Some causes of weakness can be serious, so it is important to see your doctor. Follow these instructions at home:  Rest as needed.  Try to get enough sleep. Talk to your doctor about how much sleep you need each night.  Take over-the-counter and prescription medicines only as told by your doctor.  Eat a healthy, well-balanced diet. This includes: ? Proteins to build muscles, such as lean meats and fish. ? Fresh fruits and vegetables. ? Carbohydrates to boost energy, such as whole grains.  Drink enough fluid to keep your pee (urine) clear or pale yellow.  Do strength exercises, such as arm curls and leg raises, for 30 minutes at least 2 days a week or as told by your doctor.  Think about working with a physical therapist or trainer to help you get stronger.  Keep all follow-up visits as told by your doctor. This is important. Contact a doctor if:  Your weakness does not get better or it gets worse.  Your weakness affects your ability to: ? Think clearly. ? Do your normal daily activities. Get help right away if:  You have sudden weakness.  You have trouble breathing or shortness of breath.  You have problems with your vision.  You have trouble talking or swallowing.  You have trouble standing or walking.  You have chest pain.  You are light-headed.  You pass out (lose consciousness). This information is not intended to replace advice given  to you by your health care provider. Make sure you discuss any questions you have with your health care provider. Document Released: 01/17/2008 Document Revised: 03/01/2015 Document Reviewed: 11/24/2014 Elsevier Interactive Patient Education  2018 [place] on my medicine - XARELTO (Rivaroxaban)  This medication education was reviewed with me or my healthcare representative as part of my discharge preparation.  Why was Xarelto  prescribed for you? Xarelto was prescribed for you to reduce the risk of a blood clot forming that can cause a stroke if you have a medical condition called atrial fibrillation (a type of irregular heartbeat).  What do you need to know about xarelto ? Take your Xarelto ONCE DAILY at the same time every day with your evening meal. If you have difficulty swallowing the tablet whole, you may crush it and mix in applesauce just prior to taking your dose.  Take Xarelto exactly as prescribed by your doctor and DO NOT stop taking Xarelto without talking to the doctor who prescribed the medication.  Stopping without other stroke prevention medication to take the place of Xarelto may increase your risk of developing a clot that causes a stroke.  Refill your prescription before you run out.  After discharge, you should have regular check-up appointments with your healthcare provider that is prescribing your Xarelto.  In the future your dose may need to be changed if your kidney function or weight changes by a significant amount.  What do you do if you miss a dose? If you are taking Xarelto ONCE DAILY and you miss a dose, take it as soon as you remember on the same day then continue your regularly scheduled once daily regimen the next day. Do not take two doses of Xarelto at the same time or on the same day.   Important Safety Information A possible side effect of Xarelto is bleeding. You should call your healthcare provider right away if you experience any of the following: ? Bleeding from an injury or your nose that does not stop. ? Unusual colored urine (red or dark brown) or unusual colored stools (red or black). ? Unusual bruising for unknown reasons. ? A serious fall or if you hit your head (even if there is no bleeding).  Some medicines may interact with Xarelto and might increase your risk of bleeding while on Xarelto. To help avoid this, consult your healthcare provider or pharmacist  prior to using any new prescription or non-prescription medications, including herbals, vitamins, non-steroidal anti-inflammatory drugs (NSAIDs) and supplements.  This website has more information on Xarelto: [email or web address]

## 2018-01-25 NOTE — NC FL2 (Signed)
[place] MEDICAID FL2 LEVEL OF CARE SCREENING TOOL     IDENTIFICATION  Patient Name: Charles Friedman Birthdate: 09-09-1932 Sex: male Admission Date (Current Location): 01/24/2018  [facility] and [place] Number:  Herbalist and Address:  [facility] [place] [place] [address] [place], [place] [address]      Provider Number: [id number]  Attending Physician Name and Address:  Donne Hazel, MD  Relative Name and Phone Number:  Ron[place], [place], [phone]    Current Level of Care: Hospital Recommended Level of Care: [place] Prior Approval Number:    Date Approved/Denied: 10/22/17 PASRR Number: [id number]  Discharge Plan: SNF    Current Diagnoses: Patient Active Problem List   Diagnosis Date Noted  . Acute decompensated heart failure ([place]) 01/24/2018  . Renal insufficiency 10/20/2017  . Generalized weakness 10/20/2017  . Recurrent falls 10/20/2017  . Bilateral edema of lower extremity 04/10/2017  . Acute on chronic diastolic CHF (congestive heart failure), NYHA class 2 ([place]) 04/10/2017  . Hyperlipidemia with target LDL less than 100 01/22/2016  . Abnormality of gait 01/22/2016  . History of CVA (cerebrovascular accident) 11/26/2015  . TIA (transient ischemic attack) 11/25/2015  . Cerebral infarction ([place]) 11/25/2015  . Stroke (cerebrum) ([place]) 11/25/2015  . Type 2 diabetes mellitus with hyperglycemia ([place]) 07/15/2014  . UTI (lower urinary tract infection) 07/15/2014  . Fall at home 07/15/2014  . Closed rib fracture 07/15/2014  . Dehydration, mild 07/15/2014  . Diabetes mellitus without complication ([place])   . Hx of radiation therapy   . Persistent atrial fibrillation   . Prostate cancer ([place]) 01/29/2012    Orientation RESPIRATION BLADDER Height & Weight     Self, Place  O2(Sp02 93, nasal cannula, flow rate 3) Incontinent Weight: 240 lb 4.8 oz (109 kg) Height:  6\' 1"  (185.4 cm)  BEHAVIORAL SYMPTOMS/MOOD NEUROLOGICAL BOWEL  NUTRITION STATUS      Continent Diet(cardiac/heart healthy)  AMBULATORY STATUS COMMUNICATION OF NEEDS Skin   Limited Assist Verbally Normal(Has damage skin on left buttocks/groin area)                       Personal Care Assistance Level of Assistance  Bathing, Feeding, Dressing, Total care Bathing Assistance: Limited assistance Feeding assistance: Independent Dressing Assistance: Limited assistance Total Care Assistance: Limited assistance   Functional Limitations Info  Sight, Hearing, Speech Sight Info: Adequate Hearing Info: Adequate Speech Info: Adequate    SPECIAL CARE FACTORS FREQUENCY  PT (By licensed PT), OT (By licensed OT)     PT Frequency: 5x/wk OT Frequency: 5x/wk            Contractures Contractures Info: Not present    Additional Factors Info  Code Status, Allergies, Insulin Sliding Scale Code Status Info: Full Code Allergies Info: Aureomycin Chlortetracycline   Insulin Sliding Scale Info: insulin aspart (novoLOG) injection 0-5 Units at bedtime; insulin aspart (novoLOG) injection 0-9 Units 3x daily w/meals;  insulin aspart (novoLOG) injection 3 Units 3x with meals;  insulin glargine (LANTUS) injection 28 Units daily before breakfast       Current Medications (01/25/2018):  This is the current hospital active medication list Current Facility-Administered Medications  Medication Dose Route Frequency Provider Last Rate Last Dose  . atorvastatin (LIPITOR) tablet 40 mg  40 mg Oral QPC supper Park, Derenda Mis, MD      . escitalopram (LEXAPRO) tablet 5 mg  5 mg Oral Daily Park, Derenda Mis, MD   5  mg at 01/25/18 0910  . insulin aspart (novoLOG) injection 0-5 Units  0-5 Units Subcutaneous QHS Park, Derenda Mis, MD      . insulin aspart (novoLOG) injection 0-9 Units  0-9 Units Subcutaneous TID WC Park, Derenda Mis, MD   3 Units at 01/25/18 1247  . insulin aspart (novoLOG) injection 3 Units  3 Units Subcutaneous TID WC Donne Hazel, MD      . Derrill Memo ON 01/26/2018]  insulin glargine (LANTUS) injection 28 Units  28 Units Subcutaneous QAC breakfast Donne Hazel, MD      . metoprolol tartrate (LOPRESSOR) tablet 25 mg  25 mg Oral BID Colbert Ewing, MD   25 mg at 01/25/18 0909  . pregabalin (LYRICA) capsule 75 mg  75 mg Oral BID Colbert Ewing, MD   75 mg at 01/25/18 0910  . rivaroxaban (XARELTO) tablet 20 mg  20 mg Oral Q supper Colbert Ewing, MD   20 mg at 01/24/18 2320  . tamsulosin (FLOMAX) capsule 0.4 mg  0.4 mg Oral Daily Park, Derenda Mis, MD   0.4 mg at 01/25/18 7618     Discharge Medications: Please see discharge summary for a list of discharge medications.  Relevant Imaging Results:  Relevant Lab Results:   Additional Information SSN: [id number]  [place], LCSWA

## 2018-01-25 NOTE — Progress Notes (Signed)
PROGRESS NOTE    Charles Friedman  PNT:[id number] DOB: 1932-10-15 DOA: 01/24/2018 PCP: Haywood Pao, MD    Brief Narrative:  84 y.o. male with hx of afib (on xarelto), hx of CVA, DMT2, peripheral neuropathy, hx of prostate CA, and diastolic CHF with borderline EF 50-55%. Was admitted for ADHF in September with similar sx and had clinically improved with diuresis. Since then, patient has been able to ambulate and has been nearly independent with his ADLs. About 2-3 weeks ago, family reports that PCP had stopped metolazone but continued lasix due to concern for overdiuresis. Family has not noticed significant lower extremity but reports patient has been becoming increasingly dyspneic and fatigued with minimal exertion. Last weight about a week ago was around 235 lbs (not significantly changed from Nov but his dry weight from Sep was 218 lbs). Have not been able to weight him daily due to leg weakness. Patient reports that he feels short of breath. PCP resumed metolazone last evening/day prior to admission, and patient reports marked increase in urine output today. Home PT eval also noted O2 sat 92-93% (which is lower than baseline). Thus, given patient's persistent dyspnea and weakness, family opted to bring to [facility] ED.   Assessment & Plan:   Active Problems:   Type 2 diabetes mellitus with hyperglycemia (HCC)   Persistent atrial fibrillation   History of CVA (cerebrovascular accident)   Acute on chronic diastolic CHF (congestive heart failure), NYHA class 2 ([place])   Acute decompensated heart failure (HCC)   # Acute decompensated heart failure with known diastolic HFpEF -Uncertain dry wt -Given one dose of 40mg  IV lasix in ED with 650cc out overnight -Volume status is difficult to ascertain. Pt's current wt seems to be over reported dry wt of 235lbs per family, however pt does have dry mucus membranes, no significant LE edema, CXR without florid edema on my own personal read -Obtained  orthostatics, neg -Cr up to 1.25 -Will hold diuretic for now and repeat bmet in AM -re-eval volume status in AM. Question if patient benefits from gentle IVF hydration -Wean O2 as tolerated -continue home metoprolol tartrate 25mg  BID  # Peripheral neuropathy and deconditioning - resume pregalain - PT/OT eval with recommendations for SNF - resume home lexapro - As documented per RN, unable to even stand for one minute for othostatics, thus given advanced age, will likely need continued hospitalization and optimization  # Chronic afib on metop and xarelto. Prior hx of CVA - continue home metoprolol  - resume home xarelto -Stable at this time  # Chronic IDDM - initially resumed home lantus 35 units daily.  - low SSI AC with fingersticks ACHS -Per diabetic coordinator, decrease lantus to 28 units and add meal coverage  # Hx of prostate CA s/p XRT and obstruction - resume flomax    DVT prophylaxis: xarelto Code Status: Full Family Communication: Pt in room, family at bedside Disposition Plan: SNF, timing uncertain  Consultants:     Procedures:     Antimicrobials: Anti-infectives (From admission, onward)   None      Subjective: Feeling very weak today  Objective: Vitals:   01/24/18 2145 01/24/18 2220 01/24/18 2242 01/25/18 1221  BP: (!) 110/91  126/67 122/86  Pulse: 69  (!) 55 (!) 159  Resp: 20  20 20   Temp:  98.7 F (37.1 C) (!) 97.5 F (36.4 C) 98.6 F (37 C)  TempSrc:  Oral Oral Oral  SpO2: 93%  93% 94%  Weight:  109 kg   Height:   6\' 1"  (1.854 m)     Intake/Output Summary (Last 24 hours) at 01/25/2018 1646 Last data filed at 01/25/2018 1058 Gross per 24 hour  Intake 560 ml  Output 675 ml  Net -115 ml   Filed Weights   01/24/18 2242  Weight: 109 kg    Examination:  General exam: Appears calm and comfortable, mucus membranes dry Respiratory system: Clear to auscultation. Respiratory effort normal. No wheezing Cardiovascular system: S1 &  S2 heard, RRR. Gastrointestinal system: Abdomen is nondistended, soft and nontender. No organomegaly or masses felt. Normal bowel sounds heard. Central nervous system: Alert and oriented. No focal neurological deficits. Extremities: Symmetric 5 x 5 power. No significant LE edema Skin: No rashes, lesions  Psychiatry: Judgement and insight appear normal. Mood & affect appropriate.   Data Reviewed: I have personally reviewed following labs and imaging studies  CBC: Recent Labs  Lab 01/24/18 1712 01/25/18 0546  WBC 8.9 9.6  NEUTROABS 5.9  --   HGB 14.1 13.4  HCT 44.0 42.1  MCV 90.5 89.6  PLT 266 062   Basic Metabolic Panel: Recent Labs  Lab 01/24/18 1712 01/25/18 0546  NA 139 141  K 3.8 3.2*  CL 98 97*  CO2 28 33*  GLUCOSE 147* 65*  BUN 45* 44*  CREATININE 1.08 1.25*  CALCIUM 8.8* 8.7*  MG  --  1.8   GFR: Estimated Creatinine Clearance: 58 mL/min (A) (by C-G formula based on SCr of 1.25 mg/dL (H)). Liver Function Tests: Recent Labs  Lab 01/24/18 1712  AST 139*  ALT 100*  ALKPHOS 123  BILITOT 1.0  PROT 6.9  ALBUMIN 2.8*   Recent Labs  Lab 01/24/18 1712  LIPASE 30   No results for input(s): AMMONIA in the last 168 hours. Coagulation Profile: Recent Labs  Lab 01/24/18 1712  INR 1.32   Cardiac Enzymes: No results for input(s): CKTOTAL, CKMB, CKMBINDEX, TROPONINI in the last 168 hours. BNP (last 3 results) No results for input(s): PROBNP in the last 8760 hours. HbA1C: No results for input(s): HGBA1C in the last 72 hours. CBG: Recent Labs  Lab 01/24/18 2330 01/25/18 1124  GLUCAP 71 218*   Lipid Profile: No results for input(s): CHOL, HDL, LDLCALC, TRIG, CHOLHDL, LDLDIRECT in the last 72 hours. Thyroid Function Tests: No results for input(s): TSH, T4TOTAL, FREET4, T3FREE, THYROIDAB in the last 72 hours. Anemia Panel: No results for input(s): VITAMINB12, FOLATE, FERRITIN, TIBC, IRON, RETICCTPCT in the last 72 hours. Sepsis Labs: Recent Labs  Lab  01/24/18 1734  LATICACIDVEN 1.54    Recent Results (from the past 240 hour(s))  Culture, blood (routine x 2)     Status: None (Preliminary result)   Collection Time: 01/24/18  5:12 PM  Result Value Ref Range Status   Specimen Description BLOOD BLOOD RIGHT FOREARM  Final   Special Requests   Final    BOTTLES DRAWN AEROBIC ONLY Blood Culture adequate volume   Culture   Final    NO GROWTH < 24 HOURS Performed at [facility], [place] [address] [place], [place] [address]    Report Status PENDING  Incomplete  Culture, blood (routine x 2)     Status: None (Preliminary result)   Collection Time: 01/24/18  5:12 PM  Result Value Ref Range Status   Specimen Description BLOOD BLOOD RIGHT WRIST  Final   Special Requests   Final    BOTTLES DRAWN AEROBIC AND ANAEROBIC Blood Culture adequate volume  Culture   Final    NO GROWTH < 24 HOURS Performed at [facility], [place] [address] [place], [place] [address]    Report Status PENDING  Incomplete  Urine culture     Status: None   Collection Time: 01/24/18  5:12 PM  Result Value Ref Range Status   Specimen Description URINE, RANDOM  Final   Special Requests NONE  Final   Culture   Final    NO GROWTH Performed at [facility], [address] [place], [place] [address]    Report Status 01/25/2018 FINAL  Final     Radiology Studies: Ct Head Wo Contrast  Result Date: 01/24/2018 CLINICAL DATA:  Altered mental status. EXAM: CT HEAD WITHOUT CONTRAST TECHNIQUE: Contiguous axial images were obtained from the base of the skull through the vertex without intravenous contrast. COMPARISON:  10/20/2017 FINDINGS: Brain: There is no evidence for acute hemorrhage, hydrocephalus, mass lesion, or abnormal extra-axial fluid collection. No definite CT evidence for acute infarction. Diffuse loss of parenchymal volume is consistent with atrophy. Patchy low attenuation in the deep hemispheric and periventricular white matter is nonspecific, but  likely reflects chronic microvascular ischemic demyelination. Vascular: No hyperdense vessel or unexpected calcification. Skull: No evidence for fracture. No worrisome lytic or sclerotic lesion. Sinuses/Orbits: The visualized paranasal sinuses and mastoid air cells are clear. Visualized portions of the globes and intraorbital fat are unremarkable. Other: None. IMPRESSION: 1. Stable.  No acute intracranial abnormality. 2. Atrophy with chronic small vessel white matter ischemic disease. Electronically Signed   By: Misty Stanley M.D.   On: 01/24/2018 17:51   Dg Chest Port 1 View  Result Date: 01/24/2018 CLINICAL DATA:  Shortness of breath and weakness. Altered mental status. EXAM: PORTABLE CHEST 1 VIEW COMPARISON:  10/21/2017 FINDINGS: The heart is borderline enlarged but stable. There is tortuosity and calcification of the thoracic aorta. Chronic bronchitic type lung changes but no infiltrates or effusions. IMPRESSION: Mild stable cardiac enlargement but no acute pulmonary findings. Electronically Signed   By: Marijo Sanes M.D.   On: 01/24/2018 17:35    Scheduled Meds: . atorvastatin  40 mg Oral QPC supper  . escitalopram  5 mg Oral Daily  . insulin aspart  0-5 Units Subcutaneous QHS  . insulin aspart  0-9 Units Subcutaneous TID WC  . insulin aspart  3 Units Subcutaneous TID WC  . [START ON 01/26/2018] insulin glargine  28 Units Subcutaneous QAC breakfast  . metoprolol tartrate  25 mg Oral BID  . pregabalin  75 mg Oral BID  . rivaroxaban  20 mg Oral Q supper  . tamsulosin  0.4 mg Oral Daily   Continuous Infusions:   LOS: 1 day   Marylu Lund, MD [facility] Pager On Amion  If 7PM-7AM, please contact night-coverage 01/25/2018, 4:46 PM

## 2018-01-25 NOTE — Progress Notes (Signed)
Inpatient Diabetes Program Recommendations  AACE/ADA: New Consensus Statement on Inpatient Glycemic Control (2015)  Target Ranges:  Prepandial:   less than 140 mg/dL      Peak postprandial:   less than 180 mg/dL (1-2 hours)      Critically ill patients:  140 - 180 mg/dL   Lab Results  Component Value Date   GLUCAP 218 (H) 01/25/2018   HGBA1C 8.8 (H) 11/26/2015   Results for VERDELL, DYKMAN (MRN [id number]) as of 01/25/2018 12:50  Ref. Range 01/24/2018 23:30 01/25/2018 11:24  Glucose-Capillary Latest Ref Range: 70 - 99 mg/dL 71 218 (H)   Diabetes history: DM 2 Outpatient Diabetes medications:  Lantus 35 units with breakfast, Humalog 5 units  Current orders for Inpatient glycemic control:  Novolog sensitive tid with meals and HS Lantus 35 units with breakfast Inpatient Diabetes Program Recommendations:   Note lab glucose=65 mg/dL this morning.  Consider reducing Lantus to 28 units daily.  Also may consider adding Novolog meal coverage 3 units tid with meals (hold if patient eats less than 50%) in order to prevent post-prandial rises in blood sugars.   Thanks,  Adah Perl, RN, BC-ADM Inpatient Diabetes Coordinator Pager [phone] (8a-5p)

## 2018-01-25 NOTE — Evaluation (Signed)
Occupational Therapy Evaluation Patient Details Name: Charles Friedman MRN: [id number] DOB: [birth year] Today's Date: 01/25/2018    History of Present Illness Patient is a 84 y/o male who presents with SOB and weakness. Found to have acute decompensated CHF. Admitted in Sept for similar symptoms. PMH includes A-fib, CVA, bil cataracts, DM, glaucoma, diabetic neuropathy, prostate CA.   Clinical Impression   Pt with decline in function and safety with ADLs and ADL mobility with decreased strength, balance and endurance. Pt with hx of cognitive impairments. Pt's caregiver/family present and reports that pt was walking approximately 1 month ago and was receiving HH PT and was driving 4 months ago. Family planning for pt to d/c to SNF for rehab after acute stay. Pt would benefit from acute OT services to address impairments to maximize level of function and safety    Follow Up Recommendations  SNF;Supervision/Assistance - 24 hour    Equipment Recommendations  Other (comment)(TBD at next venue of care)    Recommendations for Other Services       Precautions / Restrictions Precautions Precautions: Fall Restrictions Weight Bearing Restrictions: No      Mobility Bed Mobility               General bed mobility comments: pt up in recliner upon arrival  Transfers Overall transfer level: Needs assistance Equipment used: Rolling walker (2 wheeled) Transfers: Sit to/from Stand Sit to Stand: Mod assist;From elevated surface         General transfer comment:  cues for hand placement and to scoot to edge of chair    Balance Overall balance assessment: Needs assistance;History of Falls Sitting-balance support: Feet supported;Single extremity supported Sitting balance-Leahy Scale: Fair Sitting balance - Comments: Requires UE support for sitting balance, posterior lean during dynamic tasks. Postural control: Posterior lean Standing balance support: During functional activity;Bilateral  upper extremity supported                               ADL either performed or assessed with clinical judgement   ADL Overall ADL's : Needs assistance/impaired                                             Vision Baseline Vision/History: Wears glasses Wears Glasses: Reading only Patient Visual Report: No change from baseline       Perception     Praxis      Pertinent Vitals/Pain Pain Assessment: No/denies pain     Hand Dominance Right   Extremity/Trunk Assessment Upper Extremity Assessment Upper Extremity Assessment: Overall WFL for tasks assessed   Lower Extremity Assessment Lower Extremity Assessment: Defer to PT evaluation   Cervical / Trunk Assessment Cervical / Trunk Assessment: Kyphotic   Communication Communication Communication: No difficulties   Cognition Arousal/Alertness: Awake/alert Behavior During Therapy: WFL for tasks assessed/performed Overall Cognitive Status: Impaired/Different from baseline Area of Impairment: Orientation;Memory;Following commands;Problem solving                 Orientation Level: Time;Disoriented to   Memory: Decreased short-term memory Following Commands: Follows one step commands with increased time     Problem Solving: Slow processing;Decreased initiation;Difficulty sequencing;Requires verbal cues;Requires tactile cues     General Comments       Exercises     Shoulder Instructions      Home Living  Family/patient expects to be discharged to:: Private residence Living Arrangements: Other (Comment);Other relatives(with caregivers) Available Help at Discharge: Personal care attendant;Available PRN/intermittently;Family Type of Home: House Home Access: Ramped entrance     Home Layout: Multi-level;Laundry or work area in [place] Shower/Tub: Occupational psychologist: [place]: Environmental consultant - 2 wheels;Shower seat;Bedside commode;Grab bars  - tub/shower;Grab bars - toilet;Wheelchair - manual          Prior Functioning/Environment Level of Independence: Needs assistance  Gait / Transfers Assistance Needed: Minimal walking with RW, uses w/c for mobility ADL's / Homemaking Assistance Needed: Assists with ADLs- LB dressing   Comments: pt's caregiver/family present to provide PLOF info        OT Problem List: Decreased strength;Decreased activity tolerance;Decreased cognition;Decreased knowledge of use of DME or AE;Impaired balance (sitting and/or standing);Decreased safety awareness      OT Treatment/Interventions: Self-care/ADL training;Therapeutic exercise;DME and/or AE instruction;Neuromuscular education;Therapeutic activities;Patient/family education    OT Goals(Current goals can be found in the care plan section) Acute Rehab OT Goals Patient Stated Goal: to feel better OT Goal Formulation: With patient/family Time For Goal Achievement: 02/08/18 Potential to Achieve Goals: Good  OT Frequency: Min 2X/week   Barriers to D/C: Decreased caregiver support          Co-evaluation              AM-PAC OT "6 Clicks" Daily Activity     Outcome Measure Help from another person eating meals?: None Help from another person taking care of personal grooming?: A Little Help from another person toileting, which includes using toliet, bedpan, or urinal?: A Lot Help from another person bathing (including washing, rinsing, drying)?: A Lot Help from another person to put on and taking off regular upper body clothing?: A Little Help from another person to put on and taking off regular lower body clothing?: Total 6 Click Score: 15   End of Session Equipment Utilized During Treatment: Gait belt;Rolling walker  Activity Tolerance: Patient tolerated treatment well Patient left: in chair;with call bell/phone within reach;with chair alarm set;with family/visitor present  OT Visit Diagnosis: Unsteadiness on feet (R26.81);Other  abnormalities of gait and mobility (R26.89);Muscle weakness (generalized) (M62.81);History of falling (Z91.81);Other symptoms and signs involving cognitive function                Time: 0998-3382 OT Time Calculation (min): 27 min Charges:  OT General Charges $OT Visit: 1 Visit OT Evaluation $OT Eval Moderate Complexity: 1 Mod OT Treatments $Therapeutic Activity: 8-22 mins    Britt Bottom 01/25/2018, 1:35 PM

## 2018-01-25 NOTE — Clinical Social Work Note (Signed)
Clinical Social Work Assessment  Patient Details  Name: Charles Friedman MRN: [id number] Date of Birth: Jul 31, 1932  Date of referral:  01/25/18               Reason for consult:  Discharge Planning                Permission sought to share information with:  Case Manager, Family Supports Permission granted to share information::  Yes, Verbal Permission Granted  Name::     Bonifacio Pruden  Agency::  [facility]  Relationship::  Son  Sport and exercise psychologist Information:     Housing/Transportation Living arrangements for the past 2 months:  Single Family Home Source of Information:  Adult Children, Power of Attorney Patient Interpreter Needed:  None Criminal Activity/Legal Involvement Pertinent to Current Situation/Hospitalization:  No - Comment as needed Significant Relationships:  Adult Children Lives with:  Self Do you feel safe going back to the place where you live?  Yes Need for family participation in patient care:  Yes (Comment)(Patient is confused. )  Care giving concerns: Patient is confused and is not able to make care decisions for himself. Family was notified. Son is financial POA and had questions about becoming his medical POA. Family wants the patient to be discharged to a skilled nursing facility so that he is able to gain back some mobility.    Social Worker assessment / plan: CSW completed the fl2 note and spoke with the patient's son. Patient's son is financial POA of his father's affairs. Patient's son had questions about how to become medical POA. CSW spoke with RN to put in a consult for the chaplain. CSW sent patient's information over to [facility] nursing facility.   Employment status:  Retired Forensic scientist:  Medicare PT Recommendations:  [place] / Referral to community resources:  [place]  Patient/Family's Response to care: Patient's son is in support of his father receiving additional support services. Patient's family would like for  the patient to go to [facility] skilled nursing facility.   Patient/Family's Understanding of and Emotional Response to Diagnosis, Current Treatment, and Prognosis:  Patient's is in support of the patient being discharged to a skilled nursing facility so that he is able to gain mobility back.   Emotional Assessment Appearance:  Appears stated age Attitude/Demeanor/Rapport:  Unable to Assess Affect (typically observed):  Unable to Assess Orientation:  Oriented to Self, Oriented to Place Alcohol / Substance use:  Not Applicable Psych involvement (Current and /or in the community):  No (Comment)  Discharge Needs  Concerns to be addressed:  Decision making concerns, Cognitive Concerns Readmission within the last 30 days:  No Current discharge risk:  Dependent with Mobility Barriers to Discharge:  Continued Medical Work up   [employer] [place] 01/25/2018, 2:28 PM

## 2018-01-26 LAB — BASIC METABOLIC PANEL
ANION GAP: 13 (ref 5–15)
BUN: 53 mg/dL — ABNORMAL HIGH (ref 8–23)
CO2: 27 mmol/L (ref 22–32)
Calcium: 8.6 mg/dL — ABNORMAL LOW (ref 8.9–10.3)
Chloride: 98 mmol/L (ref 98–111)
Creatinine, Ser: 1.39 mg/dL — ABNORMAL HIGH (ref 0.61–1.24)
GFR calc Af Amer: 54 mL/min — ABNORMAL LOW (ref >60)
GFR calc non Af Amer: 47 mL/min — ABNORMAL LOW (ref >60)
Glucose, Bld: 157 mg/dL — ABNORMAL HIGH (ref 70–99)
Potassium: 3.7 mmol/L (ref 3.5–5.1)
Sodium: 138 mmol/L (ref 135–145)

## 2018-01-26 LAB — GLUCOSE, CAPILLARY
Glucose-Capillary: 165 mg/dL — ABNORMAL HIGH (ref 70–99)
Glucose-Capillary: 185 mg/dL — ABNORMAL HIGH (ref 70–99)
Glucose-Capillary: 220 mg/dL — ABNORMAL HIGH (ref 70–99)
Glucose-Capillary: 205 mg/dL — ABNORMAL HIGH (ref 70–99)

## 2018-01-26 LAB — CBC
HCT: 41 % (ref 39.0–52.0)
HEMOGLOBIN: 13.3 g/dL (ref 13.0–17.0)
MCH: 28.9 pg (ref 26.0–34.0)
MCHC: 32.4 g/dL (ref 30.0–36.0)
MCV: 88.9 fL (ref 80.0–100.0)
Platelets: 247 10*3/uL (ref 150–400)
RBC: 4.61 MIL/uL (ref 4.22–5.81)
RDW: 13.2 % (ref 11.5–15.5)
WBC: 8.2 10*3/uL (ref 4.0–10.5)
nRBC: 0 % (ref 0.0–0.2)

## 2018-01-26 LAB — MAGNESIUM: Magnesium: 2 mg/dL (ref 1.7–2.4)

## 2018-01-26 MED ORDER — IPRATROPIUM-ALBUTEROL 0.5-2.5 (3) MG/3ML IN SOLN
3.0000 mL | RESPIRATORY_TRACT | Status: DC | PRN
  Administered 2018-01-26: 3 mL via RESPIRATORY_TRACT
  Filled 2018-01-26: qty 3

## 2018-01-26 MED ORDER — LACTATED RINGERS IV SOLN
INTRAVENOUS | Status: DC
  Administered 2018-01-26: 75 mL/h via INTRAVENOUS
  Administered 2018-01-26: via INTRAVENOUS

## 2018-01-26 NOTE — Progress Notes (Signed)
PROGRESS NOTE    Charles Friedman  WUG:[id number] DOB: February 26, 1932 DOA: 01/24/2018 PCP: Haywood Pao, MD    Brief Narrative:  84 y.o. male with hx of afib (on xarelto), hx of CVA, DMT2, peripheral neuropathy, hx of prostate CA, and diastolic CHF with borderline EF 50-55%. Was admitted for ADHF in September with similar sx and had clinically improved with diuresis. Since then, patient has been able to ambulate and has been nearly independent with his ADLs. About 2-3 weeks ago, family reports that PCP had stopped metolazone but continued lasix due to concern for overdiuresis. Family has not noticed significant lower extremity but reports patient has been becoming increasingly dyspneic and fatigued with minimal exertion. Last weight about a week ago was around 235 lbs (not significantly changed from Nov but his dry weight from Sep was 218 lbs). Have not been able to weight him daily due to leg weakness. Patient reports that he feels short of breath. PCP resumed metolazone last evening/day prior to admission, and patient reports marked increase in urine output today. Home PT eval also noted O2 sat 92-93% (which is lower than baseline). Thus, given patient's persistent dyspnea and weakness, family opted to bring to [facility] ED.   Assessment & Plan:   Active Problems:   Type 2 diabetes mellitus with hyperglycemia (HCC)   Persistent atrial fibrillation   History of CVA (cerebrovascular accident)   Acute on chronic diastolic CHF (congestive heart failure), NYHA class 2 ([place])   Acute decompensated heart failure ([place])   # Chronic heart failure with known diastolic HFpEF -Patient is clinically dehydrated with dry mucous membranes, poor skin turgor, worsening renal function -Orthostatic vital signs reviewed, negative -Patient with markedly increased weakness such that he is unable to stand for even a minute noted per RN. -Concerns for possible over diuresis/dehydration.  Have started patient on gentle IV fluid  hydration. -Wean O2 as tolerated -continue home metoprolol tartrate 25mg  BID  # Peripheral neuropathy and deconditioning - resume pregalain - PT/OT eval with recommendations for SNF - resume home lexapro -Continue IV fluid hydration  # Chronic afib on metop and xarelto. Prior hx of CVA - continue home metoprolol  - resume home xarelto -No signs of acute bleed at this time  # Chronic IDDM - initially resumed home lantus 35 units daily.  - low SSI AC with fingersticks ACHS -Per diabetic coordinator, decrease lantus to 28 units and add meal coverage  # Hx of prostate CA s/p XRT and obstruction -Resume Flomax  #Suspected dehydration -Per above, dry mucous membranes, poor skin turgor, worsening renal function and increased weakness in setting of aggressive diuresis prior to admission gives suggestion for possible dehydration. -We will continue patient on gentle IV fluid hydration -Repeat renal function in morning    DVT prophylaxis: xarelto Code Status: Full Family Communication: Pt in room, family at bedside Disposition Plan: SNF, timing uncertain  Consultants:     Procedures:     Antimicrobials: Anti-infectives (From admission, onward)   None      Subjective: Still feeling subjective sob  Objective: Vitals:   01/26/18 0548 01/26/18 0901 01/26/18 1321 01/26/18 1512  BP: (!) 116/48 128/64 (!) 100/50   Pulse: 76 93 69 71  Resp: 18  18 16   Temp: 98.4 F (36.9 C)  98.6 F (37 C)   TempSrc: Oral  Oral   SpO2: 92%  93% 92%  Weight: 109 kg     Height:        Intake/Output Summary (  Last 24 hours) at 01/26/2018 1610 Last data filed at 01/26/2018 1200 Gross per 24 hour  Intake 320 ml  Output 750 ml  Net -430 ml   Filed Weights   01/24/18 2242 01/26/18 0548  Weight: 109 kg 109 kg    Examination: General exam: Awake, laying in bed, in nad Respiratory system: Normal respiratory effort, no wheezing Cardiovascular system: regular rate, s1,  s2 Gastrointestinal system: Soft, nondistended, positive BS Central nervous system: CN2-12 grossly intact, strength intact Extremities: Perfused, no clubbing Skin: Normal skin turgor, no notable skin lesions seen Psychiatry: Mood normal // no visual hallucinations   Data Reviewed: I have personally reviewed following labs and imaging studies  CBC: Recent Labs  Lab 01/24/18 1712 01/25/18 0546 01/26/18 0454  WBC 8.9 9.6 8.2  NEUTROABS 5.9  --   --   HGB 14.1 13.4 13.3  HCT 44.0 42.1 41.0  MCV 90.5 89.6 88.9  PLT 266 289 315   Basic Metabolic Panel: Recent Labs  Lab 01/24/18 1712 01/25/18 0546 01/26/18 0454  NA 139 141 138  K 3.8 3.2* 3.7  CL 98 97* 98  CO2 28 33* 27  GLUCOSE 147* 65* 157*  BUN 45* 44* 53*  CREATININE 1.08 1.25* 1.39*  CALCIUM 8.8* 8.7* 8.6*  MG  --  1.8 2.0   GFR: Estimated Creatinine Clearance: 52.1 mL/min (A) (by C-G formula based on SCr of 1.39 mg/dL (H)). Liver Function Tests: Recent Labs  Lab 01/24/18 1712  AST 139*  ALT 100*  ALKPHOS 123  BILITOT 1.0  PROT 6.9  ALBUMIN 2.8*   Recent Labs  Lab 01/24/18 1712  LIPASE 30   No results for input(s): AMMONIA in the last 168 hours. Coagulation Profile: Recent Labs  Lab 01/24/18 1712  INR 1.32   Cardiac Enzymes: No results for input(s): CKTOTAL, CKMB, CKMBINDEX, TROPONINI in the last 168 hours. BNP (last 3 results) No results for input(s): PROBNP in the last 8760 hours. HbA1C: No results for input(s): HGBA1C in the last 72 hours. CBG: Recent Labs  Lab 01/25/18 1124 01/25/18 1711 01/25/18 2120 01/26/18 0754 01/26/18 1126  GLUCAP 218* 272* 243* 165* 185*   Lipid Profile: No results for input(s): CHOL, HDL, LDLCALC, TRIG, CHOLHDL, LDLDIRECT in the last 72 hours. Thyroid Function Tests: No results for input(s): TSH, T4TOTAL, FREET4, T3FREE, THYROIDAB in the last 72 hours. Anemia Panel: No results for input(s): VITAMINB12, FOLATE, FERRITIN, TIBC, IRON, RETICCTPCT in the last  72 hours. Sepsis Labs: Recent Labs  Lab 01/24/18 1734  LATICACIDVEN 1.54    Recent Results (from the past 240 hour(s))  Culture, blood (routine x 2)     Status: None (Preliminary result)   Collection Time: 01/24/18  5:12 PM  Result Value Ref Range Status   Specimen Description BLOOD BLOOD RIGHT FOREARM  Final   Special Requests   Final    BOTTLES DRAWN AEROBIC ONLY Blood Culture adequate volume   Culture   Final    NO GROWTH 2 DAYS Performed at [facility], [place] [address], [place], [place] [address]    Report Status PENDING  Incomplete  Culture, blood (routine x 2)     Status: None (Preliminary result)   Collection Time: 01/24/18  5:12 PM  Result Value Ref Range Status   Specimen Description BLOOD BLOOD RIGHT WRIST  Final   Special Requests   Final    BOTTLES DRAWN AEROBIC AND ANAEROBIC Blood Culture adequate volume   Culture   Final  NO GROWTH 2 DAYS Performed at [facility], [place] [address], [place], [place] [address]    Report Status PENDING  Incomplete  Urine culture     Status: None   Collection Time: 01/24/18  5:12 PM  Result Value Ref Range Status   Specimen Description URINE, RANDOM  Final   Special Requests NONE  Final   Culture   Final    NO GROWTH Performed at [facility], [address], [place], [place] [address]    Report Status 01/25/2018 FINAL  Final     Radiology Studies: Ct Head Wo Contrast  Result Date: 01/24/2018 CLINICAL DATA:  Altered mental status. EXAM: CT HEAD WITHOUT CONTRAST TECHNIQUE: Contiguous axial images were obtained from the base of the skull through the vertex without intravenous contrast. COMPARISON:  10/20/2017 FINDINGS: Brain: There is no evidence for acute hemorrhage, hydrocephalus, mass lesion, or abnormal extra-axial fluid collection. No definite CT evidence for acute infarction. Diffuse loss of parenchymal volume is consistent with atrophy. Patchy low attenuation in the deep hemispheric and periventricular  white matter is nonspecific, but likely reflects chronic microvascular ischemic demyelination. Vascular: No hyperdense vessel or unexpected calcification. Skull: No evidence for fracture. No worrisome lytic or sclerotic lesion. Sinuses/Orbits: The visualized paranasal sinuses and mastoid air cells are clear. Visualized portions of the globes and intraorbital fat are unremarkable. Other: None. IMPRESSION: 1. Stable.  No acute intracranial abnormality. 2. Atrophy with chronic small vessel white matter ischemic disease. Electronically Signed   By: Misty Stanley M.D.   On: 01/24/2018 17:51   Dg Chest Port 1 View  Result Date: 01/24/2018 CLINICAL DATA:  Shortness of breath and weakness. Altered mental status. EXAM: PORTABLE CHEST 1 VIEW COMPARISON:  10/21/2017 FINDINGS: The heart is borderline enlarged but stable. There is tortuosity and calcification of the thoracic aorta. Chronic bronchitic type lung changes but no infiltrates or effusions. IMPRESSION: Mild stable cardiac enlargement but no acute pulmonary findings. Electronically Signed   By: Marijo Sanes M.D.   On: 01/24/2018 17:35    Scheduled Meds: . atorvastatin  40 mg Oral QPC supper  . escitalopram  5 mg Oral Daily  . insulin aspart  0-5 Units Subcutaneous QHS  . insulin aspart  0-9 Units Subcutaneous TID WC  . insulin aspart  3 Units Subcutaneous TID WC  . insulin glargine  28 Units Subcutaneous QAC breakfast  . metoprolol tartrate  25 mg Oral BID  . pregabalin  75 mg Oral BID  . rivaroxaban  20 mg Oral Q supper  . tamsulosin  0.4 mg Oral Daily   Continuous Infusions: . lactated ringers 75 mL/hr (01/26/18 1122)     LOS: 2 days   Marylu Lund, MD [facility] Pager On Amion  If 7PM-7AM, please contact night-coverage 01/26/2018, 4:10 PM

## 2018-01-27 ENCOUNTER — Inpatient Hospital Stay (HOSPITAL_COMMUNITY): Payer: Medicare Other

## 2018-01-27 DIAGNOSIS — L8990 Pressure ulcer of unspecified site, unspecified stage: Secondary | ICD-10-CM

## 2018-01-27 LAB — COMPREHENSIVE METABOLIC PANEL
ALT: 89 U/L — ABNORMAL HIGH (ref 0–44)
AST: 122 U/L — ABNORMAL HIGH (ref 15–41)
Albumin: 2.5 g/dL — ABNORMAL LOW (ref 3.5–5.0)
Alkaline Phosphatase: 102 U/L (ref 38–126)
Anion gap: 9 (ref 5–15)
BUN: 52 mg/dL — ABNORMAL HIGH (ref 8–23)
CO2: 29 mmol/L (ref 22–32)
Calcium: 8.7 mg/dL — ABNORMAL LOW (ref 8.9–10.3)
Chloride: 100 mmol/L (ref 98–111)
Creatinine, Ser: 1.22 mg/dL (ref 0.61–1.24)
GFR calc Af Amer: >60 mL/min (ref >60)
GFR calc non Af Amer: 54 mL/min — ABNORMAL LOW (ref >60)
Glucose, Bld: 127 mg/dL — ABNORMAL HIGH (ref 70–99)
Potassium: 3.7 mmol/L (ref 3.5–5.1)
Sodium: 138 mmol/L (ref 135–145)
Total Bilirubin: 1.1 mg/dL (ref 0.3–1.2)
Total Protein: 6.2 g/dL — ABNORMAL LOW (ref 6.5–8.1)

## 2018-01-27 LAB — CBC
HCT: 41.1 % (ref 39.0–52.0)
Hemoglobin: 13.1 g/dL (ref 13.0–17.0)
MCH: 28.5 pg (ref 26.0–34.0)
MCHC: 31.9 g/dL (ref 30.0–36.0)
MCV: 89.5 fL (ref 80.0–100.0)
Platelets: 274 10*3/uL (ref 150–400)
RBC: 4.59 MIL/uL (ref 4.22–5.81)
RDW: 13.4 % (ref 11.5–15.5)
WBC: 8.4 10*3/uL (ref 4.0–10.5)
nRBC: 0 % (ref 0.0–0.2)

## 2018-01-27 LAB — MAGNESIUM: Magnesium: 2.1 mg/dL (ref 1.7–2.4)

## 2018-01-27 LAB — GLUCOSE, CAPILLARY
Glucose-Capillary: 119 mg/dL — ABNORMAL HIGH (ref 70–99)
Glucose-Capillary: 129 mg/dL — ABNORMAL HIGH (ref 70–99)
Glucose-Capillary: 132 mg/dL — ABNORMAL HIGH (ref 70–99)

## 2018-01-27 MED ORDER — FUROSEMIDE 10 MG/ML IJ SOLN
40.0000 mg | Freq: Every day | INTRAMUSCULAR | Status: DC
  Administered 2018-01-27 – 2018-01-28 (×2): 40 mg via INTRAVENOUS
  Filled 2018-01-27 (×2): qty 4

## 2018-01-27 NOTE — Progress Notes (Addendum)
PROGRESS NOTE  Charles Friedman WNI:[id number] DOB: 1932-04-15 DOA: 01/24/2018 PCP: Haywood Pao, MD  HPI/Recap of past 24 hours: 84 y.o.malewithhx of afib (on xarelto), hx of CVA, DMT2, peripheral neuropathy, hx of prostate CA, and diastolic CHF with borderline EF 50-55%. Was admitted for ADHF in September with similar sx and had clinically improved with diuresis. Since then, patient has been able to ambulate and has been nearly independent with his ADLs. About 2-3 weeks ago, family reports that PCP had stopped metolazone but continued lasix due to concern for overdiuresis. Family has not noticed significant lower extremity but reports patient has been becoming increasingly dyspneic and fatigued with minimal exertion. Last weight about a week ago was around 235 lbs (not significantly changed from Nov but his dry weight from Sep was 218 lbs). Have not been able to weight him daily due to leg weakness. Patient reports that he feels short of breath. PCP resumed metolazone last evening/day prior to admission, and patient reports marked increase in urine output today. Home PT eval also noted O2 sat 92-93% (which is lower than baseline). Thus, given patient's persistent dyspnea and weakness, family opted to bring to [facility] ED.  01/27/2018: Patient seen and examined his bedside.  Reports dyspnea at rest.  Obtain chest x-ray.  Assessment/Plan: Active Problems:   Type 2 diabetes mellitus with hyperglycemia (HCC)   Persistent atrial fibrillation   History of CVA (cerebrovascular accident)   Acute on chronic diastolic CHF (congestive heart failure), NYHA class 2 (HCC)   Acute decompensated heart failure (HCC)   Pressure injury of skin   Acute on chronic diastolic CHF Independently reviewed chest x-ray done on 01/27/2018 which revealed cardiomegaly with increase in pulmonary vascularity suggestive of pulmonary edema Start IV Lasix 40 mg daily Strict I's and O's and daily weight Closely monitor fluid  status Last 2D echo done on 10/2017 revealed normal LVEF  Peripheral neuropathy and physical debility Resume home medications PT OT to evaluate Fall precautions  Chronic A. fib on metoprolol and Xarelto for CVA prophylaxis Continue metoprolol for rate control Continue to monitor on telemetry  Type 2 diabetes complicated by hyperglycemia Obtain A1c Continue insulin sliding scale Continue Lantus and NovoLog  Chronic depression/anxiety Continue escitalopram  BPH Continue tamsulosin  AKI Suspect prerenal Resolving Presented with creatinine of 1.36 with baseline creatinine of 1.0 and GFR of greater than 60 Repeat BMP in the morning Avoid nephrotoxic agents Monitor urine output   DVT prophylaxis: xarelto Code Status: Full Family Communication: Pt in room, family at bedside Disposition Plan: SNF, timing uncertain  Consultants:   None  Procedures:   None  Objective: Vitals:   01/26/18 2113 01/27/18 0514 01/27/18 0817 01/27/18 1234  BP: 128/78 (!) 119/55 (!) 109/58 98/79  Pulse: 63 81 68 86  Resp:  18  16  Temp:  (!) 97.3 F (36.3 C)  98 F (36.7 C)  TempSrc:  Oral  Oral  SpO2:  91%  (!) 89%  Weight:  111 kg    Height:        Intake/Output Summary (Last 24 hours) at 01/27/2018 1833 Last data filed at 01/27/2018 1447 Gross per 24 hour  Intake 1526.21 ml  Output 701 ml  Net 825.21 ml   Filed Weights   01/24/18 2242 01/26/18 0548 01/27/18 0514  Weight: 109 kg 109 kg 111 kg    Exam:  . General: 84 y.o. year-old male well developed well nourished in no acute distress.  Alert and oriented x3. Marland Kitchen  Cardiovascular: Regular rate and rhythm with no rubs or gallops.  No thyromegaly or JVD noted.   Marland Kitchen Respiratory: Diffuse rales bilaterally with right JVD.  Good inspiratory effort. . Abdomen: Soft nontender nondistended with normal bowel sounds x4 quadrants. . Musculoskeletal: Trace lower extremity edema. 2/4 pulses in all 4 extremities. Marland Kitchen Psychiatry: Mood is  appropriate for condition and setting   Data Reviewed: CBC: Recent Labs  Lab 01/24/18 1712 01/25/18 0546 01/26/18 0454 01/27/18 0526  WBC 8.9 9.6 8.2 8.4  NEUTROABS 5.9  --   --   --   HGB 14.1 13.4 13.3 13.1  HCT 44.0 42.1 41.0 41.1  MCV 90.5 89.6 88.9 89.5  PLT 266 289 247 202   Basic Metabolic Panel: Recent Labs  Lab 01/24/18 1712 01/25/18 0546 01/26/18 0454 01/27/18 0526  NA 139 141 138 138  K 3.8 3.2* 3.7 3.7  CL 98 97* 98 100  CO2 28 33* 27 29  GLUCOSE 147* 65* 157* 127*  BUN 45* 44* 53* 52*  CREATININE 1.08 1.25* 1.39* 1.22  CALCIUM 8.8* 8.7* 8.6* 8.7*  MG  --  1.8 2.0 2.1   GFR: Estimated Creatinine Clearance: 59.9 mL/min (by C-G formula based on SCr of 1.22 mg/dL). Liver Function Tests: Recent Labs  Lab 01/24/18 1712 01/27/18 0526  AST 139* 122*  ALT 100* 89*  ALKPHOS 123 102  BILITOT 1.0 1.1  PROT 6.9 6.2*  ALBUMIN 2.8* 2.5*   Recent Labs  Lab 01/24/18 1712  LIPASE 30   No results for input(s): AMMONIA in the last 168 hours. Coagulation Profile: Recent Labs  Lab 01/24/18 1712  INR 1.32   Cardiac Enzymes: No results for input(s): CKTOTAL, CKMB, CKMBINDEX, TROPONINI in the last 168 hours. BNP (last 3 results) No results for input(s): PROBNP in the last 8760 hours. HbA1C: No results for input(s): HGBA1C in the last 72 hours. CBG: Recent Labs  Lab 01/26/18 1625 01/26/18 2102 01/27/18 0723 01/27/18 1147 01/27/18 1629  GLUCAP 220* 205* 119* 129* 132*   Lipid Profile: No results for input(s): CHOL, HDL, LDLCALC, TRIG, CHOLHDL, LDLDIRECT in the last 72 hours. Thyroid Function Tests: No results for input(s): TSH, T4TOTAL, FREET4, T3FREE, THYROIDAB in the last 72 hours. Anemia Panel: No results for input(s): VITAMINB12, FOLATE, FERRITIN, TIBC, IRON, RETICCTPCT in the last 72 hours. Urine analysis:    Component Value Date/Time   COLORURINE YELLOW 01/24/2018 1712   APPEARANCEUR CLEAR 01/24/2018 1712   LABSPEC 1.008 01/24/2018 1712     PHURINE 5.0 01/24/2018 1712   GLUCOSEU NEGATIVE 01/24/2018 1712   HGBUR MODERATE (A) 01/24/2018 1712   BILIRUBINUR NEGATIVE 01/24/2018 1712   KETONESUR NEGATIVE 01/24/2018 1712   PROTEINUR NEGATIVE 01/24/2018 1712   UROBILINOGEN 0.2 07/15/2014 2038   NITRITE NEGATIVE 01/24/2018 1712   LEUKOCYTESUR NEGATIVE 01/24/2018 1712   Sepsis Labs: @LABRCNTIP (procalcitonin:4,lacticidven:4)  ) Recent Results (from the past 240 hour(s))  Culture, blood (routine x 2)     Status: None (Preliminary result)   Collection Time: 01/24/18  5:12 PM  Result Value Ref Range Status   Specimen Description BLOOD BLOOD RIGHT FOREARM  Final   Special Requests   Final    BOTTLES DRAWN AEROBIC ONLY Blood Culture adequate volume   Culture   Final    NO GROWTH 3 DAYS Performed at [facility], [place] [address] [place], [place] [address]    Report Status PENDING  Incomplete  Culture, blood (routine x 2)     Status: None (Preliminary result)  Collection Time: 01/24/18  5:12 PM  Result Value Ref Range Status   Specimen Description BLOOD BLOOD RIGHT WRIST  Final   Special Requests   Final    BOTTLES DRAWN AEROBIC AND ANAEROBIC Blood Culture adequate volume   Culture   Final    NO GROWTH 3 DAYS Performed at [facility], [address], [place], [place] [address]    Report Status PENDING  Incomplete  Urine culture     Status: None   Collection Time: 01/24/18  5:12 PM  Result Value Ref Range Status   Specimen Description URINE, RANDOM  Final   Special Requests NONE  Final   Culture   Final    NO GROWTH Performed at [facility], [address], [place], [place] [address]    Report Status 01/25/2018 FINAL  Final      Studies: Dg Chest Port 1 View  Result Date: 01/27/2018 CLINICAL DATA:  Shortness of breath on exertion EXAM: PORTABLE CHEST 1 VIEW COMPARISON:  01/24/2018 FINDINGS: Cardiomegaly with vascular congestion. Increasing interstitial prominence in the lungs could reflect  interstitial edema. Bibasilar atelectasis. No visible effusions or acute bony abnormality. IMPRESSION: Cardiomegaly with vascular congestion and interstitial prominence, likely interstitial edema. Bibasilar atelectasis. Electronically Signed   By: Rolm Baptise M.D.   On: 01/27/2018 09:44    Scheduled Meds: . atorvastatin  40 mg Oral QPC supper  . escitalopram  5 mg Oral Daily  . insulin aspart  0-5 Units Subcutaneous QHS  . insulin aspart  0-9 Units Subcutaneous TID WC  . insulin aspart  3 Units Subcutaneous TID WC  . insulin glargine  28 Units Subcutaneous QAC breakfast  . metoprolol tartrate  25 mg Oral BID  . pregabalin  75 mg Oral BID  . rivaroxaban  20 mg Oral Q supper  . tamsulosin  0.4 mg Oral Daily    Continuous Infusions:   LOS: 3 days     Kayleen Memos, MD [facility] Pager [phone]  If 7PM-7AM, please contact night-coverage [email or web address] Password [facility] 01/27/2018, 6:33 PM

## 2018-01-27 NOTE — Progress Notes (Signed)
Physical Therapy Treatment Patient Details Name: Charles Friedman MRN: [id number] DOB: 04/05/1932 Today's Date: 01/27/2018    History of Present Illness Patient is a 84 y.o. male admitted 01/24/18 with SOB and weakness. Found to have acute decompensated CHF. Of note, admitted in Sept for similar symptoms. PMH includes A-fib, CVA, bil cataracts, DM, glaucoma, diabetic neuropathy, prostate CA.   PT Comments    Pt requiring increased assist for mobility this session. MaxA+2 to stand and pivot from [facility] to recliner; dependent for pericare. Pt pleasant and motivated to participate; demonstrates decreased safety awareness and poor insight into deficits, requiring maxA+2 and multimodal cues to prevent fall during transfer. Recommend nursing staff use lift equipment as need for OOB mobility. Will continue to follow acutely.    Follow Up Recommendations  SNF;Supervision for mobility/OOB     Equipment Recommendations  None recommended by PT    Recommendations for Other Services       Precautions / Restrictions Precautions Precautions: Fall Precaution Comments: Inconsistent with amount of assist required Restrictions Weight Bearing Restrictions: No    Mobility  Bed Mobility               General bed mobility comments: Pt received on BSC. Significant increased time and effort to scoot hips forwards to edge of BSC and scoot hip backwards in recliner; cues for technique  Transfers Overall transfer level: Needs assistance Equipment used: Rolling walker (2 wheeled) Transfers: Sit to/from [facility] Sit to Stand: Max assist;+2 physical assistance Stand pivot transfers: Max assist;+2 physical assistance       General transfer comment: Pt unable to stand on initial trial despite maxA. Reliant on maxA+2 to stand from [facility]; max cues to achieve fully upright posture and to not sit prematurely as pt attempting to sit depsite not being to chair yet  Ambulation/Gait              General Gait Details: Unable to take steps with maxA+2 and RW   Stairs             Wheelchair Mobility    Modified Rankin (Stroke Patients Only)       Balance Overall balance assessment: Needs assistance;History of Falls Sitting-balance support: Feet supported;Single extremity supported Sitting balance-Leahy Scale: Poor Sitting balance - Comments: Requires UE support for sitting balance, posterior lean during dynamic tasks. Postural control: Posterior lean Standing balance support: During functional activity;Bilateral upper extremity supported Standing balance-Leahy Scale: Zero Standing balance comment: Reliant on BUE support, maxA; dependent for pericare while standing                            Cognition Arousal/Alertness: Awake/alert Behavior During Therapy: WFL for tasks assessed/performed Overall Cognitive Status: No family/caregiver present to determine baseline cognitive functioning Area of Impairment: Memory;Following commands;Problem solving;Attention;Safety/judgement;Awareness                   Current Attention Level: Selective Memory: Decreased short-term memory Following Commands: Follows one step commands with increased time Safety/Judgement: Decreased awareness of safety;Decreased awareness of deficits Awareness: Emergent Problem Solving: Slow processing;Decreased initiation;Difficulty sequencing;Requires verbal cues;Requires tactile cues General Comments: Pt attempting to sit without warning despite not being near chair and max cues for safety      Exercises      General Comments        Pertinent Vitals/Pain Pain Assessment: No/denies pain    Home Living  Prior Function            PT Goals (current goals can now be found in the care plan section) Acute Rehab PT Goals Patient Stated Goal: to feel better PT Goal Formulation: With patient Time For Goal Achievement: 02/08/18 Potential to  Achieve Goals: Good Progress towards PT goals: Not progressing toward goals - comment    Frequency    Min 2X/week      PT Plan Frequency needs to be updated    Co-evaluation              AM-PAC PT "6 Clicks" Mobility   Outcome Measure  Help needed turning from your back to your side while in a flat bed without using bedrails?: A Lot Help needed moving from lying on your back to sitting on the side of a flat bed without using bedrails?: A Lot Help needed moving to and from a bed to a chair (including a wheelchair)?: A Lot Help needed standing up from a chair using your arms (e.g., wheelchair or bedside chair)?: A Lot Help needed to walk in hospital room?: A Lot Help needed climbing 3-5 steps with a railing? : Total 6 Click Score: 11    End of Session Equipment Utilized During Treatment: Gait belt Activity Tolerance: Patient limited by fatigue Patient left: in chair;with call bell/phone within reach;with chair alarm set Nurse Communication: Mobility status PT Visit Diagnosis: Muscle weakness (generalized) (M62.81);Difficulty in walking, not elsewhere classified (R26.2)     Time: 3212-2482 PT Time Calculation (min) (ACUTE ONLY): 19 min  Charges:  $Therapeutic Activity: 8-22 mins                    Mabeline Caras, PT, DPT Acute Rehabilitation Services  Pager [phone] Office [phone]  Derry Lory 01/27/2018, 11:18 AM

## 2018-01-27 NOTE — Care Management Important Message (Signed)
Important Message  Patient Details  Name: ALLARD LIGHTSEY MRN: [id number] Date of Birth: [birth year]   Medicare Important Message Given:  Yes    Barb Merino Dudley 01/27/2018, 12:19 PM

## 2018-01-27 NOTE — Consult Note (Signed)
   [facility] CM Inpatient Consult   01/27/2018  JOHNROBERT FOTI 1932/08/02 [id number]  Patient screened for high risk score for unplanned readmission [place] Management services.  Chart reviewed and patient is confused. Patient is being recommended for skilled nursing facility. No current [facility] Care Management needs assessed.  For questions contact:   Natividad Brood, RN BSN [facility] Liaison  [phone] business mobile phone Toll free office [phone]

## 2018-01-28 DIAGNOSIS — R112 Nausea with vomiting, unspecified: Secondary | ICD-10-CM | POA: Diagnosis not present

## 2018-01-28 DIAGNOSIS — N400 Enlarged prostate without lower urinary tract symptoms: Secondary | ICD-10-CM | POA: Diagnosis present

## 2018-01-28 DIAGNOSIS — I5032 Chronic diastolic (congestive) heart failure: Secondary | ICD-10-CM | POA: Diagnosis present

## 2018-01-28 DIAGNOSIS — R52 Pain, unspecified: Secondary | ICD-10-CM | POA: Diagnosis not present

## 2018-01-28 DIAGNOSIS — Z7401 Bed confinement status: Secondary | ICD-10-CM | POA: Diagnosis not present

## 2018-01-28 DIAGNOSIS — I480 Paroxysmal atrial fibrillation: Secondary | ICD-10-CM | POA: Diagnosis present

## 2018-01-28 DIAGNOSIS — E669 Obesity, unspecified: Secondary | ICD-10-CM | POA: Diagnosis present

## 2018-01-28 DIAGNOSIS — M2550 Pain in unspecified joint: Secondary | ICD-10-CM | POA: Diagnosis not present

## 2018-01-28 DIAGNOSIS — R140 Abdominal distension (gaseous): Secondary | ICD-10-CM | POA: Diagnosis not present

## 2018-01-28 DIAGNOSIS — R0689 Other abnormalities of breathing: Secondary | ICD-10-CM | POA: Diagnosis not present

## 2018-01-28 DIAGNOSIS — K5900 Constipation, unspecified: Secondary | ICD-10-CM | POA: Diagnosis not present

## 2018-01-28 DIAGNOSIS — I5033 Acute on chronic diastolic (congestive) heart failure: Secondary | ICD-10-CM | POA: Diagnosis not present

## 2018-01-28 DIAGNOSIS — R5383 Other fatigue: Secondary | ICD-10-CM | POA: Diagnosis not present

## 2018-01-28 DIAGNOSIS — T501X5A Adverse effect of loop [high-ceiling] diuretics, initial encounter: Secondary | ICD-10-CM | POA: Diagnosis not present

## 2018-01-28 DIAGNOSIS — J9811 Atelectasis: Secondary | ICD-10-CM | POA: Diagnosis not present

## 2018-01-28 DIAGNOSIS — E538 Deficiency of other specified B group vitamins: Secondary | ICD-10-CM | POA: Diagnosis present

## 2018-01-28 DIAGNOSIS — Z8546 Personal history of malignant neoplasm of prostate: Secondary | ICD-10-CM | POA: Diagnosis not present

## 2018-01-28 DIAGNOSIS — R740 Nonspecific elevation of levels of transaminase and lactic acid dehydrogenase [LDH]: Secondary | ICD-10-CM | POA: Diagnosis present

## 2018-01-28 DIAGNOSIS — J690 Pneumonitis due to inhalation of food and vomit: Secondary | ICD-10-CM | POA: Diagnosis present

## 2018-01-28 DIAGNOSIS — Z794 Long term (current) use of insulin: Secondary | ICD-10-CM | POA: Diagnosis not present

## 2018-01-28 DIAGNOSIS — E1142 Type 2 diabetes mellitus with diabetic polyneuropathy: Secondary | ICD-10-CM | POA: Diagnosis present

## 2018-01-28 DIAGNOSIS — E119 Type 2 diabetes mellitus without complications: Secondary | ICD-10-CM | POA: Diagnosis not present

## 2018-01-28 DIAGNOSIS — D649 Anemia, unspecified: Secondary | ICD-10-CM | POA: Diagnosis not present

## 2018-01-28 DIAGNOSIS — G629 Polyneuropathy, unspecified: Secondary | ICD-10-CM | POA: Diagnosis not present

## 2018-01-28 DIAGNOSIS — R000 Tachycardia, unspecified: Secondary | ICD-10-CM | POA: Diagnosis not present

## 2018-01-28 DIAGNOSIS — F419 Anxiety disorder, unspecified: Secondary | ICD-10-CM | POA: Diagnosis present

## 2018-01-28 DIAGNOSIS — N179 Acute kidney failure, unspecified: Secondary | ICD-10-CM | POA: Diagnosis not present

## 2018-01-28 DIAGNOSIS — R531 Weakness: Secondary | ICD-10-CM | POA: Diagnosis not present

## 2018-01-28 DIAGNOSIS — I639 Cerebral infarction, unspecified: Secondary | ICD-10-CM | POA: Diagnosis not present

## 2018-01-28 DIAGNOSIS — R0603 Acute respiratory distress: Secondary | ICD-10-CM | POA: Diagnosis not present

## 2018-01-28 DIAGNOSIS — E1165 Type 2 diabetes mellitus with hyperglycemia: Secondary | ICD-10-CM | POA: Diagnosis present

## 2018-01-28 DIAGNOSIS — Z8673 Personal history of transient ischemic attack (TIA), and cerebral infarction without residual deficits: Secondary | ICD-10-CM | POA: Diagnosis not present

## 2018-01-28 DIAGNOSIS — Z6833 Body mass index (BMI) 33.0-33.9, adult: Secondary | ICD-10-CM | POA: Diagnosis not present

## 2018-01-28 DIAGNOSIS — I509 Heart failure, unspecified: Secondary | ICD-10-CM | POA: Diagnosis not present

## 2018-01-28 DIAGNOSIS — R0902 Hypoxemia: Secondary | ICD-10-CM | POA: Diagnosis not present

## 2018-01-28 DIAGNOSIS — L89610 Pressure ulcer of right heel, unstageable: Secondary | ICD-10-CM | POA: Diagnosis present

## 2018-01-28 DIAGNOSIS — H409 Unspecified glaucoma: Secondary | ICD-10-CM | POA: Diagnosis present

## 2018-01-28 DIAGNOSIS — R296 Repeated falls: Secondary | ICD-10-CM | POA: Diagnosis not present

## 2018-01-28 DIAGNOSIS — J9601 Acute respiratory failure with hypoxia: Secondary | ICD-10-CM | POA: Diagnosis present

## 2018-01-28 DIAGNOSIS — L89890 Pressure ulcer of other site, unstageable: Secondary | ICD-10-CM | POA: Diagnosis present

## 2018-01-28 DIAGNOSIS — R627 Adult failure to thrive: Secondary | ICD-10-CM | POA: Diagnosis present

## 2018-01-28 DIAGNOSIS — I4819 Other persistent atrial fibrillation: Secondary | ICD-10-CM | POA: Diagnosis not present

## 2018-01-28 DIAGNOSIS — Z5181 Encounter for therapeutic drug level monitoring: Secondary | ICD-10-CM | POA: Diagnosis not present

## 2018-01-28 DIAGNOSIS — R0602 Shortness of breath: Secondary | ICD-10-CM | POA: Diagnosis not present

## 2018-01-28 DIAGNOSIS — Z66 Do not resuscitate: Secondary | ICD-10-CM | POA: Diagnosis present

## 2018-01-28 DIAGNOSIS — N289 Disorder of kidney and ureter, unspecified: Secondary | ICD-10-CM | POA: Diagnosis not present

## 2018-01-28 DIAGNOSIS — E785 Hyperlipidemia, unspecified: Secondary | ICD-10-CM | POA: Diagnosis present

## 2018-01-28 LAB — GLUCOSE, CAPILLARY
GLUCOSE-CAPILLARY: 134 mg/dL — AB (ref 70–99)
Glucose-Capillary: 160 mg/dL — ABNORMAL HIGH (ref 70–99)
Glucose-Capillary: 166 mg/dL — ABNORMAL HIGH (ref 70–99)

## 2018-01-28 LAB — BASIC METABOLIC PANEL
ANION GAP: 11 (ref 5–15)
BUN: 50 mg/dL — ABNORMAL HIGH (ref 8–23)
CO2: 29 mmol/L (ref 22–32)
Calcium: 8.9 mg/dL (ref 8.9–10.3)
Chloride: 99 mmol/L (ref 98–111)
Creatinine, Ser: 1.26 mg/dL — ABNORMAL HIGH (ref 0.61–1.24)
GFR calc Af Amer: >60 mL/min (ref >60)
GFR calc non Af Amer: 52 mL/min — ABNORMAL LOW (ref >60)
GLUCOSE: 126 mg/dL — AB (ref 70–99)
POTASSIUM: 3.8 mmol/L (ref 3.5–5.1)
Sodium: 139 mmol/L (ref 135–145)

## 2018-01-28 LAB — CBC
HCT: 41.8 % (ref 39.0–52.0)
Hemoglobin: 13.4 g/dL (ref 13.0–17.0)
MCH: 28.6 pg (ref 26.0–34.0)
MCHC: 32.1 g/dL (ref 30.0–36.0)
MCV: 89.1 fL (ref 80.0–100.0)
Platelets: 286 10*3/uL (ref 150–400)
RBC: 4.69 MIL/uL (ref 4.22–5.81)
RDW: 13.4 % (ref 11.5–15.5)
WBC: 7.8 10*3/uL (ref 4.0–10.5)
nRBC: 0 % (ref 0.0–0.2)

## 2018-01-28 LAB — BRAIN NATRIURETIC PEPTIDE: B Natriuretic Peptide: 180.4 pg/mL — ABNORMAL HIGH (ref 0.0–100.0)

## 2018-01-28 LAB — MAGNESIUM: Magnesium: 2.1 mg/dL (ref 1.7–2.4)

## 2018-01-28 MED ORDER — METOPROLOL TARTRATE 25 MG PO TABS: 12.5000 mg | ORAL_TABLET | Freq: Two times a day (BID) | ORAL | 0 refills | Status: DC

## 2018-01-28 MED ORDER — INSULIN GLARGINE 100 UNIT/ML ~~LOC~~ SOLN: 28.0000 [IU] | Freq: Every day | SUBCUTANEOUS | 0 refills | Status: DC

## 2018-01-28 MED ORDER — INSULIN ASPART 100 UNIT/ML ~~LOC~~ SOLN: 3.0000 [IU] | Freq: Three times a day (TID) | SUBCUTANEOUS | 0 refills | Status: AC

## 2018-01-28 NOTE — Clinical Social Work Note (Signed)
CSW facilitated patient discharge including contacting patient family and facility to confirm patient discharge plans. Clinical information faxed to facility and family agreeable with plan. CSW arranged ambulance transport via [facility] to [facility]. RN to call report prior to discharge [phone] Room 712A).  CSW will sign off for now as social work intervention is no longer needed. Please consult [place] again if new needs arise.  Dayton Scrape, [place]

## 2018-01-28 NOTE — Progress Notes (Signed)
Called and gave report to [employer] at [facility].   AVS and Prescription to be sent in folder with transport ([facility]).   CSW has called and notified family.    IV removed.

## 2018-01-28 NOTE — Clinical Social Work Placement (Signed)
   CLINICAL SOCIAL WORK PLACEMENT  NOTE  Date:  01/28/2018  Patient Details  Name: IHOR MEINZER MRN: [id number] Date of Birth: 15-Sep-1932  Clinical Social Work is seeking post-discharge placement for this patient at the   level of care (*CSW will initial, date and re-position this form in  chart as items are completed):      Patient/family provided with [place] Work Department's list of facilities offering this level of care within the geographic area requested by the patient (or if unable, by the patient's family).      Patient/family informed of their freedom to choose among providers that offer the needed level of care, that participate in Medicare, Medicaid or managed care program needed by the patient, have an available bed and are willing to accept the patient.      Patient/family informed of [facility]'s ownership interest in [facility] and [facility], as well as of the fact that they are under no obligation to receive care at these facilities.  PASRR submitted to EDS on       PASRR number received on       Existing PASRR number confirmed on       FL2 transmitted to all facilities in geographic area requested by pt/family on       FL2 transmitted to all facilities within larger geographic area on       Patient informed that his/her managed care company has contracts with or will negotiate with certain facilities, including the following:        Yes   Patient/family informed of bed offers received.  Patient chooses bed at [place], [place]     Physician recommends and patient chooses bed at      Patient to be transferred to [place] on 01/28/18.  Patient to be transferred to facility by [facility]     Patient family notified on 01/28/18 of transfer.  Name of family member notified:  Laury Deep.     PHYSICIAN Please prepare prescriptions     Additional Comment:    _______________________________________________ Candie Chroman, LCSW 01/28/2018, 1:00 PM

## 2018-01-28 NOTE — Clinical Social Work Note (Signed)
Per RN, patient will likely discharge today. [facility] is able to accept him.  Dayton Scrape, [place]

## 2018-01-28 NOTE — Discharge Summary (Signed)
Discharge Summary  Charles Friedman CNO:[id number] DOB: 06-05-1932  PCP: Haywood Pao, MD  Admit date: 01/24/2018 Discharge date: 01/28/2018  Time spent: 35 minutes  Recommendations for Outpatient Follow-up:  1. Follow-up with your PCP within a week 2. Follow-up with your cardiologist within a week 3. Continue physical therapy 4. Fall precautions  Discharge Diagnoses:  Active Hospital Problems   Diagnosis Date Noted  . Pressure injury of skin 01/27/2018  . Acute decompensated heart failure ([place]) 01/24/2018  . Acute on chronic diastolic CHF (congestive heart failure), NYHA class 2 ([place]) 04/10/2017  . History of CVA (cerebrovascular accident) 11/26/2015  . Type 2 diabetes mellitus with hyperglycemia ([place]) 07/15/2014  . Persistent atrial fibrillation     Resolved Hospital Problems  No resolved problems to display.    Discharge Condition: Stable  Diet recommendation: Resume previous diet  Vitals:   01/28/18 0905 01/28/18 1237  BP: (!) 111/49 (!) 105/56  Pulse: 94 (!) 51  Resp:  19  Temp:  98.7 F (37.1 C)  SpO2: 92% 96%    History of present illness:  84 y.o.malewithhx of afib (on xarelto), hx of CVA, DMT2, peripheral neuropathy, hx of prostate CA, and diastolic CHF with borderline EF 50-55%. Was admitted for ADHF in September with similar sx and had clinically improved with diuresis. Since then, patient has been able to ambulate and has been nearly independent with his ADLs. About 2-3 weeks ago, family reports that PCP had stopped metolazone but continued lasix due to concern for overdiuresis. Family has not noticed significant lower extremity but reports patient has been becoming increasingly dyspneic and fatigued with minimal exertion. Last weight about a week ago was around 235 lbs (not significantly changed from Nov but his dry weight from Sep was 218 lbs). Have not been able to weight him daily due to leg weakness. Patient reports that he feels short of breath. PCP  resumed metolazone last evening/day prior to admission, and patient reports marked increase in urine output today. Home PT eval also noted O2 sat 92-93% (which is lower than baseline). Thus, given patient's persistent dyspnea and weakness, family opted to bring to [facility] ED.  Hospital course complicated by persistent generalized weakness.  PT assessed and recommended SNF for short-term rehab.  01/28/2018: Patient seen and examined at his bedside.  No acute events overnight.  On the day of discharge, the patient was hemodynamically stable.  He will need to follow-up with his primary care provider and cardiologist post hospitalization.  Hospital Course:  Active Problems:   Type 2 diabetes mellitus with hyperglycemia (HCC)   Persistent atrial fibrillation   History of CVA (cerebrovascular accident)   Acute on chronic diastolic CHF (congestive heart failure), NYHA class 2 (H[facility])   Acute decompensated heart failure (H[facility])   Pressure injury of skin  Acute on chronic diastolic CHF Independently reviewed chest x-ray done on 01/27/2018 which revealed cardiomegaly with increase in pulmonary vascularity suggestive of pulmonary edema Last 2D echo done on 10/2017 revealed normal LVEF Continue cardiac medications Continue strict I's and O's and daily weight  Peripheral neuropathy and physical debility Continue home medications Continue PT OT at SNF Fall precautions  Chronic A. fib on metoprolol and Xarelto for CVA prophylaxis Continue metoprolol 12.5 mg twice daily for rate control and Xarelto for CVA prophylaxis Reduced dose of metoprolol due to hypotension and bradycardia  Type 2 diabetes complicated by hyperglycemia, improving Continue Lantus and NovoLog Avoid hypoglycemia  Chronic depression/anxiety Continue escitalopram  BPH Continue tamsulosin  DVT prophylaxis:xarelto  Code Status:Full   Consultants:  None  Procedures:  None   Discharge Exam: BP (!) 105/56 (BP  Location: Right Arm)   Pulse (!) 51   Temp 98.7 F (37.1 C) (Oral)   Resp 19   Ht 6\' 1"  (1.854 m)   Wt 110.4 kg   SpO2 96%   BMI 32.11 kg/m  . General: 84 y.o. year-old male well developed well nourished in no acute distress.  Alert and interactive. . Cardiovascular: Regular rate and rhythm with no rubs or gallops.  No thyromegaly or JVD noted.   Marland Kitchen Respiratory: Clear to auscultation with no wheezes or rales. Good inspiratory effort. . Abdomen: Soft nontender nondistended with normal bowel sounds x4 quadrants. . Musculoskeletal: No lower extremity edema. 2/4 pulses in all 4 extremities. Marland Kitchen Psychiatry: Mood is appropriate for condition and setting  Discharge Instructions You were cared for by a hospitalist during your hospital stay. If you have any questions about your discharge medications or the care you received while you were in the hospital after you are discharged, you can call the unit and asked to speak with the hospitalist on call if the hospitalist that took care of you is not available. Once you are discharged, your primary care physician will handle any further medical issues. Please note that NO REFILLS for any discharge medications will be authorized once you are discharged, as it is imperative that you return to your primary care physician (or establish a relationship with a primary care physician if you do not have one) for your aftercare needs so that they can reassess your need for medications and monitor your lab values.   Allergies as of 01/28/2018      Reactions   Aureomycin [chlortetracycline] Itching, Rash   Reaction approximately 1954      Medication List    STOP taking these medications   acetaminophen 325 MG tablet Commonly known as:  TYLENOL   HUMALOG KWIKPEN 100 UNIT/ML KwikPen Generic drug:  insulin lispro   insulin glargine 100 unit/mL Sopn Commonly known as:  LANTUS Replaced by:  insulin glargine 100 UNIT/ML injection   insulin lispro 100 UNIT/ML  injection Commonly known as:  HUMALOG   metolazone 2.5 MG tablet Commonly known as:  ZAROXOLYN     TAKE these medications   atorvastatin 40 MG tablet Commonly known as:  LIPITOR Take 1 tablet (40 mg total) by mouth daily at 6 PM. What changed:  when to take this   escitalopram 5 MG tablet Commonly known as:  LEXAPRO Take 5 mg by mouth daily.   furosemide 40 MG tablet Commonly known as:  LASIX Take 1 tablet (40 mg total) by mouth 2 (two) times daily. MAY TAKE AN EXTRA 40 MG IN THE MORNING IF WEIGHT IS ABOVE 3 LBS. What changed:    when to take this  additional instructions   insulin aspart 100 UNIT/ML injection Commonly known as:  novoLOG Inject 3 Units into the skin 3 (three) times daily with meals.   insulin glargine 100 UNIT/ML injection Commonly known as:  LANTUS Inject 0.28 mLs (28 Units total) into the skin daily before breakfast. Start taking on:  January 29, 2018 Replaces:  insulin glargine 100 unit/mL Sopn   metoprolol tartrate 25 MG tablet Commonly known as:  LOPRESSOR Take 0.5 tablets (12.5 mg total) by mouth 2 (two) times daily. What changed:  how much to take   nitroGLYCERIN 0.4 MG SL tablet Commonly known as:  NITROSTAT Place 1  tablet (0.4 mg total) under the tongue every 5 (five) minutes as needed for chest pain.   pregabalin 75 MG capsule Commonly known as:  LYRICA Take 75 mg by mouth 2 (two) times daily.   rivaroxaban 20 MG Tabs tablet Commonly known as:  XARELTO Take 1 tablet (20 mg total) by mouth daily with supper.   tamsulosin 0.4 MG Caps capsule Commonly known as:  FLOMAX Take 0.4 mg by mouth daily.      Allergies  Allergen Reactions  . Aureomycin [Chlortetracycline] Itching and Rash    Reaction approximately 1954    Contact information for follow-up providers    Tisovec, Fransico Him, MD. Call in 1 day(s).   Specialty:  Internal Medicine Why:  Please call for a post hospital follow-up appointment. Contact information: [place] [address] [phone]        Leonie Man, MD .   Specialty:  Cardiology Contact information: [address] [place] [address] [phone]            Contact information for after-discharge care    Destination    [facility] Preferred SNF .   Service:  Skilled Nursing Contact information: [place] [phone]                   The results of significant diagnostics from this hospitalization (including imaging, microbiology, ancillary and laboratory) are listed below for reference.    Significant Diagnostic Studies: Ct Head Wo Contrast  Result Date: 01/24/2018 CLINICAL DATA:  Altered mental status. EXAM: CT HEAD WITHOUT CONTRAST TECHNIQUE: Contiguous axial images were obtained from the base of the skull through the vertex without intravenous contrast. COMPARISON:  10/20/2017 FINDINGS: Brain: There is no evidence for acute hemorrhage, hydrocephalus, mass lesion, or abnormal extra-axial fluid collection. No definite CT evidence for acute infarction. Diffuse loss of parenchymal volume is consistent with atrophy. Patchy low attenuation in the deep hemispheric and periventricular white matter is nonspecific, but likely reflects chronic microvascular ischemic demyelination. Vascular: No hyperdense vessel or unexpected calcification. Skull: No evidence for fracture. No worrisome lytic or sclerotic lesion. Sinuses/Orbits: The visualized paranasal sinuses and mastoid air cells are clear. Visualized portions of the globes and intraorbital fat are unremarkable. Other: None. IMPRESSION: 1. Stable.  No acute intracranial abnormality. 2. Atrophy with chronic small vessel white matter ischemic disease. Electronically Signed   By: Misty Stanley M.D.   On: 01/24/2018 17:51   Dg Chest Port 1 View  Result Date: 01/27/2018 CLINICAL DATA:  Shortness of breath on exertion EXAM: PORTABLE  CHEST 1 VIEW COMPARISON:  01/24/2018 FINDINGS: Cardiomegaly with vascular congestion. Increasing interstitial prominence in the lungs could reflect interstitial edema. Bibasilar atelectasis. No visible effusions or acute bony abnormality. IMPRESSION: Cardiomegaly with vascular congestion and interstitial prominence, likely interstitial edema. Bibasilar atelectasis. Electronically Signed   By: Rolm Baptise M.D.   On: 01/27/2018 09:44   Dg Chest Port 1 View  Result Date: 01/24/2018 CLINICAL DATA:  Shortness of breath and weakness. Altered mental status. EXAM: PORTABLE CHEST 1 VIEW COMPARISON:  10/21/2017 FINDINGS: The heart is borderline enlarged but stable. There is tortuosity and calcification of the thoracic aorta. Chronic bronchitic type lung changes but no infiltrates or effusions. IMPRESSION: Mild stable cardiac enlargement but no acute pulmonary findings. Electronically Signed   By: Marijo Sanes M.D.   On: 01/24/2018 17:35    Microbiology: Recent Results (from the past 240 hour(s))  Culture,  blood (routine x 2)     Status: None (Preliminary result)   Collection Time: 01/24/18  5:12 PM  Result Value Ref Range Status   Specimen Description BLOOD BLOOD RIGHT FOREARM  Final   Special Requests   Final    BOTTLES DRAWN AEROBIC ONLY Blood Culture adequate volume   Culture   Final    NO GROWTH 3 DAYS Performed at [facility], [address] [place], [place] [address]    Report Status PENDING  Incomplete  Culture, blood (routine x 2)     Status: None (Preliminary result)   Collection Time: 01/24/18  5:12 PM  Result Value Ref Range Status   Specimen Description BLOOD BLOOD RIGHT WRIST  Final   Special Requests   Final    BOTTLES DRAWN AEROBIC AND ANAEROBIC Blood Culture adequate volume   Culture   Final    NO GROWTH 3 DAYS Performed at [facility], [address], [place], [place] [address]    Report Status PENDING  Incomplete  Urine culture     Status: None   Collection  Time: 01/24/18  5:12 PM  Result Value Ref Range Status   Specimen Description URINE, RANDOM  Final   Special Requests NONE  Final   Culture   Final    NO GROWTH Performed at [facility], [address], [place], [place] [address]    Report Status 01/25/2018 FINAL  Final     Labs: Basic Metabolic Panel: Recent Labs  Lab 01/24/18 1712 01/25/18 0546 01/26/18 0454 01/27/18 0526 01/28/18 0415  NA 139 141 138 138 139  K 3.8 3.2* 3.7 3.7 3.8  CL 98 97* 98 100 99  CO2 28 33* 27 29 29   GLUCOSE 147* 65* 157* 127* 126*  BUN 45* 44* 53* 52* 50*  CREATININE 1.08 1.25* 1.39* 1.22 1.26*  CALCIUM 8.8* 8.7* 8.6* 8.7* 8.9  MG  --  1.8 2.0 2.1 2.1   Liver Function Tests: Recent Labs  Lab 01/24/18 1712 01/27/18 0526  AST 139* 122*  ALT 100* 89*  ALKPHOS 123 102  BILITOT 1.0 1.1  PROT 6.9 6.2*  ALBUMIN 2.8* 2.5*   Recent Labs  Lab 01/24/18 1712  LIPASE 30   No results for input(s): AMMONIA in the last 168 hours. CBC: Recent Labs  Lab 01/24/18 1712 01/25/18 0546 01/26/18 0454 01/27/18 0526 01/28/18 0415  WBC 8.9 9.6 8.2 8.4 7.8  NEUTROABS 5.9  --   --   --   --   HGB 14.1 13.4 13.3 13.1 13.4  HCT 44.0 42.1 41.0 41.1 41.8  MCV 90.5 89.6 88.9 89.5 89.1  PLT 266 289 247 274 286   Cardiac Enzymes: No results for input(s): CKTOTAL, CKMB, CKMBINDEX, TROPONINI in the last 168 hours. BNP: BNP (last 3 results) Recent Labs    10/20/17 0037 01/24/18 1712 01/28/18 0415  BNP 268.2* 189.2* 180.4*    ProBNP (last 3 results) No results for input(s): PROBNP in the last 8760 hours.  CBG: Recent Labs  Lab 01/27/18 0723 01/27/18 1147 01/27/18 1629 01/27/18 2122 01/28/18 0751  GLUCAP 119* 129* 132* 160* 134*       Signed:  Kayleen Memos, MD [facility] 01/28/2018, 12:44 PM

## 2018-01-29 ENCOUNTER — Other Ambulatory Visit: Payer: Medicare Other

## 2018-01-29 DIAGNOSIS — I639 Cerebral infarction, unspecified: Secondary | ICD-10-CM | POA: Diagnosis not present

## 2018-01-29 DIAGNOSIS — I509 Heart failure, unspecified: Secondary | ICD-10-CM | POA: Diagnosis not present

## 2018-01-29 DIAGNOSIS — E119 Type 2 diabetes mellitus without complications: Secondary | ICD-10-CM | POA: Diagnosis not present

## 2018-01-29 DIAGNOSIS — R5383 Other fatigue: Secondary | ICD-10-CM | POA: Diagnosis not present

## 2018-01-29 LAB — CULTURE, BLOOD (ROUTINE X 2)
CULTURE: NO GROWTH
Culture: NO GROWTH
Special Requests: ADEQUATE
Special Requests: ADEQUATE

## 2018-01-31 ENCOUNTER — Emergency Department (HOSPITAL_COMMUNITY): Payer: Medicare Other

## 2018-01-31 ENCOUNTER — Other Ambulatory Visit: Payer: Self-pay

## 2018-01-31 ENCOUNTER — Inpatient Hospital Stay (HOSPITAL_COMMUNITY)
Admission: EM | Admit: 2018-01-31 | Discharge: 2018-02-07 | DRG: 177 | Disposition: A | Discharge status: Skilled Nursing Facility | Payer: Medicare Other | Attending: Internal Medicine | Admitting: Internal Medicine

## 2018-01-31 ENCOUNTER — Encounter (HOSPITAL_COMMUNITY): Payer: Self-pay

## 2018-01-31 DIAGNOSIS — J690 Pneumonitis due to inhalation of food and vomit: Secondary | ICD-10-CM | POA: Diagnosis not present

## 2018-01-31 DIAGNOSIS — Z8546 Personal history of malignant neoplasm of prostate: Secondary | ICD-10-CM

## 2018-01-31 DIAGNOSIS — E785 Hyperlipidemia, unspecified: Secondary | ICD-10-CM | POA: Diagnosis present

## 2018-01-31 DIAGNOSIS — Z881 Allergy status to other antibiotic agents status: Secondary | ICD-10-CM

## 2018-01-31 DIAGNOSIS — R000 Tachycardia, unspecified: Secondary | ICD-10-CM | POA: Diagnosis not present

## 2018-01-31 DIAGNOSIS — R0602 Shortness of breath: Secondary | ICD-10-CM | POA: Diagnosis not present

## 2018-01-31 DIAGNOSIS — N179 Acute kidney failure, unspecified: Secondary | ICD-10-CM | POA: Diagnosis not present

## 2018-01-31 DIAGNOSIS — R112 Nausea with vomiting, unspecified: Secondary | ICD-10-CM

## 2018-01-31 DIAGNOSIS — E669 Obesity, unspecified: Secondary | ICD-10-CM | POA: Diagnosis present

## 2018-01-31 DIAGNOSIS — R0603 Acute respiratory distress: Secondary | ICD-10-CM | POA: Diagnosis present

## 2018-01-31 DIAGNOSIS — N400 Enlarged prostate without lower urinary tract symptoms: Secondary | ICD-10-CM | POA: Diagnosis present

## 2018-01-31 DIAGNOSIS — I5032 Chronic diastolic (congestive) heart failure: Secondary | ICD-10-CM | POA: Diagnosis present

## 2018-01-31 DIAGNOSIS — J9601 Acute respiratory failure with hypoxia: Secondary | ICD-10-CM | POA: Diagnosis not present

## 2018-01-31 DIAGNOSIS — K5900 Constipation, unspecified: Secondary | ICD-10-CM | POA: Diagnosis not present

## 2018-01-31 DIAGNOSIS — T501X5A Adverse effect of loop [high-ceiling] diuretics, initial encounter: Secondary | ICD-10-CM | POA: Diagnosis not present

## 2018-01-31 DIAGNOSIS — R627 Adult failure to thrive: Secondary | ICD-10-CM | POA: Diagnosis present

## 2018-01-31 DIAGNOSIS — R740 Nonspecific elevation of levels of transaminase and lactic acid dehydrogenase [LDH]: Secondary | ICD-10-CM | POA: Diagnosis present

## 2018-01-31 DIAGNOSIS — Z6833 Body mass index (BMI) 33.0-33.9, adult: Secondary | ICD-10-CM

## 2018-01-31 DIAGNOSIS — E1165 Type 2 diabetes mellitus with hyperglycemia: Secondary | ICD-10-CM | POA: Diagnosis present

## 2018-01-31 DIAGNOSIS — Z794 Long term (current) use of insulin: Secondary | ICD-10-CM

## 2018-01-31 DIAGNOSIS — H409 Unspecified glaucoma: Secondary | ICD-10-CM | POA: Diagnosis present

## 2018-01-31 DIAGNOSIS — I480 Paroxysmal atrial fibrillation: Secondary | ICD-10-CM | POA: Diagnosis present

## 2018-01-31 DIAGNOSIS — Z8673 Personal history of transient ischemic attack (TIA), and cerebral infarction without residual deficits: Secondary | ICD-10-CM

## 2018-01-31 DIAGNOSIS — R531 Weakness: Secondary | ICD-10-CM

## 2018-01-31 DIAGNOSIS — E1142 Type 2 diabetes mellitus with diabetic polyneuropathy: Secondary | ICD-10-CM | POA: Diagnosis present

## 2018-01-31 DIAGNOSIS — R7401 Elevation of levels of liver transaminase levels: Secondary | ICD-10-CM

## 2018-01-31 DIAGNOSIS — Z923 Personal history of irradiation: Secondary | ICD-10-CM

## 2018-01-31 DIAGNOSIS — Z79899 Other long term (current) drug therapy: Secondary | ICD-10-CM

## 2018-01-31 DIAGNOSIS — L89890 Pressure ulcer of other site, unstageable: Secondary | ICD-10-CM | POA: Diagnosis present

## 2018-01-31 DIAGNOSIS — F419 Anxiety disorder, unspecified: Secondary | ICD-10-CM | POA: Diagnosis present

## 2018-01-31 DIAGNOSIS — Z7901 Long term (current) use of anticoagulants: Secondary | ICD-10-CM

## 2018-01-31 DIAGNOSIS — E538 Deficiency of other specified B group vitamins: Secondary | ICD-10-CM | POA: Diagnosis present

## 2018-01-31 DIAGNOSIS — L89610 Pressure ulcer of right heel, unstageable: Secondary | ICD-10-CM | POA: Diagnosis present

## 2018-01-31 DIAGNOSIS — Z66 Do not resuscitate: Secondary | ICD-10-CM | POA: Diagnosis present

## 2018-01-31 DIAGNOSIS — J9811 Atelectasis: Secondary | ICD-10-CM | POA: Diagnosis not present

## 2018-01-31 LAB — CBC WITH DIFFERENTIAL/PLATELET
Abs Immature Granulocytes: 0.03 10*3/uL (ref 0.00–0.07)
Basophils Absolute: 0 10*3/uL (ref 0.0–0.1)
Basophils Relative: 0 %
Eosinophils Absolute: 0.1 10*3/uL (ref 0.0–0.5)
Eosinophils Relative: 1 %
HCT: 45.4 % (ref 39.0–52.0)
Hemoglobin: 14.4 g/dL (ref 13.0–17.0)
IMMATURE GRANULOCYTES: 0 %
Lymphocytes Relative: 11 %
Lymphs Abs: 1.3 10*3/uL (ref 0.7–4.0)
MCH: 28.9 pg (ref 26.0–34.0)
MCHC: 31.7 g/dL (ref 30.0–36.0)
MCV: 91 fL (ref 80.0–100.0)
Monocytes Absolute: 0.9 10*3/uL (ref 0.1–1.0)
Monocytes Relative: 7 %
Neutro Abs: 9.7 10*3/uL — ABNORMAL HIGH (ref 1.7–7.7)
Neutrophils Relative %: 81 %
Platelets: 293 10*3/uL (ref 150–400)
RBC: 4.99 MIL/uL (ref 4.22–5.81)
RDW: 13.3 % (ref 11.5–15.5)
WBC: 12.1 10*3/uL — ABNORMAL HIGH (ref 4.0–10.5)
nRBC: 0 % (ref 0.0–0.2)

## 2018-01-31 LAB — I-STAT CHEM 8, ED
BUN: 46 mg/dL — ABNORMAL HIGH (ref 8–23)
CHLORIDE: 99 mmol/L (ref 98–111)
Calcium, Ion: 1.13 mmol/L — ABNORMAL LOW (ref 1.15–1.40)
Creatinine, Ser: 1.1 mg/dL (ref 0.61–1.24)
Glucose, Bld: 176 mg/dL — ABNORMAL HIGH (ref 70–99)
HCT: 45 % (ref 39.0–52.0)
Hemoglobin: 15.3 g/dL (ref 13.0–17.0)
Potassium: 4.1 mmol/L (ref 3.5–5.1)
Sodium: 140 mmol/L (ref 135–145)
TCO2: 32 mmol/L (ref 22–32)

## 2018-01-31 LAB — I-STAT VENOUS BLOOD GAS, ED
ACID-BASE EXCESS: 10 mmol/L — AB (ref 0.0–2.0)
Bicarbonate: 36.1 mmol/L — ABNORMAL HIGH (ref 20.0–28.0)
O2 Saturation: 82 %
TCO2: 38 mmol/L — ABNORMAL HIGH (ref 22–32)
pCO2, Ven: 53.8 mmHg (ref 44.0–60.0)
pH, Ven: 7.435 — ABNORMAL HIGH (ref 7.250–7.430)
pO2, Ven: 46 mmHg — ABNORMAL HIGH (ref 32.0–45.0)

## 2018-01-31 LAB — COMPREHENSIVE METABOLIC PANEL
ALK PHOS: 144 U/L — AB (ref 38–126)
ALT: 101 U/L — AB (ref 0–44)
ANION GAP: 7 (ref 5–15)
AST: 120 U/L — ABNORMAL HIGH (ref 15–41)
Albumin: 2.8 g/dL — ABNORMAL LOW (ref 3.5–5.0)
BUN: 47 mg/dL — ABNORMAL HIGH (ref 8–23)
CALCIUM: 8.7 mg/dL — AB (ref 8.9–10.3)
CO2: 32 mmol/L (ref 22–32)
Chloride: 99 mmol/L (ref 98–111)
Creatinine, Ser: 1.11 mg/dL (ref 0.61–1.24)
GFR calc Af Amer: >60 mL/min (ref >60)
GFR calc non Af Amer: >60 mL/min (ref >60)
Glucose, Bld: 180 mg/dL — ABNORMAL HIGH (ref 70–99)
Potassium: 4.2 mmol/L (ref 3.5–5.1)
Sodium: 138 mmol/L (ref 135–145)
Total Bilirubin: 1 mg/dL (ref 0.3–1.2)
Total Protein: 7.4 g/dL (ref 6.5–8.1)

## 2018-01-31 LAB — AMMONIA: Ammonia: 15 umol/L (ref 9–35)

## 2018-01-31 LAB — PROTIME-INR
INR: 1.24
Prothrombin Time: 15.5 seconds — ABNORMAL HIGH (ref 11.4–15.2)

## 2018-01-31 LAB — I-STAT CG4 LACTIC ACID, ED: Lactic Acid, Venous: 1.55 mmol/L (ref 0.5–1.9)

## 2018-01-31 LAB — TSH: TSH: 1.828 u[IU]/mL (ref 0.350–4.500)

## 2018-01-31 LAB — BRAIN NATRIURETIC PEPTIDE: B Natriuretic Peptide: 144.1 pg/mL — ABNORMAL HIGH (ref 0.0–100.0)

## 2018-01-31 LAB — APTT: aPTT: 28 seconds (ref 24–36)

## 2018-01-31 LAB — I-STAT TROPONIN, ED: Troponin i, poc: 0.03 ng/mL (ref 0.00–0.08)

## 2018-01-31 MED ORDER — IOPAMIDOL (ISOVUE-370) INJECTION 76%
100.0000 mL | Freq: Once | INTRAVENOUS | Status: AC | PRN
  Administered 2018-01-31: 100 mL via INTRAVENOUS

## 2018-01-31 MED ORDER — IOPAMIDOL (ISOVUE-370) INJECTION 76%
INTRAVENOUS | Status: AC
  Filled 2018-01-31: qty 100

## 2018-01-31 MED ORDER — METHYLPREDNISOLONE SODIUM SUCC 40 MG IJ SOLR
40.0000 mg | Freq: Four times a day (QID) | INTRAMUSCULAR | Status: DC
  Administered 2018-01-31: 40 mg via INTRAVENOUS
  Filled 2018-01-31: qty 1

## 2018-01-31 NOTE — ED Notes (Signed)
Pt returned from imaging.

## 2018-01-31 NOTE — ED Notes (Signed)
Patient transported to CT 

## 2018-01-31 NOTE — ED Triage Notes (Signed)
Per [facility], pt arrives from [facility] for SOB/diminished breathing and increased lethargy. Pt at baseline is alert and oriented x4. Pt can still answer questions, but is more lethargic. Pt recently started on 3L . Pt has hx of CHF and a fib. Pt reports pain to butt, known ulcer there prior to pt arrival.

## 2018-01-31 NOTE — ED Provider Notes (Signed)
[facility] EMERGENCY DEPARTMENT Provider Note   CSN: [id number] Arrival date & time: 01/31/18  1653     History   Chief Complaint No chief complaint on file.   HPI Charles Friedman is a 84 y.o. male.  84 yo M with a chief complaints of shortness of breath.  Going on for the past week per the patient.  Worsening over the past few days per the family.  Having episodes of confusion as well.  Patient was recently hospitalized for the same was discharged to a skilled nursing facility.  And since then he started to have significant skin breakdown and has had increased work of breathing.  Not normally on oxygen was started on 3 L oxygen by the facility.  Patient denies cough denies fever had been constipated and was given a suppository and has had multiple bowel movements.  Has a new sacral ulcer as well as ulcerations to the heels bilaterally.  Felt that his abdomen was slightly more distended than normal.  Denies dysuria increased frequency or hesitancy.  Family is worried mostly about the patient's liver, no history of liver dysfunction in the past.  They had talked with the facility and there is a scheduled ultrasound for tomorrow.  They stated that this is all been going on for the past 3 weeks that he was fully functional and lives by himself at home and has had a precipitous decline.  The history is provided by the patient.  Illness  This is a new problem. The current episode started more than 1 week ago. The problem occurs constantly. The problem has been gradually worsening. Associated symptoms include shortness of breath. Pertinent negatives include no chest pain, no abdominal pain and no headaches. Nothing aggravates the symptoms. Nothing relieves the symptoms. He has tried nothing for the symptoms. The treatment provided no relief.    Past Medical History:  Diagnosis Date  . Atrial fibrillation ([place]) 2003   Initially on Coumadin, now on Xarelto  . Cataract    Bilateral cataracts.  . Diabetes mellitus without complication ([place])    type II  . Diabetic peripheral neuropathy associated with type 2 diabetes mellitus ([place])   . Glaucoma    left eye  . Hx of radiation therapy 08/15/13- 10/06/13   prostate 7600 cGy 38 sessions  . Hyperlipidemia with target LDL less than 70   . Neuropathy    diabetic, in feet  . Personal history of fall 09/26/13   front entrance of [facility]  . Prostate cancer [facility])    History of radiation therapy    Patient Active Problem List   Diagnosis Date Noted  . Pressure injury of skin 01/27/2018  . Acute decompensated heart failure ([place]) 01/24/2018  . Renal insufficiency 10/20/2017  . Generalized weakness 10/20/2017  . Recurrent falls 10/20/2017  . Bilateral edema of lower extremity 04/10/2017  . Acute on chronic diastolic CHF (congestive heart failure), NYHA class 2 ([place]) 04/10/2017  . Hyperlipidemia with target LDL less than 100 01/22/2016  . Abnormality of gait 01/22/2016  . History of CVA (cerebrovascular accident) 11/26/2015  . TIA (transient ischemic attack) 11/25/2015  . Cerebral infarction ([place]) 11/25/2015  . Stroke (cerebrum) ([place]) 11/25/2015  . Type 2 diabetes mellitus with hyperglycemia ([place]) 07/15/2014  . UTI (lower urinary tract infection) 07/15/2014  . Fall at home 07/15/2014  . Closed rib fracture 07/15/2014  . Dehydration, mild 07/15/2014  . Diabetes mellitus without complication ([place])   . Hx of radiation therapy   .  Persistent atrial fibrillation   . Prostate cancer ([place]) 01/29/2012    Past Surgical History:  Procedure Laterality Date  . CAROTID DOPPLERS  11/2015   Mild plaque bilaterally.  Less than 40%.  Antegrade vertebral flow.  Marland Kitchen PROSTATE BIOPSY  12/25/11   Adenocarcinoma, gleason 6  . TRANSTHORACIC ECHOCARDIOGRAM  11/2015   In setting of CVA: Technically difficult.  Normal LV size and function.  EF 55-60%.  No regional wall motion normality.  Moderate LA dilation, mild RA dilation.  Mildly  increased PA pressures.  . TRANSTHORACIC ECHOCARDIOGRAM  10/20/2017   Low normal LV function.  EF 50 to 55%.  No R WMA.  Moderate LA dilation.  Mild to moderate RV dilation with mildly reduced function -however no significant tricuspid regurgitation noted.  Mild R RA dilation.          Home Medications    Prior to Admission medications   Medication Sig Start Date End Date Taking? Authorizing Provider  atorvastatin (LIPITOR) 40 MG tablet Take 1 tablet (40 mg total) by mouth daily at 6 PM. Patient taking differently: Take 40 mg by mouth daily after supper.  11/27/15  Yes Vann, Jessica U, DO  escitalopram (LEXAPRO) 5 MG tablet Take 5 mg by mouth daily.   Yes [provider]  furosemide (LASIX) 40 MG tablet Take 1 tablet (40 mg total) by mouth 2 (two) times daily. MAY TAKE AN EXTRA 40 MG IN THE MORNING IF WEIGHT IS ABOVE 3 LBS. Patient taking differently: Take 40 mg by mouth See admin instructions. Take one tablet (40 mg) by mouth twice daily, may take an extra 40 mg in the morning for weight gain >3 lbs over night 10/29/17  Yes Leonie Man, MD  insulin aspart (NOVOLOG) 100 UNIT/ML injection Inject 3 Units into the skin 3 (three) times daily with meals. 01/28/18  Yes Irene Pap N, DO  insulin glargine (LANTUS) 100 UNIT/ML injection Inject 0.28 mLs (28 Units total) into the skin daily before breakfast. 01/29/18  Yes Irene Pap N, DO  metoprolol tartrate (LOPRESSOR) 25 MG tablet Take 1 tablet (25 mg total) by mouth 2 (two) times daily. 10/29/17 01/31/18 Yes Leonie Man, MD  pregabalin (LYRICA) 75 MG capsule Take 75 mg by mouth 2 (two) times daily.   Yes [provider]  rivaroxaban (XARELTO) 20 MG TABS tablet Take 1 tablet (20 mg total) by mouth daily with supper. 11/27/15  Yes Eulogio Bear U, DO  tamsulosin (FLOMAX) 0.4 MG CAPS capsule Take 0.4 mg by mouth daily. 10/21/15  Yes [provider]  metoprolol tartrate (LOPRESSOR) 25 MG tablet Take 0.5 tablets (12.5  mg total) by mouth 2 (two) times daily. Patient not taking: Reported on 01/31/2018 01/28/18 04/28/18  Kayleen Memos, DO  nitroGLYCERIN (NITROSTAT) 0.4 MG SL tablet Place 1 tablet (0.4 mg total) under the tongue every 5 (five) minutes as needed for chest pain. Patient not taking: Reported on 01/31/2018 01/14/17 02/24/18  Leonie Man, MD    Family History Family History  Problem Relation Age of Onset  . Cancer Sister 48       brain tumor died at age 62  . Heart disease Mother        Marena Chancy of specifics  . Heart attack Father 74       sudden cardiac death    Social History Social History   Tobacco Use  . Smoking status: Never Smoker  . Smokeless tobacco: Never Used  Substance Use Topics  .  Alcohol use: No  . Drug use: No     Allergies   Aureomycin [chlortetracycline]   Review of Systems Review of Systems  Constitutional: Negative for chills and fever.  HENT: Negative for congestion and facial swelling.   Eyes: Negative for discharge and visual disturbance.  Respiratory: Positive for shortness of breath.   Cardiovascular: Negative for chest pain and palpitations.  Gastrointestinal: Negative for abdominal pain, diarrhea and vomiting.  Musculoskeletal: Negative for arthralgias and myalgias.  Skin: Negative for color change and rash.  Neurological: Negative for tremors, syncope and headaches.  Psychiatric/Behavioral: Negative for confusion and dysphoric mood.     Physical Exam Updated Vital Signs BP 111/64   Pulse 93   Temp 98.6 F (37 C) (Rectal)   Resp (!) 30   SpO2 97%   Physical Exam Vitals signs and nursing note reviewed.  Constitutional:      Appearance: He is well-developed.  HENT:     Head: Normocephalic and atraumatic.  Eyes:     Pupils: Pupils are equal, round, and reactive to light.  Neck:     Musculoskeletal: Normal range of motion and neck supple.     Vascular: No JVD.  Cardiovascular:     Rate and Rhythm: Normal rate and regular rhythm.       Heart sounds: No murmur. No friction rub. No gallop.   Pulmonary:     Effort: No respiratory distress.     Breath sounds: No wheezing.     Comments: Tachypnea, diminished breath sounds in all fields Abdominal:     General: There is no distension.     Tenderness: There is no abdominal tenderness. There is no guarding or rebound.  Musculoskeletal: Normal range of motion.     Comments: Punctate dark spots to right toes, left great toe with some erythema and bluish appearance. Intact pulses to bilateral lower extremities  Skin:    Coloration: Skin is not pale.     Findings: No rash.     Comments: Erosions to bilateral posterior heels.  He has an ulceration to bilateral aspects of the sacrum.  Superficial rest of the area is erythematous and blanching.  Neurological:     Mental Status: He is alert and oriented to person, place, and time.  Psychiatric:        Behavior: Behavior normal.      ED Treatments / Results  Labs (all labs ordered are listed, but only abnormal results are displayed) Labs Reviewed  CBC WITH DIFFERENTIAL/PLATELET - Abnormal; Notable for the following components:      Result Value   WBC 12.1 (*)    Neutro Abs 9.7 (*)    All other components within normal limits  COMPREHENSIVE METABOLIC PANEL - Abnormal; Notable for the following components:   Glucose, Bld 180 (*)    BUN 47 (*)    Calcium 8.7 (*)    Albumin 2.8 (*)    AST 120 (*)    ALT 101 (*)    Alkaline Phosphatase 144 (*)    All other components within normal limits  PROTIME-INR - Abnormal; Notable for the following components:   Prothrombin Time 15.5 (*)    All other components within normal limits  BRAIN NATRIURETIC PEPTIDE - Abnormal; Notable for the following components:   B Natriuretic Peptide 144.1 (*)    All other components within normal limits  I-STAT CHEM 8, ED - Abnormal; Notable for the following components:   BUN 46 (*)    Glucose, Bld 176 (*)  Calcium, Ion 1.13 (*)    All other  components within normal limits  I-STAT VENOUS BLOOD GAS, ED - Abnormal; Notable for the following components:   pH, Ven 7.435 (*)    pO2, Ven 46.0 (*)    Bicarbonate 36.1 (*)    TCO2 38 (*)    Acid-Base Excess 10.0 (*)    All other components within normal limits  CULTURE, BLOOD (ROUTINE X 2)  CULTURE, BLOOD (ROUTINE X 2)  AMMONIA  APTT  TSH  BLOOD GAS, VENOUS  URINALYSIS, ROUTINE W REFLEX MICROSCOPIC  T4  I-STAT TROPONIN, ED  I-STAT CG4 LACTIC ACID, ED  I-STAT CG4 LACTIC ACID, ED    EKG EKG Interpretation  Date/Time:  Sunday January 31 2018 17:01:53 EST Ventricular Rate:  100 PR Interval:    QRS Duration: 102 QT Interval:  358 QTC Calculation: 462 R Axis:   28 Text Interpretation:  Sinus tachycardia Repol abnrm suggests ischemia, lateral leads No significant change since last tracing Confirmed by Adriyana Greenbaum ([id number]) on 01/31/2018 5:05:09 PM   Radiology Ct Angio Chest Pe W And/or Wo Contrast  Result Date: 01/31/2018 CLINICAL DATA:  Shortness of breath EXAM: CT ANGIOGRAPHY CHEST WITH CONTRAST TECHNIQUE: Multidetector CT imaging of the chest was performed using the standard protocol during bolus administration of intravenous contrast. Multiplanar CT image reconstructions and MIPs were obtained to evaluate the vascular anatomy. CONTRAST:  100mL ISOVUE-370 IOPAMIDOL (ISOVUE-370) INJECTION 76% COMPARISON:  None. FINDINGS: Cardiovascular: Heart is mildly enlarged. Aorta is normal caliber. Moderate coronary artery calcifications and aortic calcifications. No filling defects in the pulmonary arteries to suggest pulmonary emboli. Mediastinum/Nodes: No mediastinal, hilar, or axillary adenopathy. Lungs/Pleura: Predominantly linear densities in both lower lobes most compatible with atelectasis. Some tree-in-bud ground-glass nodular densities are noted in the superior segment of the right lower lobe which could reflect early alveolitis/small airways disease. No effusions. Upper Abdomen:  Imaging into the upper abdomen shows no acute findings. Musculoskeletal: Chest wall soft tissues are unremarkable. No acute bony abnormality. Review of the MIP images confirms the above findings. IMPRESSION: No evidence of pulmonary embolus. Cardiomegaly, coronary artery disease. Bilateral lower lobe atelectasis. Tree-in-bud nodular densities in the superior segment of the right lower lobe could reflect small airways disease/alveolitis. Aortic Atherosclerosis (ICD10-I70.0). Electronically Signed   By: Kevin  Dover M.D.   On: 01/31/2018 18:56   Dg Chest Port 1 View  Result Date: 01/31/2018 CLINICAL DATA:  Shortness of breath EXAM: PORTABLE CHEST 1 VIEW COMPARISON:  01/27/2018 FINDINGS: There is improved aeration of the lungs. No pulmonary edema or focal consolidation. Unchanged mild cardiomegaly. No pleural effusion or pneumothorax. IMPRESSION: Improved aeration without focal airspace disease. Electronically Signed   By: Kevin  Herman M.D.   On: 01/31/2018 17:38    Procedures Procedures (including critical care time)  Medications Ordered in ED Medications  iopamidol (ISOVUE-370) 76 % injection (has no administration in time range)  methylPREDNISolone sodium succinate (SOLU-MEDROL) 40 mg/mL injection 40 mg (has no administration in time range)  iopamidol (ISOVUE-370) 76 % injection 100 mL (100 mLs Intravenous Contrast Given 01/31/18 1843)     Initial Impression / Assessment and Plan / ED Course  I have reviewed the triage vital signs and the nursing notes.  Pertinent labs & imaging results that were available during my care of the patient were reviewed by me and considered in my medical decision making (see chart for details).     85  yo M with a chief complaint of altered mental status.  Going on  for the past 3 weeks with progressive fatigue and shortness of breath.  Patient is on Xarelto for A. fib.  This makes likelihood of PE much less though the patient is newly hypoxic, will obtain a  chest x-ray lab work.  Chest x-ray without focal infiltrate, actually has improved aeration from his last image study.  This makes me more concerned of the possibility of a PE, will obtain a CT angiogram of the chest.  I discussed the case with Dr. Oletta Darter, on call for critical care.  Recommended admission to the hospitalist for pulmonology consult in the morning, also recommended solumedrol 40mg  q 6 until evaled by pulm.   Will discuss with the hospitalist.    CRITICAL CARE Performed by: Cecilio Asper   Total critical care time: 35 minutes  Critical care time was exclusive of separately billable procedures and treating other patients.  Critical care was necessary to treat or prevent imminent or life-threatening deterioration.  Critical care was time spent personally by me on the following activities: development of treatment plan with patient and/or surrogate as well as nursing, discussions with consultants, evaluation of patient's response to treatment, examination of patient, obtaining history from patient or surrogate, ordering and performing treatments and interventions, ordering and review of laboratory studies, ordering and review of radiographic studies, pulse oximetry and re-evaluation of patient's condition.   The patients results and plan were reviewed and discussed.   Any x-rays performed were independently reviewed by myself.   Differential diagnosis were considered with the presenting HPI.  Medications  iopamidol (ISOVUE-370) 76 % injection (has no administration in time range)  methylPREDNISolone sodium succinate (SOLU-MEDROL) 40 mg/mL injection 40 mg (has no administration in time range)  iopamidol (ISOVUE-370) 76 % injection 100 mL (100 mLs Intravenous Contrast Given 01/31/18 1843)    Vitals:   01/31/18 1815 01/31/18 1830 01/31/18 1845 01/31/18 1915  BP: 111/69 (!) 142/67  111/64  Pulse: 94 (!) 130 96 93  Resp: (!) 21 17 20  (!) 30  Temp:      TempSrc:        SpO2: 97% 97% 97% 97%    Final diagnoses:  Acute respiratory failure with hypoxia (HCC)    Admission/ observation were discussed with the admitting physician, patient and/or family and they are comfortable with the plan.    Final Clinical Impressions(s) / ED Diagnoses   Final diagnoses:  Acute respiratory failure with hypoxia [facility])    ED Discharge Orders    None       Deno Etienne, DO 01/31/18 1934

## 2018-01-31 NOTE — H&P (Signed)
History and Physical   Charles Friedman BJS:[id number] DOB: 1933/01/30 DOA: 01/31/2018  PCP: Haywood Pao, MD  Chief Complaint: shortness of breath  HPI: This is an 84 year old man with medical problems including atrial fibrillation on anticoagulation, chronic diastolic heart failure, history of CVA by chart review, insulin-dependent diabetes complicated by peripheral neuropathy presenting with shortness of breath.  History is obtained from chart review, discussion with the emergency medicine team, review of paper charts from the skilled nursing facility, as well as the patient's son.  Of note, the patient is able to provide a limited history, but this is limited to single word answers.  Patient was recently made from December 8 to January 28, 2018 with acute decompensated heart failure.  He was discharged to a skilled nursing facility for short-term rehab.  Prior to this most recent admission he had been experiencing a functional decline over the past month.  Over the past month he has been needing help with his ADLs and is becoming increasingly weak.  His son reports over the past year he has had caregivers come and help manage his medications as well as other IADLs.  He walks with a 2 wheeled walker, struggles with balance due to his peripheral neuropathy.  After his most recent admission, he was discharged on 40 mg of furosemide twice daily.  Skilled nursing facility review does not appear that he has been receiving metolazone.  Son reports the patient has been constipated with minimal success with escalated bowel regimen.  Also some nausea and vomiting.  The patient reports having some upper airway congestion along with some increased dyspnea.  Other details include he retired from the [employer] in 1993, greatly values time with his grandchildren, does not drink alcohol or smoke.  Widowed.  At this clinician facility was reported that his O2 sats dropped less than 90% on room air,  therefore he is placed on supplemental oxygen and taken to the emergency department.  ED Course: In the emergency department vital signs remarkable for tachycardia ranging from 93-1 30, respiratory rate maximum of 30 breaths/min, SBP nadir of 102, diagnostics remarkable for white count 12.1, hemoglobin 14.4.  BMP unremarkable with exception of mild hyperglycemia 176.  Lactic acid normal.  BNP of 144 which is down from his baseline.  EKG sinus tachycardia.  Chest x-ray when compared to prior was improved from a heart failure standpoint.  Of note, prior chest x-ray was performed within the past week.  Due to concern for pulmonary embolus, CT PE study obtained remarkable for no PE, did reveal bilateral lower lobe atelectasis, cardiomegaly and coronary artery disease, as well as Tree-in-bud nodular densities inthe superior segment of the right lower lobe could reflect small airways disease/alveolitis..  Emergency medicine team reportedly spoke with critical care team where overall benefits of empiric steroids thought to outweigh risks, given IV 40 mg of methylprednisolone.  Hospital medicine consulted for further management.  VBG obtained pH 7.435, CO2 on the venous gas 53.  Review of Systems: A complete ROS was obtained; pertinent positives negatives are denoted in the HPI. Otherwise, all systems are negative.   Past Medical History:  Diagnosis Date  . Atrial fibrillation ([place]) 2003   Initially on Coumadin, now on Xarelto  . Cataract    Bilateral cataracts.  . Diabetes mellitus without complication ([place])    type II  . Diabetic peripheral neuropathy associated with type 2 diabetes mellitus ([place])   . Glaucoma    left eye  . Hx  of radiation therapy 08/15/13- 10/06/13   prostate 7600 cGy 38 sessions  . Hyperlipidemia with target LDL less than 70   . Neuropathy    diabetic, in feet  . Personal history of fall 09/26/13   front entrance of [place]  . Prostate cancer [facility])    History of radiation therapy    Social History   Socioeconomic History  . Marital status: Widowed    Spouse name: Not on file  . Number of children: 4  . Years of education: Not on file  . Highest education level: Not on file  Occupational History  . Occupation: Retired     Comment: [employer]  Social Needs  . Financial resource strain: Not hard at all  . Food insecurity:    Worry: Never true    Inability: Never true  . Transportation needs:    Medical: No    Non-medical: No  Tobacco Use  . Smoking status: Never Smoker  . Smokeless tobacco: Never Used  Substance and Sexual Activity  . Alcohol use: No  . Drug use: No  . Sexual activity: Not on file  Lifestyle  . Physical activity:    Days per week: 0 days    Minutes per session: 0 min  . Stress: Only a little  Relationships  . Social connections:    Talks on phone: More than three times a week    Gets together: Once a week    Attends religious service: Never    Active member of club or organization: No    Attends meetings of clubs or organizations: Never    Relationship status: Widowed  . Intimate partner violence:    Fear of current or ex partner: No    Emotionally abused: No    Physically abused: No    Forced sexual activity: No  Other Topics Concern  . Not on file  Social History Narrative   Pt lives alone -widowed with long-term companion / caregiver.   Currently lives in assisted living facility.   Has 2 sons, one in [place], [place] and the other in [place].     His son Akbar Sacra. lives here locally.  Phone [phone].   He is retired [employer] worker.  Retired in 1993 after 37 years of work.   Family History  Problem Relation Age of Onset  . Cancer Sister 82       brain tumor died at age 46  . Heart disease Mother        Marena Chancy of specifics  . Heart attack Father 48       sudden cardiac death    Physical Exam: Vitals:   01/31/18 2015 01/31/18 2030 01/31/18 2045 01/31/18 2124  BP: 134/75 131/68 137/75 (!) 147/88   Pulse: 96 94 92 93  Resp: (!) 24 (!) 21  18  Temp:    97.9 F (36.6 C)  TempSrc:    Oral  SpO2: 95% 96% 96% 97%   General: Obese white man, elderly, sleepy but answers simple questions. ENT: Grossly normal hearing, MMM. Cardiovascular: RRR. No M/R/G.  Trace bilateral lower extremity. Respiratory: Globally decreased breath sounds, intermittent upper airway rattling /wheezing, no stridor.  Breathing 2 L nasal cannula O2. Abdomen: Soft, non-tender.  Skin: Left heel with skin breakdown/injury, early stage II.  Bilateral feet with ecchymoses, scattered.  No active oozing. Musculoskeletal: Generalized weakness, unable to sit up Psychiatric: Grossly normal mood and affect. Neurologic: Oriented to year in hospital, answer simple questions but  is sleepy and frequently falls back asleep.  I have personally reviewed the following labs, culture data, and imaging studies.  Assessment/Plan:  #Respiratory distress Course: at SNF O2 sat of < 90% per son's report; CT PE study without evidence of PE, but did reveal Tree-in-bud nodular densities in the superior segment of the right lower lobe could reflect small airways disease/alveolitis.   A/P: upper airway congestion and transmitted sounds in conjunction with CT chest findings of small airway disease / alveolitis may point towards a viral process.  No PE. BNP is lower than baseline and CXR improved from prior which point away from HF as culprit. Will focus on supportive care with scheduled duo-nebs. Will not re-dose IV steroids for now, but will consider if pulmonary / ICU consult recommends such. Continue to provide supplemental O2 as needed to keep O2 at 92% or greater. RVP to eval for viral pathogens.  #Other problems: -Chronic diastolic / preserved EF HF: does not appear to be in acute decompensation; continue home furosemide 40 mg PO BID -AF, HX OF: continue BB + AC with DOAC; rate controlled at this time -Hx of CVA: continue home statin -Left  heel pressure induced skin injury: float heels, wound consult for optimal wound care strategies -Insulin dependent IH:KVQQV insulin at reduced dose (28->10 units) with rapid acting correction factor; adjust according to trend -Anxiety:continue home SSRI  DVT prophylaxis: On systemic AC Code Status: DNR/DNI after discussion on admission Disposition Plan: Anticipate D/C back to SNF when medically optimized Consults called: pulmonary / critical care consulted by emergency medicine team Admission status: admit to hospital medicine service   Cheri Rous, MD [facility] ZDGL:[phone]  If 7PM-7AM, please contact night-coverage [email or web address] Password [email or web address]  This document was created using the aid of voice recognition / dication software.

## 2018-02-01 ENCOUNTER — Observation Stay (HOSPITAL_COMMUNITY): Payer: Medicare Other

## 2018-02-01 DIAGNOSIS — I480 Paroxysmal atrial fibrillation: Secondary | ICD-10-CM | POA: Diagnosis present

## 2018-02-01 DIAGNOSIS — R0603 Acute respiratory distress: Secondary | ICD-10-CM | POA: Diagnosis not present

## 2018-02-01 DIAGNOSIS — M2550 Pain in unspecified joint: Secondary | ICD-10-CM | POA: Diagnosis not present

## 2018-02-01 DIAGNOSIS — L89610 Pressure ulcer of right heel, unstageable: Secondary | ICD-10-CM | POA: Diagnosis present

## 2018-02-01 DIAGNOSIS — R112 Nausea with vomiting, unspecified: Secondary | ICD-10-CM | POA: Diagnosis not present

## 2018-02-01 DIAGNOSIS — N400 Enlarged prostate without lower urinary tract symptoms: Secondary | ICD-10-CM | POA: Diagnosis present

## 2018-02-01 DIAGNOSIS — T501X5A Adverse effect of loop [high-ceiling] diuretics, initial encounter: Secondary | ICD-10-CM | POA: Diagnosis not present

## 2018-02-01 DIAGNOSIS — E119 Type 2 diabetes mellitus without complications: Secondary | ICD-10-CM | POA: Diagnosis not present

## 2018-02-01 DIAGNOSIS — R531 Weakness: Secondary | ICD-10-CM | POA: Diagnosis not present

## 2018-02-01 DIAGNOSIS — R740 Nonspecific elevation of levels of transaminase and lactic acid dehydrogenase [LDH]: Secondary | ICD-10-CM | POA: Diagnosis present

## 2018-02-01 DIAGNOSIS — I5032 Chronic diastolic (congestive) heart failure: Secondary | ICD-10-CM

## 2018-02-01 DIAGNOSIS — I5033 Acute on chronic diastolic (congestive) heart failure: Secondary | ICD-10-CM | POA: Diagnosis not present

## 2018-02-01 DIAGNOSIS — R918 Other nonspecific abnormal finding of lung field: Secondary | ICD-10-CM | POA: Diagnosis not present

## 2018-02-01 DIAGNOSIS — Z8673 Personal history of transient ischemic attack (TIA), and cerebral infarction without residual deficits: Secondary | ICD-10-CM | POA: Diagnosis not present

## 2018-02-01 DIAGNOSIS — J690 Pneumonitis due to inhalation of food and vomit: Secondary | ICD-10-CM | POA: Diagnosis present

## 2018-02-01 DIAGNOSIS — L89890 Pressure ulcer of other site, unstageable: Secondary | ICD-10-CM | POA: Diagnosis present

## 2018-02-01 DIAGNOSIS — E538 Deficiency of other specified B group vitamins: Secondary | ICD-10-CM | POA: Diagnosis present

## 2018-02-01 DIAGNOSIS — E669 Obesity, unspecified: Secondary | ICD-10-CM | POA: Diagnosis present

## 2018-02-01 DIAGNOSIS — E1142 Type 2 diabetes mellitus with diabetic polyneuropathy: Secondary | ICD-10-CM | POA: Diagnosis present

## 2018-02-01 DIAGNOSIS — F419 Anxiety disorder, unspecified: Secondary | ICD-10-CM | POA: Diagnosis present

## 2018-02-01 DIAGNOSIS — R5381 Other malaise: Secondary | ICD-10-CM | POA: Diagnosis not present

## 2018-02-01 DIAGNOSIS — J9601 Acute respiratory failure with hypoxia: Secondary | ICD-10-CM | POA: Diagnosis present

## 2018-02-01 DIAGNOSIS — K5900 Constipation, unspecified: Secondary | ICD-10-CM | POA: Diagnosis not present

## 2018-02-01 DIAGNOSIS — Z7401 Bed confinement status: Secondary | ICD-10-CM | POA: Diagnosis not present

## 2018-02-01 DIAGNOSIS — I639 Cerebral infarction, unspecified: Secondary | ICD-10-CM | POA: Diagnosis not present

## 2018-02-01 DIAGNOSIS — E785 Hyperlipidemia, unspecified: Secondary | ICD-10-CM | POA: Diagnosis present

## 2018-02-01 DIAGNOSIS — N179 Acute kidney failure, unspecified: Secondary | ICD-10-CM | POA: Diagnosis not present

## 2018-02-01 DIAGNOSIS — Z6833 Body mass index (BMI) 33.0-33.9, adult: Secondary | ICD-10-CM | POA: Diagnosis not present

## 2018-02-01 DIAGNOSIS — Z66 Do not resuscitate: Secondary | ICD-10-CM | POA: Diagnosis present

## 2018-02-01 DIAGNOSIS — H409 Unspecified glaucoma: Secondary | ICD-10-CM | POA: Diagnosis present

## 2018-02-01 DIAGNOSIS — R627 Adult failure to thrive: Secondary | ICD-10-CM | POA: Diagnosis present

## 2018-02-01 DIAGNOSIS — E1165 Type 2 diabetes mellitus with hyperglycemia: Secondary | ICD-10-CM | POA: Diagnosis present

## 2018-02-01 DIAGNOSIS — Z8546 Personal history of malignant neoplasm of prostate: Secondary | ICD-10-CM | POA: Diagnosis not present

## 2018-02-01 LAB — RESPIRATORY PANEL BY PCR
Adenovirus: NOT DETECTED
Bordetella pertussis: NOT DETECTED
Chlamydophila pneumoniae: NOT DETECTED
Coronavirus 229E: NOT DETECTED
Coronavirus HKU1: NOT DETECTED
Coronavirus NL63: NOT DETECTED
Coronavirus OC43: NOT DETECTED
Influenza A: NOT DETECTED
Influenza B: NOT DETECTED
Metapneumovirus: NOT DETECTED
Mycoplasma pneumoniae: NOT DETECTED
PARAINFLUENZA VIRUS 2-RVPPCR: NOT DETECTED
Parainfluenza Virus 1: NOT DETECTED
Parainfluenza Virus 3: NOT DETECTED
Parainfluenza Virus 4: NOT DETECTED
Respiratory Syncytial Virus: NOT DETECTED
Rhinovirus / Enterovirus: NOT DETECTED

## 2018-02-01 LAB — BASIC METABOLIC PANEL
Anion gap: 13 (ref 5–15)
BUN: 47 mg/dL — ABNORMAL HIGH (ref 8–23)
CO2: 29 mmol/L (ref 22–32)
Calcium: 8.8 mg/dL — ABNORMAL LOW (ref 8.9–10.3)
Chloride: 99 mmol/L (ref 98–111)
Creatinine, Ser: 1.14 mg/dL (ref 0.61–1.24)
GFR calc non Af Amer: 59 mL/min — ABNORMAL LOW (ref >60)
Glucose, Bld: 236 mg/dL — ABNORMAL HIGH (ref 70–99)
Potassium: 4.8 mmol/L (ref 3.5–5.1)
Sodium: 141 mmol/L (ref 135–145)

## 2018-02-01 LAB — CBC
HCT: 44.5 % (ref 39.0–52.0)
Hemoglobin: 13.8 g/dL (ref 13.0–17.0)
MCH: 28.2 pg (ref 26.0–34.0)
MCHC: 31 g/dL (ref 30.0–36.0)
MCV: 91 fL (ref 80.0–100.0)
Platelets: 296 10*3/uL (ref 150–400)
RBC: 4.89 MIL/uL (ref 4.22–5.81)
RDW: 13.4 % (ref 11.5–15.5)
WBC: 10.4 10*3/uL (ref 4.0–10.5)
nRBC: 0 % (ref 0.0–0.2)

## 2018-02-01 LAB — T4: T4, Total: 6.1 ug/dL (ref 4.5–12.0)

## 2018-02-01 LAB — URINALYSIS, ROUTINE W REFLEX MICROSCOPIC
Bilirubin Urine: NEGATIVE
Glucose, UA: NEGATIVE mg/dL
Ketones, ur: NEGATIVE mg/dL
Nitrite: NEGATIVE
Protein, ur: NEGATIVE mg/dL
Specific Gravity, Urine: 1.015 (ref 1.005–1.030)
pH: 5 (ref 5.0–8.0)

## 2018-02-01 LAB — GLUCOSE, CAPILLARY
GLUCOSE-CAPILLARY: 168 mg/dL — AB (ref 70–99)
GLUCOSE-CAPILLARY: 260 mg/dL — AB (ref 70–99)
Glucose-Capillary: 237 mg/dL — ABNORMAL HIGH (ref 70–99)
Glucose-Capillary: 283 mg/dL — ABNORMAL HIGH (ref 70–99)
Glucose-Capillary: 212 mg/dL — ABNORMAL HIGH (ref 70–99)

## 2018-02-01 MED ORDER — METOPROLOL TARTRATE 12.5 MG HALF TABLET
12.5000 mg | ORAL_TABLET | Freq: Two times a day (BID) | ORAL | Status: DC
  Administered 2018-02-01 – 2018-02-07 (×14): 12.5 mg via ORAL
  Filled 2018-02-01 (×15): qty 1

## 2018-02-01 MED ORDER — ATORVASTATIN CALCIUM 40 MG PO TABS
40.0000 mg | ORAL_TABLET | Freq: Every day | ORAL | Status: DC
  Administered 2018-02-01 – 2018-02-02 (×2): 40 mg via ORAL
  Filled 2018-02-01 (×2): qty 1

## 2018-02-01 MED ORDER — COLLAGENASE 250 UNIT/GM EX OINT
TOPICAL_OINTMENT | Freq: Every day | CUTANEOUS | Status: DC
  Administered 2018-02-01 – 2018-02-06 (×6): via TOPICAL
  Filled 2018-02-01: qty 30

## 2018-02-01 MED ORDER — INSULIN ASPART 100 UNIT/ML ~~LOC~~ SOLN: 0.0000 [IU] | Freq: Every day | SUBCUTANEOUS | Status: DC

## 2018-02-01 MED ORDER — PIPERACILLIN-TAZOBACTAM 3.375 G IVPB 30 MIN
3.3750 g | Freq: Once | INTRAVENOUS | Status: AC
  Administered 2018-02-01: 3.375 g via INTRAVENOUS
  Filled 2018-02-01: qty 50

## 2018-02-01 MED ORDER — FUROSEMIDE 40 MG PO TABS
40.0000 mg | ORAL_TABLET | Freq: Two times a day (BID) | ORAL | Status: DC
  Administered 2018-02-01: 40 mg via ORAL
  Filled 2018-02-01: qty 1

## 2018-02-01 MED ORDER — TAMSULOSIN HCL 0.4 MG PO CAPS
0.4000 mg | ORAL_CAPSULE | Freq: Every day | ORAL | Status: DC
  Administered 2018-02-01 – 2018-02-07 (×6): 0.4 mg via ORAL
  Filled 2018-02-01 (×7): qty 1

## 2018-02-01 MED ORDER — SENNOSIDES-DOCUSATE SODIUM 8.6-50 MG PO TABS
1.0000 | ORAL_TABLET | Freq: Two times a day (BID) | ORAL | Status: DC
  Administered 2018-02-01 – 2018-02-07 (×13): 1 via ORAL
  Filled 2018-02-01 (×14): qty 1

## 2018-02-01 MED ORDER — RIVAROXABAN 20 MG PO TABS
20.0000 mg | ORAL_TABLET | Freq: Every day | ORAL | Status: DC
  Administered 2018-02-01 – 2018-02-06 (×6): 20 mg via ORAL
  Filled 2018-02-01 (×6): qty 1

## 2018-02-01 MED ORDER — INSULIN GLARGINE 100 UNIT/ML ~~LOC~~ SOLN
15.0000 [IU] | Freq: Every day | SUBCUTANEOUS | Status: DC
  Administered 2018-02-01 – 2018-02-03 (×3): 15 [IU] via SUBCUTANEOUS
  Filled 2018-02-01 (×3): qty 0.15

## 2018-02-01 MED ORDER — ESCITALOPRAM OXALATE 10 MG PO TABS
5.0000 mg | ORAL_TABLET | Freq: Every day | ORAL | Status: DC
  Administered 2018-02-01 – 2018-02-02 (×2): 5 mg via ORAL
  Filled 2018-02-01 (×3): qty 1

## 2018-02-01 MED ORDER — SODIUM CHLORIDE 0.9 % IV SOLN
500.0000 mg | INTRAVENOUS | Status: DC
  Administered 2018-02-01 – 2018-02-02 (×2): 500 mg via INTRAVENOUS
  Filled 2018-02-01 (×2): qty 500

## 2018-02-01 MED ORDER — SODIUM CHLORIDE 0.9% FLUSH
3.0000 mL | Freq: Two times a day (BID) | INTRAVENOUS | Status: DC
  Administered 2018-02-01 – 2018-02-07 (×13): 3 mL via INTRAVENOUS

## 2018-02-01 MED ORDER — INSULIN ASPART 100 UNIT/ML ~~LOC~~ SOLN
3.0000 [IU] | Freq: Three times a day (TID) | SUBCUTANEOUS | Status: DC
  Administered 2018-02-01 – 2018-02-02 (×5): 3 [IU] via SUBCUTANEOUS

## 2018-02-01 MED ORDER — ZINC OXIDE 40 % EX OINT
TOPICAL_OINTMENT | Freq: Four times a day (QID) | CUTANEOUS | Status: DC
  Administered 2018-02-01 – 2018-02-07 (×24): via TOPICAL
  Filled 2018-02-01: qty 57

## 2018-02-01 MED ORDER — PIPERACILLIN-TAZOBACTAM 3.375 G IVPB
3.3750 g | Freq: Three times a day (TID) | INTRAVENOUS | Status: DC
  Administered 2018-02-01 – 2018-02-02 (×3): 3.375 g via INTRAVENOUS
  Filled 2018-02-01 (×4): qty 50

## 2018-02-01 MED ORDER — INSULIN ASPART 100 UNIT/ML ~~LOC~~ SOLN
0.0000 [IU] | Freq: Three times a day (TID) | SUBCUTANEOUS | Status: DC
  Administered 2018-02-01: 3 [IU] via SUBCUTANEOUS
  Administered 2018-02-01 (×2): 5 [IU] via SUBCUTANEOUS
  Administered 2018-02-02: 3 [IU] via SUBCUTANEOUS
  Administered 2018-02-02 (×2): 2 [IU] via SUBCUTANEOUS

## 2018-02-01 MED ORDER — GUAIFENESIN ER 600 MG PO TB12
600.0000 mg | ORAL_TABLET | Freq: Two times a day (BID) | ORAL | Status: DC
  Administered 2018-02-01 – 2018-02-07 (×12): 600 mg via ORAL
  Filled 2018-02-01 (×13): qty 1

## 2018-02-01 MED ORDER — IPRATROPIUM-ALBUTEROL 0.5-2.5 (3) MG/3ML IN SOLN
3.0000 mL | Freq: Three times a day (TID) | RESPIRATORY_TRACT | Status: DC
  Administered 2018-02-01 – 2018-02-06 (×15): 3 mL via RESPIRATORY_TRACT
  Filled 2018-02-01 (×14): qty 3

## 2018-02-01 MED ORDER — IPRATROPIUM-ALBUTEROL 0.5-2.5 (3) MG/3ML IN SOLN
3.0000 mL | Freq: Four times a day (QID) | RESPIRATORY_TRACT | Status: DC
  Administered 2018-02-01: 3 mL via RESPIRATORY_TRACT
  Filled 2018-02-01: qty 3

## 2018-02-01 MED ORDER — FUROSEMIDE 10 MG/ML IJ SOLN
40.0000 mg | Freq: Once | INTRAMUSCULAR | Status: AC
  Administered 2018-02-01: 40 mg via INTRAVENOUS
  Filled 2018-02-01: qty 4

## 2018-02-01 MED ORDER — POLYETHYLENE GLYCOL 3350 17 G PO PACK
17.0000 g | PACK | Freq: Two times a day (BID) | ORAL | Status: DC
  Administered 2018-02-01 – 2018-02-02 (×5): 17 g via ORAL
  Filled 2018-02-01 (×5): qty 1

## 2018-02-01 NOTE — Plan of Care (Signed)
  Problem: Clinical Measurements: Goal: Respiratory complications will improve Outcome: Progressing   Problem: Nutrition: Goal: Adequate nutrition will be maintained Outcome: Progressing   

## 2018-02-01 NOTE — Evaluation (Signed)
Physical Therapy Evaluation Patient Details Name: Charles Friedman MRN: [id number] DOB: 08/05/1932 Today's Date: 02/01/2018   History of Present Illness  Patient is a 84 y.o. male admitted 01/24/18 and again on 02/01/18 with SOB and weakness. Found to have acute decompensated CHF. Of note, admitted in Sept for similar symptoms. PMH includes A-fib, CVA, bil cataracts, DM, glaucoma, diabetic neuropathy, prostate CA.  Clinical Impression  Orders received for PT evaluation. Patient demonstrates deficits in functional mobility as indicated below. Will benefit from continued skilled PT to address deficits and maximize function. Will see as indicated and progress as tolerated.  Recommend SNF upon acute discharge.    Follow Up Recommendations SNF;Supervision for mobility/OOB    Equipment Recommendations  None recommended by PT    Recommendations for Other Services       Precautions / Restrictions Precautions Precautions: Fall Precaution Comments: Inconsistent with amount of assist required Restrictions Weight Bearing Restrictions: No      Mobility  Bed Mobility Overal bed mobility: Needs Assistance Bed Mobility: Supine to Sit;Sit to Supine     Supine to sit: Mod assist;HOB elevated Sit to supine: Mod assist;HOB elevated   General bed mobility comments: Moderate assist to elevate trunk to upright and to assist with LEs upon return to supine  Transfers Overall transfer level: Needs assistance Equipment used: Rolling walker (2 wheeled) Transfers: Sit to/from [facility] Sit to Stand: Max assist;+2 physical assistance Stand pivot transfers: Max assist;+2 physical assistance       General transfer comment: Max assist to stand pivot from bed to air bed and Max assist to stand with RW for hygiene and pericare x2  Ambulation/Gait             General Gait Details: Unable to take steps with maxA+2 and RW  Stairs            Wheelchair Mobility    Modified  [place] (Stroke Patients Only)       Balance Overall balance assessment: Needs assistance;History of Falls Sitting-balance support: Feet supported;Single extremity supported Sitting balance-Leahy Scale: Poor Sitting balance - Comments: Some external support required dueing sitting balance due to fatigue and posterior balance Postural control: Posterior lean Standing balance support: During functional activity;Bilateral upper extremity supported Standing balance-Leahy Scale: Zero Standing balance comment: Reliant on external support, maxA; dependent for pericare while standing                             Pertinent Vitals/Pain Pain Assessment: No/denies pain    Home Living Family/patient expects to be discharged to:: Private residence Living Arrangements: Other (Comment);Other relatives(with caregivers) Available Help at Discharge: Personal care attendant;Available PRN/intermittently;Family Type of Home: House Home Access: Ramped entrance     Home Layout: Multi-level;Laundry or work area in [place]: Environmental consultant - 2 wheels;Shower seat;Bedside commode;Grab bars - tub/shower;Grab bars - toilet;Wheelchair - manual Additional Comments: patient unreliable historian this morning and family not present to provide further information/details; PLOF and equipment history taken from prior charting     Prior Function Level of Independence: Needs assistance   Gait / Transfers Assistance Needed: Minimal walking with RW, uses w/c for mobility  ADL's / Homemaking Assistance Needed: Assists with ADLs- LB dressing  Comments: PLOF taken from previous chart review     Hand Dominance   Dominant Hand: Right    Extremity/Trunk Assessment   Upper Extremity Assessment Upper Extremity Assessment: Generalized weakness    Lower Extremity Assessment  Lower Extremity Assessment: Generalized weakness    Cervical / Trunk Assessment Cervical / Trunk Assessment: Kyphotic   Communication   Communication: No difficulties  Cognition Arousal/Alertness: Awake/alert Behavior During Therapy: WFL for tasks assessed/performed Overall Cognitive Status: No family/caregiver present to determine baseline cognitive functioning Area of Impairment: Memory;Following commands;Problem solving;Attention;Safety/judgement;Awareness                 Orientation Level: Time;Disoriented to Current Attention Level: Selective Memory: Decreased short-term memory Following Commands: Follows one step commands with increased time Safety/Judgement: Decreased awareness of safety;Decreased awareness of deficits Awareness: Emergent Problem Solving: Slow processing;Decreased initiation;Difficulty sequencing;Requires verbal cues;Requires tactile cues General Comments: Patient with some slow processing      General Comments      Exercises     Assessment/Plan    PT Assessment Patient needs continued PT services  PT Problem List Decreased strength;Decreased mobility;Decreased balance;Decreased activity tolerance;Decreased cognition;Cardiopulmonary status limiting activity       PT Treatment Interventions Functional mobility training;Balance training;Gait training;Therapeutic activities;Therapeutic exercise;Patient/family education;Wheelchair mobility training    PT Goals (Current goals can be found in the Care Plan section)  Acute Rehab PT Goals Patient Stated Goal: to feel better PT Goal Formulation: With patient Time For Goal Achievement: 02/08/18 Potential to Achieve Goals: Good    Frequency Min 2X/week   Barriers to discharge Decreased caregiver support      Co-evaluation               AM-PAC PT "6 Clicks" Mobility  Outcome Measure Help needed turning from your back to your side while in a flat bed without using bedrails?: A Lot Help needed moving from lying on your back to sitting on the side of a flat bed without using bedrails?: A Lot Help needed moving  to and from a bed to a chair (including a wheelchair)?: A Lot Help needed standing up from a chair using your arms (e.g., wheelchair or bedside chair)?: A Lot Help needed to walk in hospital room?: A Lot Help needed climbing 3-5 steps with a railing? : Total 6 Click Score: 11    End of Session Equipment Utilized During Treatment: Gait belt Activity Tolerance: Patient limited by fatigue Patient left: in bed;with call bell/phone within reach;with nursing/sitter in room Nurse Communication: Mobility status PT Visit Diagnosis: Muscle weakness (generalized) (M62.81);Difficulty in walking, not elsewhere classified (R26.2)    Time: 2119-4174 PT Time Calculation (min) (ACUTE ONLY): 22 min   Charges:   PT Evaluation $PT Eval Moderate Complexity: 1 Mod          Alben Deeds, PT DPT  Board Certified Neurologic Specialist Acute Rehabilitation Services Pager [phone] Office [place] 02/01/2018, 5:00 PM

## 2018-02-01 NOTE — Progress Notes (Signed)
Patient given flutter valve and instructed on appropriate use.  Patient able to perform well and with good effort.  Will continue to monitor.

## 2018-02-01 NOTE — Progress Notes (Signed)
Patient arrived from ED on stretcher transferred to bed. Patent alert and verbal confused to time. Pt able to follow commands and answer appropriately with some confusion. Son at bedside. Patient has multiple skin issues including abrasion to bilateral buttocks, open areas to bilateral heels right toes with small dot areas, large blister to bottom of left foot, scattered scabbed areas at left shin, and bruising. Patient also has excoriated areas bilateral groin. Bilateral lower extremities darkened. O2 at 2 via nasal cannula. Patient does use abdominal muscles with breathing as reported by ED however rests quietly at intervals with reparatory rate 18-22. Patient has a watch he wishes to keep with him

## 2018-02-01 NOTE — Discharge Instructions (Signed)

## 2018-02-01 NOTE — Progress Notes (Signed)
PROGRESS NOTE        PATIENT DETAILS Name: Charles Friedman Age: 84 y.o. Sex: male Date of Birth: 11/28/1932 Admit Date: 01/31/2018 Admitting Physician Vilma Prader, MD UKG:[id number], Fransico Him, MD  Brief Narrative: Patient is a 84 y.o. male history of paroxysmal atrial fibrillation on anticoagulation, chronic diastolic heart failure, history of CVA, insulin-dependent diabetes-failure to thrive syndrome-just discharged from this hospital on 12/8-presenting with worsening weakness, chest congestion, shortness of breath-with some suggestion for aspiration pneumonia-and admitted to the hospitalist service.  See below for further details  Subjective: " Gurgling"-very poor historian-at times confused.  Denies chest pain or abdominal pain.  Lying comfortably in bed.  Assessment/Plan: Acute hypoxic respiratory failure: Etiology uncertain-but seems to be accumulating secretions-and is " gurgling"-sounds very congested as well.  Elderly-failure to thrive-apparently has a history of CVA-worried that patient may have aspiration pneumonitis at this point-we will ask speech therapy to evaluate and empirically start on Zosyn.  He really does not have any evidence of volume overload at this point.  Respiratory virus panel negative.  Continue attempts at chest PT-encourage incentive spirometry/flutter valve-Mucinex and bronchodilators.  Have asked nursing staff to titrate off oxygen as tolerated.  CT chest negative for PE-but indicated above small airway disease/alveolitis.  Chronic diastolic heart failure: Does not appear to be volume overloaded-but given hypoxia/respiratory failure-we will start IV Lasix and continue diuretics as long as renal function can tolerate.  Start following daily weights, intake/output and electrolytes.  Paroxysmal atrial fibrillation: Appears to be sinus rhythm on twelve-lead EKG on admission-we will place on telemetry starting today-continue metoprolol and  Xarelto.  Follow.  DM-2: CBG stable-continue Lantus 15 units daily, 3 units of NovoLog with meals and SSI.  Follow and adjust accordingly.  BPH: Continue Flomax  Failure to thrive syndrome/acute on chronic debility: Per H&P-patient has been gradually declining over the past few months-worsening debility likely secondary to acute illness.  Will have PT/OT evaluate patient.  DNR in place-if he deteriorates further-we will get in touch with family.  Chronic left foot wound/right heel wound-unstageable:: Present prior to admission-appreciate wound care evaluation.  DVT Prophylaxis: Full dose anticoagulation with Xarelto  Code Status:  DNR  Family Communication: None at bedside  Disposition Plan: Remain inpatient- back to SNF on discharge over the next few days  Antimicrobial agents: Anti-infectives (From admission, onward)   Start     Dose/Rate Route Frequency Ordered Stop   02/01/18 1400  piperacillin-tazobactam (ZOSYN) IVPB 3.375 g     3.375 g 12.5 mL/hr over 240 Minutes Intravenous Every 8 hours 02/01/18 0759     02/01/18 0800  piperacillin-tazobactam (ZOSYN) IVPB 3.375 g     3.375 g 100 mL/hr over 30 Minutes Intravenous  Once 02/01/18 0759 02/01/18 1042   02/01/18 0745  azithromycin (ZITHROMAX) 500 mg in sodium chloride 0.9 % 250 mL IVPB     500 mg 250 mL/hr over 60 Minutes Intravenous Every 24 hours 02/01/18 0734        Procedures: None  CONSULTS:  None  Time spent: 35 minutes-Greater than 50% of this time was spent in counseling, explanation of diagnosis, planning of further management, and coordination of care.  MEDICATIONS: Scheduled Meds: . atorvastatin  40 mg Oral q1800  . collagenase   Topical Daily  . escitalopram  5 mg Oral Daily  . guaiFENesin  600 mg Oral  BID  . insulin aspart  0-9 Units Subcutaneous TID WC  . insulin aspart  3 Units Subcutaneous TID WC  . insulin glargine  15 Units Subcutaneous Daily  . ipratropium-albuterol  3 mL Nebulization TID    . liver oil-zinc oxide   Topical QID  . metoprolol tartrate  12.5 mg Oral BID  . polyethylene glycol  17 g Oral BID  . rivaroxaban  20 mg Oral Q supper  . senna-docusate  1 tablet Oral BID  . sodium chloride flush  3 mL Intravenous Q12H  . tamsulosin  0.4 mg Oral Daily   Continuous Infusions: . azithromycin 500 mg (02/01/18 1011)  . piperacillin-tazobactam (ZOSYN)  IV     PRN Meds:.   PHYSICAL EXAM: Vital signs: Vitals:   01/31/18 2124 01/31/18 2309 02/01/18 0800 02/01/18 0922  BP: (!) 147/88 132/74 (!) 123/53   Pulse: 93 90 86   Resp: 18 18 15    Temp: 97.9 F (36.6 C) 98.3 F (36.8 C)    TempSrc: Oral Oral    SpO2: 97% 95% 94% 96%   There were no vitals filed for this visit. There is no height or weight on file to calculate BMI.   General appearance :Awake, alert, not in any distress.  Looks frail-chronically ill-appearing. Eyes: No scleral icterus HEENT: Atraumatic and Normocephalic Neck: supple Resp:Good air entry bilaterally, as bibasilar rales but a lot of transmitted upper airway sounds as well. CVS: S1 S2 regular GI: Bowel sounds present, Non tender and not distended with no gaurding, rigidity or rebound.No organomegaly Extremities: B/L Lower Ext shows no edema, both legs are warm to touch Neurology: Has generalized weakness-but appears nonfocal Musculoskeletal:No digital cyanosis Skin:No Rash, warm and dry Wounds:N/A  I have personally reviewed following labs and imaging studies  LABORATORY DATA: CBC: Recent Labs  Lab 01/26/18 0454 01/27/18 0526 01/28/18 0415 01/31/18 1706 01/31/18 1743 02/01/18 0245  WBC 8.2 8.4 7.8 12.1*  --  10.4  NEUTROABS  --   --   --  9.7*  --   --   HGB 13.3 13.1 13.4 14.4 15.3 13.8  HCT 41.0 41.1 41.8 45.4 45.0 44.5  MCV 88.9 89.5 89.1 91.0  --  91.0  PLT 247 274 286 293  --  481    Basic Metabolic Panel: Recent Labs  Lab 01/26/18 0454 01/27/18 0526 01/28/18 0415 01/31/18 1716 01/31/18 1743 02/01/18 0245  NA  138 138 139 138 140 141  K 3.7 3.7 3.8 4.2 4.1 4.8  CL 98 100 99 99 99 99  CO2 27 29 29  32  --  29  GLUCOSE 157* 127* 126* 180* 176* 236*  BUN 53* 52* 50* 47* 46* 47*  CREATININE 1.39* 1.22 1.26* 1.11 1.10 1.14  CALCIUM 8.6* 8.7* 8.9 8.7*  --  8.8*  MG 2.0 2.1 2.1  --   --   --     GFR: Estimated Creatinine Clearance: 64 mL/min (by C-G formula based on SCr of 1.14 mg/dL).  Liver Function Tests: Recent Labs  Lab 01/27/18 0526 01/31/18 1716  AST 122* 120*  ALT 89* 101*  ALKPHOS 102 144*  BILITOT 1.1 1.0  PROT 6.2* 7.4  ALBUMIN 2.5* 2.8*   No results for input(s): LIPASE, AMYLASE in the last 168 hours. Recent Labs  Lab 01/31/18 1716  AMMONIA 15    Coagulation Profile: Recent Labs  Lab 01/31/18 1716  INR 1.24    Cardiac Enzymes: No results for input(s): CKTOTAL, CKMB, CKMBINDEX, TROPONINI in the last  168 hours.  BNP (last 3 results) No results for input(s): PROBNP in the last 8760 hours.  HbA1C: No results for input(s): HGBA1C in the last 72 hours.  CBG: Recent Labs  Lab 01/28/18 0751 01/28/18 1120 01/31/18 2142 02/01/18 0757 02/01/18 1125  GLUCAP 134* 166* 168* 237* 260*    Lipid Profile: No results for input(s): CHOL, HDL, LDLCALC, TRIG, CHOLHDL, LDLDIRECT in the last 72 hours.  Thyroid Function Tests: Recent Labs    01/31/18 1716 01/31/18 1717  TSH  --  1.828  T4TOTAL 6.1  --     Anemia Panel: No results for input(s): VITAMINB12, FOLATE, FERRITIN, TIBC, IRON, RETICCTPCT in the last 72 hours.  Urine analysis:    Component Value Date/Time   COLORURINE YELLOW 02/01/2018 [place] 02/01/2018 1014   LABSPEC 1.015 02/01/2018 1014   PHURINE 5.0 02/01/2018 1014   GLUCOSEU NEGATIVE 02/01/2018 1014   HGBUR MODERATE (A) 02/01/2018 1014   BILIRUBINUR NEGATIVE 02/01/2018 1014   KETONESUR NEGATIVE 02/01/2018 1014   PROTEINUR NEGATIVE 02/01/2018 1014   UROBILINOGEN 0.2 07/15/2014 2038   NITRITE NEGATIVE 02/01/2018 1014    LEUKOCYTESUR SMALL (A) 02/01/2018 1014    Sepsis Labs: Lactic Acid, Venous    Component Value Date/Time   LATICACIDVEN 1.55 01/31/2018 1743    MICROBIOLOGY: Recent Results (from the past 240 hour(s))  Culture, blood (routine x 2)     Status: None   Collection Time: 01/24/18  5:12 PM  Result Value Ref Range Status   Specimen Description BLOOD BLOOD RIGHT FOREARM  Final   Special Requests   Final    BOTTLES DRAWN AEROBIC ONLY Blood Culture adequate volume   Culture   Final    NO GROWTH 5 DAYS Performed at [facility], [place] [address] [place], [place] [address]    Report Status 01/29/2018 FINAL  Final  Culture, blood (routine x 2)     Status: None   Collection Time: 01/24/18  5:12 PM  Result Value Ref Range Status   Specimen Description BLOOD BLOOD RIGHT WRIST  Final   Special Requests   Final    BOTTLES DRAWN AEROBIC AND ANAEROBIC Blood Culture adequate volume   Culture   Final    NO GROWTH 5 DAYS Performed at [facility], [place] [address] [place], [place] [address]    Report Status 01/29/2018 FINAL  Final  Urine culture     Status: None   Collection Time: 01/24/18  5:12 PM  Result Value Ref Range Status   Specimen Description URINE, RANDOM  Final   Special Requests NONE  Final   Culture   Final    NO GROWTH Performed at [facility], [place] [address] [place], [place] [address]    Report Status 01/25/2018 FINAL  Final  Blood culture (routine x 2)     Status: None (Preliminary result)   Collection Time: 01/31/18  5:07 PM  Result Value Ref Range Status   Specimen Description BLOOD LEFT ANTECUBITAL  Final   Special Requests   Final    BOTTLES DRAWN AEROBIC AND ANAEROBIC Blood Culture adequate volume   Culture   Final    NO GROWTH < 24 HOURS Performed at [facility], [place] [address] [place],  [address]    Report Status PENDING  Incomplete  Blood culture (routine x 2)     Status: None (Preliminary result)   Collection Time: 01/31/18   5:12 PM  Result Value Ref  Range Status   Specimen Description BLOOD SITE NOT SPECIFIED  Final   Special Requests   Final    BOTTLES DRAWN AEROBIC AND ANAEROBIC Blood Culture adequate volume   Culture   Final    NO GROWTH < 24 HOURS Performed at [facility], [address], [place], [place] [address]    Report Status PENDING  Incomplete  Respiratory Panel by PCR     Status: None   Collection Time: 02/01/18  1:13 AM  Result Value Ref Range Status   Adenovirus NOT DETECTED NOT DETECTED Final   Coronavirus 229E NOT DETECTED NOT DETECTED Final   Coronavirus HKU1 NOT DETECTED NOT DETECTED Final   Coronavirus NL63 NOT DETECTED NOT DETECTED Final   Coronavirus OC43 NOT DETECTED NOT DETECTED Final   Metapneumovirus NOT DETECTED NOT DETECTED Final   Rhinovirus / Enterovirus NOT DETECTED NOT DETECTED Final   Influenza A NOT DETECTED NOT DETECTED Final   Influenza B NOT DETECTED NOT DETECTED Final   Parainfluenza Virus 1 NOT DETECTED NOT DETECTED Final   Parainfluenza Virus 2 NOT DETECTED NOT DETECTED Final   Parainfluenza Virus 3 NOT DETECTED NOT DETECTED Final   Parainfluenza Virus 4 NOT DETECTED NOT DETECTED Final   Respiratory Syncytial Virus NOT DETECTED NOT DETECTED Final   Bordetella pertussis NOT DETECTED NOT DETECTED Final   Chlamydophila pneumoniae NOT DETECTED NOT DETECTED Final   Mycoplasma pneumoniae NOT DETECTED NOT DETECTED Final    Comment: Performed at [facility], [place] [address], [place], [place] [address]    RADIOLOGY STUDIES/RESULTS: Ct Head Wo Contrast  Result Date: 01/24/2018 CLINICAL DATA:  Altered mental status. EXAM: CT HEAD WITHOUT CONTRAST TECHNIQUE: Contiguous axial images were obtained from the base of the skull through the vertex without intravenous contrast. COMPARISON:  10/20/2017 FINDINGS: Brain: There is no evidence for acute hemorrhage, hydrocephalus, mass lesion, or abnormal extra-axial fluid collection. No definite CT evidence for acute  infarction. Diffuse loss of parenchymal volume is consistent with atrophy. Patchy low attenuation in the deep hemispheric and periventricular white matter is nonspecific, but likely reflects chronic microvascular ischemic demyelination. Vascular: No hyperdense vessel or unexpected calcification. Skull: No evidence for fracture. No worrisome lytic or sclerotic lesion. Sinuses/Orbits: The visualized paranasal sinuses and mastoid air cells are clear. Visualized portions of the globes and intraorbital fat are unremarkable. Other: None. IMPRESSION: 1. Stable.  No acute intracranial abnormality. 2. Atrophy with chronic small vessel white matter ischemic disease. Electronically Signed   By: Misty Stanley M.D.   On: 01/24/2018 17:51   Ct Angio Chest Pe W And/or Wo Contrast  Result Date: 01/31/2018 CLINICAL DATA:  Shortness of breath EXAM: CT ANGIOGRAPHY CHEST WITH CONTRAST TECHNIQUE: Multidetector CT imaging of the chest was performed using the standard protocol during bolus administration of intravenous contrast. Multiplanar CT image reconstructions and MIPs were obtained to evaluate the vascular anatomy. CONTRAST:  151mL ISOVUE-370 IOPAMIDOL (ISOVUE-370) INJECTION 76% COMPARISON:  None. FINDINGS: Cardiovascular: Heart is mildly enlarged. Aorta is normal caliber. Moderate coronary artery calcifications and aortic calcifications. No filling defects in the pulmonary arteries to suggest pulmonary emboli. Mediastinum/Nodes: No mediastinal, hilar, or axillary adenopathy. Lungs/Pleura: Predominantly linear densities in both lower lobes most compatible with atelectasis. Some tree-in-bud ground-glass nodular densities are noted in the superior segment of the right lower lobe which could reflect early alveolitis/small airways disease. No effusions. Upper Abdomen: Imaging into the upper abdomen shows no acute findings. Musculoskeletal: Chest wall soft tissues are unremarkable. No acute bony abnormality.  Review of the MIP images  confirms the above findings. IMPRESSION: No evidence of pulmonary embolus. Cardiomegaly, coronary artery disease. Bilateral lower lobe atelectasis. Tree-in-bud nodular densities in the superior segment of the right lower lobe could reflect small airways disease/alveolitis. Aortic Atherosclerosis (ICD10-I70.0). Electronically Signed   By: Rolm Baptise M.D.   On: 01/31/2018 18:56   Dg Chest Port 1 View  Result Date: 02/01/2018 CLINICAL DATA:  84 year old male with a history of aspiration EXAM: PORTABLE CHEST 1 VIEW COMPARISON:  01/31/2018, 01/17/2018 FINDINGS: Cardiomediastinal silhouette unchanged in size and contour. Low lung volumes accentuate the interstitium. Mild interlobular septal thickening and fullness in the central vasculature. No large pleural effusion or pneumothorax. No confluent airspace disease. No acute displaced fracture IMPRESSION: Low lung volumes with interlobular/interstitial opacities, potentially representing mild pulmonary edema, atelectasis/scarring. Electronically Signed   By: Corrie Mckusick D.O.   On: 02/01/2018 09:04   Dg Chest Port 1 View  Result Date: 01/31/2018 CLINICAL DATA:  Shortness of breath EXAM: PORTABLE CHEST 1 VIEW COMPARISON:  01/27/2018 FINDINGS: There is improved aeration of the lungs. No pulmonary edema or focal consolidation. Unchanged mild cardiomegaly. No pleural effusion or pneumothorax. IMPRESSION: Improved aeration without focal airspace disease. Electronically Signed   By: Ulyses Jarred M.D.   On: 01/31/2018 17:38   Dg Chest Port 1 View  Result Date: 01/27/2018 CLINICAL DATA:  Shortness of breath on exertion EXAM: PORTABLE CHEST 1 VIEW COMPARISON:  01/24/2018 FINDINGS: Cardiomegaly with vascular congestion. Increasing interstitial prominence in the lungs could reflect interstitial edema. Bibasilar atelectasis. No visible effusions or acute bony abnormality. IMPRESSION: Cardiomegaly with vascular congestion and interstitial prominence, likely  interstitial edema. Bibasilar atelectasis. Electronically Signed   By: Rolm Baptise M.D.   On: 01/27/2018 09:44   Dg Chest Port 1 View  Result Date: 01/24/2018 CLINICAL DATA:  Shortness of breath and weakness. Altered mental status. EXAM: PORTABLE CHEST 1 VIEW COMPARISON:  10/21/2017 FINDINGS: The heart is borderline enlarged but stable. There is tortuosity and calcification of the thoracic aorta. Chronic bronchitic type lung changes but no infiltrates or effusions. IMPRESSION: Mild stable cardiac enlargement but no acute pulmonary findings. Electronically Signed   By: Marijo Sanes M.D.   On: 01/24/2018 17:35     LOS: 0 days   Oren Binet, MD  [facility]  If 7PM-7AM, please contact night-coverage  Please page via [email or web address]-Password [email or web address]-click on MD name and type text message  02/01/2018, 11:49 AM

## 2018-02-01 NOTE — Progress Notes (Signed)
Pharmacy Antibiotic Note  Charles Friedman is a 84 y.o. male admitted on 01/31/2018 with aspiration pneumonia.  Pharmacy has been consulted for Zosyn dosing.  Plan: Zosyn 3.375g IV q8h (4-hour infusion).   Temp (24hrs), Avg:98.3 F (36.8 C), Min:97.9 F (36.6 C), Max:98.6 F (37 C)  Recent Labs  Lab 01/26/18 0454 01/27/18 0526 01/28/18 0415 01/31/18 1706 01/31/18 1716 01/31/18 1743 02/01/18 0245  WBC 8.2 8.4 7.8 12.1*  --   --  10.4  CREATININE 1.39* 1.22 1.26*  --  1.11 1.10 1.14  LATICACIDVEN  --   --   --   --   --  1.55  --     Estimated Creatinine Clearance: 64 mL/min (by C-G formula based on SCr of 1.14 mg/dL).    Allergies  Allergen Reactions  . Aureomycin [Chlortetracycline] Itching and Rash    Reaction approximately 1954     Thank you for allowing pharmacy to be a part of this patient's care.  Wynona Neat, PharmD, BCPS  02/01/2018 7:58 AM

## 2018-02-01 NOTE — Consult Note (Signed)
[place] Nurse wound consult note Patient receiving care in [facility] 2W06.  Private Caregiver, Danae Chen, at the bedside providing the details of the patient's recent history and evolution of wounds. Reason for Consult: Recommendations for the left heel.  While present to eval this wound, I learned of the other wounds and evaluated those as well. Wound types: Left foot wounds include the following:  The great toe tip is discolored maroon; there is no open area to the toe.  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  There is a fluid filled bulla on the plantar surface of the left foot from metatarsal heads 2 through 5.  This likely represents a stage 2 PI.  This wound measures 5 cm x 5 cm.  The left heel is discolored maroon, and there is clear evidence that this area represents a DTPI, which today, has a portion of the overlying skin missing and the fluid has drained out.  This area measures 5.2 cm x 7 cm.  The right heel has an unstageable wound that is 100% yellow, soft slough.  It measures 1 cm x 1.3 cm.  The buttocks has an area of discoloration that blanches very poorly, or not at all.  It is a light brown color with areas of purple.  The caregiver states when the patient was at home, he spent a lot of time sitting in his recliner.  I suspect the discolored area that measures 22 cm x 16 cm likely represents Chronic Tissue Injury (CTI), commonly referred to as "recliner butt".  However, the purple discoloration is concerning for evolution to DTPI segments.  On the left buttock there is an open area that measures 2.5 cm x 2 cm.  On the right buttock there is an open area that measures 4.5 cm x 5 cm.  Both of these are superficial and have surrounding macerated tissue.  I suspect these are related to MASD, friction, and shear. Dressing procedure/placement/frequency: Betadine to all areas on left foot, allow to air dry, gently wrap in kerlex. Perform each shift.  Santyl and saline moistened gauze to right heel. Prevalon boots. Air  mattress, turning every 2 hours, avoid the supine position.  40% Desitin on buttocks and leave open to air. Use ONLY ONE DermaTherapy pad beneath patient's buttocks. Do NOT use disposable pads. Only use one underpad (DermaTherapy or DryFlow) beneath a patient if they are on a mattress replacement with low air loss feature.  Monitor the wound area(s) for worsening of condition such as: Signs/symptoms of infection,  Increase in size,  Development of or worsening of odor, Development of pain, or increased pain at the affected locations.  Notify the medical team if any of these develop.  Thank you for the consult.  Discussed plan of care with the patient and bedside nurse.  [place] nurse will not follow at this time.  Please re-consult the [place] team if needed.  Val Riles, RN, MSN, CWOCN, CNS-BC, pager [phone]

## 2018-02-01 NOTE — Evaluation (Signed)
Clinical/Bedside Swallow Evaluation Patient Details  Name: Charles Friedman MRN: [id number] Date of Birth: 1932/06/09  Today's Date: 02/01/2018 Time: SLP Start Time (ACUTE ONLY): 1218 SLP Stop Time (ACUTE ONLY): 1240 SLP Time Calculation (min) (ACUTE ONLY): 22 min  Past Medical History:  Past Medical History:  Diagnosis Date  . Atrial fibrillation ([place]) 2003   Initially on Coumadin, now on Xarelto  . Cataract    Bilateral cataracts.  . Diabetes mellitus without complication ([place])    type II  . Diabetic peripheral neuropathy associated with type 2 diabetes mellitus ([place])   . Glaucoma    left eye  . Hx of radiation therapy 08/15/13- 10/06/13   prostate 7600 cGy 38 sessions  . Hyperlipidemia with target LDL less than 70   . Neuropathy    diabetic, in feet  . Personal history of fall 09/26/13   front entrance of [place]  . Prostate cancer [facility])    History of radiation therapy   Past Surgical History:  Past Surgical History:  Procedure Laterality Date  . CAROTID DOPPLERS  11/2015   Mild plaque bilaterally.  Less than 40%.  Antegrade vertebral flow.  Marland Kitchen PROSTATE BIOPSY  12/25/11   Adenocarcinoma, gleason 6  . TRANSTHORACIC ECHOCARDIOGRAM  11/2015   In setting of CVA: Technically difficult.  Normal LV size and function.  EF 55-60%.  No regional wall motion normality.  Moderate LA dilation, mild RA dilation.  Mildly increased PA pressures.  . TRANSTHORACIC ECHOCARDIOGRAM  10/20/2017   Low normal LV function.  EF 50 to 55%.  No R WMA.  Moderate LA dilation.  Mild to moderate RV dilation with mildly reduced function -however no significant tricuspid regurgitation noted.  Mild R RA dilation.     HPI:  Patient is an 84 y.o. male presenting with worsening weakness, chest congestion, and shortness of breath. MD noted audible secretions and is concerned about aspiration PNA; tree-in-bud densities noted in RLL. Esophagram in 2014 showed moderate to severe GER, feline esophagus, moderate hiatal hernia,  and mild esophageal dysmotility. PMH includes afib, CHF, CVA, DM, FTT   Assessment / Plan / Recommendation Clinical Impression  Pt's oropharyngeal swallow appears grossly functional considering lack of dentition, for which pt uses extra time and liquid washes to facilitate oral clearance. His family member says that he can sometimes get distracted during meals, especially this week while he has been acutely ill, but that he typically has no difficulty swallowing (and has not had PNA before). She has been ordering meals for him that she believes would be soft enough to chew, and does not think he needs a modified diet to accomodate his dentition. Recommend continuing current diet with meal selection favoring softer items. SLP to f/u briefly for tolerance given that his mentation is not back to baseline per family report. SLP Visit Diagnosis: Dysphagia, unspecified (R13.10)    Aspiration Risk  Mild aspiration risk    Diet Recommendation Regular;Thin liquid   Liquid Administration via: Cup;Straw Medication Administration: Whole meds with liquid Supervision: Patient able to self feed;Full supervision/cueing for compensatory strategies Compensations: Slow rate;Small sips/bites;Minimize environmental distractions Postural Changes: Seated upright at 90 degrees;Remain upright for at least 30 minutes after po intake    Other  Recommendations Oral Care Recommendations: Oral care BID   Follow up Recommendations 24 hour supervision/assistance      Frequency and Duration min 1 x/week  1 week       Prognosis Prognosis for Safe Diet Advancement: Good  Swallow Study   General HPI: Patient is an 84 y.o. male presenting with worsening weakness, chest congestion, and shortness of breath. MD noted audible secretions and is concerned about aspiration PNA; tree-in-bud densities noted in RLL. Esophagram in 2014 showed moderate to severe GER, feline esophagus, moderate hiatal hernia, and mild esophageal  dysmotility. PMH includes afib, CHF, CVA, DM, FTT Type of Study: Bedside Swallow Evaluation Previous Swallow Assessment: none in chart, none per dtr Diet Prior to this Study: Regular;Thin liquids Temperature Spikes Noted: No Respiratory Status: Nasal cannula History of Recent Intubation: No Behavior/Cognition: Alert;Cooperative;Pleasant mood;Requires cueing Oral Cavity Assessment: Within Functional Limits Oral Care Completed by SLP: No Oral Cavity - Dentition: Poor condition;Missing dentition Vision: Functional for self-feeding Self-Feeding Abilities: Able to feed self Patient Positioning: Upright in bed Baseline Vocal Quality: Normal Volitional Cough: Strong Volitional Swallow: Unable to elicit    Oral/Motor/Sensory Function Overall Oral Motor/Sensory Function: Within functional limits   Ice Chips Ice chips: Not tested   Thin Liquid Thin Liquid: Within functional limits Presentation: Cup;Self Fed;Straw    Nectar Thick Nectar Thick Liquid: Not tested   Honey Thick Honey Thick Liquid: Not tested   Puree Puree: Within functional limits Presentation: Self Fed;Spoon   Solid     Solid: Within functional limits Presentation: Self [place] 02/01/2018,2:40 PM  [name], M.A. [place] [facility] Environmental education officer [phone] Office [phone]

## 2018-02-02 ENCOUNTER — Other Ambulatory Visit: Payer: Medicare Other

## 2018-02-02 ENCOUNTER — Other Ambulatory Visit: Payer: Self-pay

## 2018-02-02 ENCOUNTER — Inpatient Hospital Stay (HOSPITAL_COMMUNITY): Payer: Medicare Other

## 2018-02-02 ENCOUNTER — Encounter (HOSPITAL_COMMUNITY): Payer: Self-pay

## 2018-02-02 DIAGNOSIS — R740 Nonspecific elevation of levels of transaminase and lactic acid dehydrogenase [LDH]: Secondary | ICD-10-CM

## 2018-02-02 LAB — CBC
HCT: 39.8 % (ref 39.0–52.0)
Hemoglobin: 12.9 g/dL — ABNORMAL LOW (ref 13.0–17.0)
MCH: 29.2 pg (ref 26.0–34.0)
MCHC: 32.4 g/dL (ref 30.0–36.0)
MCV: 90 fL (ref 80.0–100.0)
Platelets: 273 10*3/uL (ref 150–400)
RBC: 4.42 MIL/uL (ref 4.22–5.81)
RDW: 13.3 % (ref 11.5–15.5)
WBC: 11.9 10*3/uL — ABNORMAL HIGH (ref 4.0–10.5)
nRBC: 0 % (ref 0.0–0.2)

## 2018-02-02 LAB — BASIC METABOLIC PANEL
Anion gap: 11 (ref 5–15)
BUN: 54 mg/dL — ABNORMAL HIGH (ref 8–23)
CO2: 31 mmol/L (ref 22–32)
Calcium: 8.7 mg/dL — ABNORMAL LOW (ref 8.9–10.3)
Chloride: 97 mmol/L — ABNORMAL LOW (ref 98–111)
Creatinine, Ser: 1.35 mg/dL — ABNORMAL HIGH (ref 0.61–1.24)
GFR calc Af Amer: 56 mL/min — ABNORMAL LOW (ref >60)
GFR calc non Af Amer: 48 mL/min — ABNORMAL LOW (ref >60)
Glucose, Bld: 211 mg/dL — ABNORMAL HIGH (ref 70–99)
Potassium: 3.7 mmol/L (ref 3.5–5.1)
Sodium: 139 mmol/L (ref 135–145)

## 2018-02-02 LAB — GLUCOSE, CAPILLARY
Glucose-Capillary: 192 mg/dL — ABNORMAL HIGH (ref 70–99)
Glucose-Capillary: 201 mg/dL — ABNORMAL HIGH (ref 70–99)
Glucose-Capillary: 192 mg/dL — ABNORMAL HIGH (ref 70–99)
Glucose-Capillary: 165 mg/dL — ABNORMAL HIGH (ref 70–99)

## 2018-02-02 LAB — MRSA PCR SCREENING: MRSA by PCR: POSITIVE — AB

## 2018-02-02 MED ORDER — INSULIN ASPART 100 UNIT/ML ~~LOC~~ SOLN
0.0000 [IU] | Freq: Three times a day (TID) | SUBCUTANEOUS | Status: DC
  Administered 2018-02-03: 3 [IU] via SUBCUTANEOUS

## 2018-02-02 MED ORDER — MUPIROCIN 2 % EX OINT
1.0000 "application " | TOPICAL_OINTMENT | Freq: Two times a day (BID) | CUTANEOUS | Status: AC
  Administered 2018-02-02 – 2018-02-06 (×10): 1 via NASAL
  Filled 2018-02-02 (×4): qty 22

## 2018-02-02 MED ORDER — SODIUM CHLORIDE 0.9 % IV SOLN
3.0000 g | Freq: Four times a day (QID) | INTRAVENOUS | Status: DC
  Administered 2018-02-02 – 2018-02-04 (×8): 3 g via INTRAVENOUS
  Filled 2018-02-02 (×9): qty 3

## 2018-02-02 MED ORDER — ORAL CARE MOUTH RINSE
15.0000 mL | Freq: Two times a day (BID) | OROMUCOSAL | Status: DC
  Administered 2018-02-02 – 2018-02-07 (×9): 15 mL via OROMUCOSAL

## 2018-02-02 MED ORDER — CHLORHEXIDINE GLUCONATE CLOTH 2 % EX PADS
6.0000 | MEDICATED_PAD | Freq: Every day | CUTANEOUS | Status: AC
  Administered 2018-02-02 – 2018-02-06 (×5): 6 via TOPICAL

## 2018-02-02 MED ORDER — AZITHROMYCIN 250 MG PO TABS: 500.0000 mg | ORAL_TABLET | Freq: Every day | ORAL | Status: DC

## 2018-02-02 MED ORDER — INSULIN ASPART 100 UNIT/ML ~~LOC~~ SOLN
3.0000 [IU] | Freq: Three times a day (TID) | SUBCUTANEOUS | Status: DC
  Administered 2018-02-03: 3 [IU] via SUBCUTANEOUS

## 2018-02-02 NOTE — Progress Notes (Signed)
Pharmacy Antibiotic Note  Charles Friedman is a 84 y.o. male admitted on 01/31/2018 with aspiration pneumonia. He was started on zosyn. Cultures remained neg. Resp panel also neg. D/w Dr. Sloan Leiter, narrow to Unasyn.   Plan: Unasyn 3g IV q6   Temp (24hrs), Avg:97.6 F (36.4 C), Min:97.4 F (36.3 C), Max:97.7 F (36.5 C)  Recent Labs  Lab 01/27/18 0526 01/28/18 0415 01/31/18 1706 01/31/18 1716 01/31/18 1743 02/01/18 0245 02/02/18 0229  WBC 8.4 7.8 12.1*  --   --  10.4 11.9*  CREATININE 1.22 1.26*  --  1.11 1.10 1.14 1.35*  LATICACIDVEN  --   --   --   --  1.55  --   --     Estimated Creatinine Clearance: 52.8 mL/min (A) (by C-G formula based on SCr of 1.35 mg/dL (H)).    Allergies  Allergen Reactions  . Aureomycin [Chlortetracycline] Itching and Rash    Reaction approximately 1956    Abx:  12/16 zosyn>>12/17 12/16 azith>>12/17 12/17 Unasyn>>  Onnie Boer, PharmD, BCIDP, AAHIVP, CPP Infectious Disease Pharmacist 02/02/2018 10:14 AM

## 2018-02-02 NOTE — Consult Note (Signed)
   [facility] CM Inpatient Consult   02/02/2018  Charles Friedman [birth year] [id number]     Patient screened for potential [facility] Care Management services due to unplanned readmission risk score of 30% (extreme) and multiple hospitalizations.  Chart reviewed. Patient is from SNF with therapy recommendations to return to SNF.   Will continue to follow for progression and disposition plans. Will engage for [place] Management if appropriate.   Marthenia Rolling, MSN-Ed, RN,BSN [facility] Liaison [phone]

## 2018-02-02 NOTE — Progress Notes (Signed)
PHARMACIST - PHYSICIAN COMMUNICATION  DR:   Sloan Leiter  CONCERNING: IV to Oral Route Change Policy  RECOMMENDATION: This patient is receiving azithromycin by the intravenous route.  Based on criteria approved by the Pharmacy and Therapeutics Committee, the intravenous medication(s) is/are being converted to the equivalent oral dose form(s).   DESCRIPTION: These criteria include:  The patient is eating (either orally or via tube) and/or has been taking other orally administered medications for a least 24 hours  The patient has no evidence of active gastrointestinal bleeding or impaired GI absorption (gastrectomy, short bowel, patient on TNA or NPO).  If you have questions about this conversion, please contact the Pharmacy Department  []   [phone] )  [facility] []   [phone] )  [facility] [x]   [phone] )  [facility] []   [phone] )  [facility] []   [phone] )  [place], PharmD, [place], AAHIVP, CPP Infectious Disease Pharmacist 02/02/2018 8:15 AM

## 2018-02-02 NOTE — Clinical Social Work Note (Signed)
Clinical Social Work Assessment  Patient Details  Name: Charles Friedman MRN: [id number] Date of Birth: [birth year]  Date of referral:  02/02/18               Reason for consult:  Facility Placement                Permission sought to share information with:  Family Supports, Chartered certified accountant granted to share information::  Yes, Verbal Permission Granted  Name::     Network engineer::  [facility]  Relationship::  Son  Sport and exercise psychologist Information:     Housing/Transportation Living arrangements for the past 2 months:  [place] of Information:  Patient Patient Interpreter Needed:  None Criminal Activity/Legal Involvement Pertinent to Current Situation/Hospitalization:  No - Comment as needed Significant Relationships:  Adult Children Lives with:  Self Do you feel safe going back to the place where you live?  No Need for family participation in patient care:  No (Coment)  Care giving concerns:  Pt is alert and oriented. NO family or friends present at bedside.   Social Worker assessment / plan:  CSW spoke with pt at bedside. Pt was at [facility] for STR prior to admission. Pt is agreeable to return to [facility] at d/c. Pt states he lives alone and plan is for him to return home after completing rehab. Pt is agreeable to CSW contacting his son if needed. CSW to follow up with facility.  Employment status:  Retired Forensic scientist:  Medicare PT Recommendations:  [place] / Referral to community resources:  [place]  Patient/Family's Response to care:  Pt verbalized understanding of CSW role and expressed appreciation for support. Pt denies any concern regarding pt care at this time.   Patient/Family's Understanding of and Emotional Response to Diagnosis, Current Treatment, and Prognosis:  Pt understanding and realistic regarding physical limitations. Pt understands the need for SNF placement at d/c. Pt  agreeable to SNF placement at d/c, at this time. Pt's responses emotionally appropriate during conversation with CSW. Pt denies any concern regarding treatment plan at this time. CSW will continue to provide support and facilitate d/c needs.   Emotional Assessment Appearance:  Appears stated age Attitude/Demeanor/Rapport:  (Pt was appropriate) Affect (typically observed):  Accepting, Appropriate, Calm Orientation:  Oriented to Self, Oriented to Place, Oriented to  Time, Oriented to Situation Alcohol / Substance use:  Not Applicable Psych involvement (Current and /or in the community):  No (Comment)  Discharge Needs  Concerns to be addressed:  Care Coordination, Basic Needs Readmission within the last 30 days:  Yes Current discharge risk:  Dependent with Mobility Barriers to Discharge:  Continued Medical Work up   [employer], LCSW 02/02/2018, 2:06 PM

## 2018-02-02 NOTE — Progress Notes (Addendum)
PROGRESS NOTE        PATIENT DETAILS Name: Charles Friedman Age: 84 y.o. Sex: male Date of Birth: [birth year] Admit Date: 01/31/2018 Admitting Physician Vilma Prader, MD FOY:DXAJOIN, Fransico Him, MD  Brief Narrative: Patient is a 84 y.o. male history of paroxysmal atrial fibrillation on anticoagulation, chronic diastolic heart failure, history of CVA, insulin-dependent diabetes-failure to thrive syndrome-just discharged from this hospital on 12/8-presenting with worsening weakness, chest congestion, shortness of breath-with some suggestion for aspiration pneumonia-and admitted to the hospitalist service.  See below for further details  Subjective: No longer gurgling/accumulating secretions like yesterday.  He is awake-appears very frail and deconditioned.  Acknowledges breathing is somewhat better than yesterday.    Assessment/Plan: Acute hypoxic respiratory failure: Although no obvious infiltrate seen on imaging studies-given patient was accumulating secretions/gurgling on 12/16-and sounded very congested-high suspicion for microaspiration.  Has improved following pulmonary toileting, bronchodilators and empiric Zosyn.  We will narrow antibiotics to just Unasyn.  Appreciate speech therapy evaluation.  Continue attempts with flutter valve/incentive spirometry etc.  Titrate off oxygen as tolerated.  CT chest was negative for pulmonary embolism.   Chronic diastolic heart failure: He does not appear to be volume overloaded-however given hypoxia/respiratory failure-he was given IV Lasix empirically-but since he has developed mild AKI-we will stop diuretics today.    Mild AKI: Likely secondary to Lasix-hold Lasix today-recheck electrolytes tomorrow.  Transaminitis: Per family-patient's PCP was planning to a RUQ Mellody Dance has been ordered while he has been hospitalized-we will recheck LFTs tomorrow.  For now cautiously continue with statin.  Paroxysmal atrial  fibrillation: Rate controlled with metoprolol-continue Xarelto.    DM-2: CBG stable-continue Lantus 15 units daily, 3 units of NovoLog with meals and SSI.  Follow and adjust accordingly.   BPH: Continue Flomax.  Failure to thrive syndrome/acute on chronic debility: Per H&P-patient has been gradually declining over the past few months-worsening debility likely secondary to acute illness.  Await PT/OT evaluation.   Chronic left foot wound/right heel wound-unstageable:: Present prior to admission-appreciate wound care evaluation.  DVT Prophylaxis: Full dose anticoagulation with Xarelto  Code Status:  DNR  Family Communication: None at bedside  Disposition Plan: Remain inpatient- back to SNF on discharge over the next few days  Antimicrobial agents: Anti-infectives (From admission, onward)   Start     Dose/Rate Route Frequency Ordered Stop   02/03/18 1000  azithromycin (ZITHROMAX) tablet 500 mg  Status:  Discontinued     500 mg Oral Daily 02/02/18 0813 02/02/18 1008   02/02/18 1100  Ampicillin-Sulbactam (UNASYN) 3 g in sodium chloride 0.9 % 100 mL IVPB     3 g 200 mL/hr over 30 Minutes Intravenous Every 6 hours 02/02/18 1010     02/01/18 1400  piperacillin-tazobactam (ZOSYN) IVPB 3.375 g  Status:  Discontinued     3.375 g 12.5 mL/hr over 240 Minutes Intravenous Every 8 hours 02/01/18 0759 02/02/18 1008   02/01/18 0800  piperacillin-tazobactam (ZOSYN) IVPB 3.375 g     3.375 g 100 mL/hr over 30 Minutes Intravenous  Once 02/01/18 0759 02/01/18 1042   02/01/18 0745  azithromycin (ZITHROMAX) 500 mg in sodium chloride 0.9 % 250 mL IVPB  Status:  Discontinued     500 mg 250 mL/hr over 60 Minutes Intravenous Every 24 hours 02/01/18 0734 02/02/18 0813      Procedures: None  CONSULTS:  None  Time spent: 25 minutes-Greater than 50% of this time was spent in counseling, explanation of diagnosis, planning of further management, and coordination of care.  MEDICATIONS: Scheduled  Meds: . atorvastatin  40 mg Oral q1800  . collagenase   Topical Daily  . escitalopram  5 mg Oral Daily  . guaiFENesin  600 mg Oral BID  . insulin aspart  0-9 Units Subcutaneous TID WC  . insulin aspart  3 Units Subcutaneous TID WC  . insulin glargine  15 Units Subcutaneous Daily  . ipratropium-albuterol  3 mL Nebulization TID  . liver oil-zinc oxide   Topical QID  . mouth rinse  15 mL Mouth Rinse BID  . metoprolol tartrate  12.5 mg Oral BID  . polyethylene glycol  17 g Oral BID  . rivaroxaban  20 mg Oral Q supper  . senna-docusate  1 tablet Oral BID  . sodium chloride flush  3 mL Intravenous Q12H  . tamsulosin  0.4 mg Oral Daily   Continuous Infusions: . ampicillin-sulbactam (UNASYN) IV     PRN Meds:.   PHYSICAL EXAM: Vital signs: Vitals:   02/01/18 2341 02/02/18 0500 02/02/18 0816 02/02/18 0913  BP: 106/62  133/65   Pulse: 97  97   Resp: 12  (!) 28   Temp: 97.7 F (36.5 C)  (!) 97.5 F (36.4 C)   TempSrc: Oral  Oral   SpO2: 92%  94% 95%  Weight:  108.9 kg    Height:  6' (1.829 m)     Filed Weights   02/02/18 0500  Weight: 108.9 kg   Body mass index is 32.55 kg/m.   General appearance: Awake, alert, very frail Eyes:no scleral icterus. HEENT: Atraumatic and Normocephalic Neck: supple, no JVD. Resp:Good air entry bilaterally, no transmitted sounds today. CVS: S1 S2 regular GI: Bowel sounds present, Non tender and not distended with no gaurding, rigidity or rebound. Extremities: B/L Lower Ext shows no edema, both legs are warm to touch Neurology:  Non focal-generalized weakness Musculoskeletal:No digital cyanosis Skin:No Rash, warm and dry Wounds:N/A  I have personally reviewed following labs and imaging studies  LABORATORY DATA: CBC: Recent Labs  Lab 01/27/18 0526 01/28/18 0415 01/31/18 1706 01/31/18 1743 02/01/18 0245 02/02/18 0229  WBC 8.4 7.8 12.1*  --  10.4 11.9*  NEUTROABS  --   --  9.7*  --   --   --   HGB 13.1 13.4 14.4 15.3 13.8 12.9*    HCT 41.1 41.8 45.4 45.0 44.5 39.8  MCV 89.5 89.1 91.0  --  91.0 90.0  PLT 274 286 293  --  296 425    Basic Metabolic Panel: Recent Labs  Lab 01/27/18 0526 01/28/18 0415 01/31/18 1716 01/31/18 1743 02/01/18 0245 02/02/18 0229  NA 138 139 138 140 141 139  K 3.7 3.8 4.2 4.1 4.8 3.7  CL 100 99 99 99 99 97*  CO2 29 29 32  --  29 31  GLUCOSE 127* 126* 180* 176* 236* 211*  BUN 52* 50* 47* 46* 47* 54*  CREATININE 1.22 1.26* 1.11 1.10 1.14 1.35*  CALCIUM 8.7* 8.9 8.7*  --  8.8* 8.7*  MG 2.1 2.1  --   --   --   --     GFR: Estimated Creatinine Clearance: 52.8 mL/min (A) (by C-G formula based on SCr of 1.35 mg/dL (H)).  Liver Function Tests: Recent Labs  Lab 01/27/18 0526 01/31/18 1716  AST 122* 120*  ALT 89* 101*  ALKPHOS 102 144*  BILITOT 1.1  1.0  PROT 6.2* 7.4  ALBUMIN 2.5* 2.8*   No results for input(s): LIPASE, AMYLASE in the last 168 hours. Recent Labs  Lab 01/31/18 1716  AMMONIA 15    Coagulation Profile: Recent Labs  Lab 01/31/18 1716  INR 1.24    Cardiac Enzymes: No results for input(s): CKTOTAL, CKMB, CKMBINDEX, TROPONINI in the last 168 hours.  BNP (last 3 results) No results for input(s): PROBNP in the last 8760 hours.  HbA1C: No results for input(s): HGBA1C in the last 72 hours.  CBG: Recent Labs  Lab 02/01/18 0757 02/01/18 1125 02/01/18 1709 02/01/18 2206 02/02/18 0752  GLUCAP 237* 260* 283* 212* 192*    Lipid Profile: No results for input(s): CHOL, HDL, LDLCALC, TRIG, CHOLHDL, LDLDIRECT in the last 72 hours.  Thyroid Function Tests: Recent Labs    01/31/18 1716 01/31/18 1717  TSH  --  1.828  T4TOTAL 6.1  --     Anemia Panel: No results for input(s): VITAMINB12, FOLATE, FERRITIN, TIBC, IRON, RETICCTPCT in the last 72 hours.  Urine analysis:    Component Value Date/Time   COLORURINE YELLOW 02/01/2018 [place] 02/01/2018 1014   LABSPEC 1.015 02/01/2018 1014   PHURINE 5.0 02/01/2018 1014   GLUCOSEU  NEGATIVE 02/01/2018 1014   HGBUR MODERATE (A) 02/01/2018 1014   BILIRUBINUR NEGATIVE 02/01/2018 1014   KETONESUR NEGATIVE 02/01/2018 1014   PROTEINUR NEGATIVE 02/01/2018 1014   UROBILINOGEN 0.2 07/15/2014 2038   NITRITE NEGATIVE 02/01/2018 1014   LEUKOCYTESUR SMALL (A) 02/01/2018 1014    Sepsis Labs: Lactic Acid, Venous    Component Value Date/Time   LATICACIDVEN 1.55 01/31/2018 1743    MICROBIOLOGY: Recent Results (from the past 240 hour(s))  Culture, blood (routine x 2)     Status: None   Collection Time: 01/24/18  5:12 PM  Result Value Ref Range Status   Specimen Description BLOOD BLOOD RIGHT FOREARM  Final   Special Requests   Final    BOTTLES DRAWN AEROBIC ONLY Blood Culture adequate volume   Culture   Final    NO GROWTH 5 DAYS Performed at [facility], [place] [address] [place], [place] [address]    Report Status 01/29/2018 FINAL  Final  Culture, blood (routine x 2)     Status: None   Collection Time: 01/24/18  5:12 PM  Result Value Ref Range Status   Specimen Description BLOOD BLOOD RIGHT WRIST  Final   Special Requests   Final    BOTTLES DRAWN AEROBIC AND ANAEROBIC Blood Culture adequate volume   Culture   Final    NO GROWTH 5 DAYS Performed at [facility], [place] [address], [place], [place] [address]    Report Status 01/29/2018 FINAL  Final  Urine culture     Status: None   Collection Time: 01/24/18  5:12 PM  Result Value Ref Range Status   Specimen Description URINE, RANDOM  Final   Special Requests NONE  Final   Culture   Final    NO GROWTH Performed at [facility], [place] [address], [place], [place] [address]    Report Status 01/25/2018 FINAL  Final  Blood culture (routine x 2)     Status: None (Preliminary result)   Collection Time: 01/31/18  5:07 PM  Result Value Ref Range Status   Specimen Description BLOOD LEFT ANTECUBITAL  Final   Special Requests   Final    BOTTLES DRAWN AEROBIC AND ANAEROBIC Blood Culture adequate volume  Culture   Final    NO GROWTH 2 DAYS Performed at [facility], [place] [address] [place], [place] [address]    Report Status PENDING  Incomplete  Blood culture (routine x 2)     Status: None (Preliminary result)   Collection Time: 01/31/18  5:12 PM  Result Value Ref Range Status   Specimen Description BLOOD SITE NOT SPECIFIED  Final   Special Requests   Final    BOTTLES DRAWN AEROBIC AND ANAEROBIC Blood Culture adequate volume   Culture   Final    NO GROWTH 2 DAYS Performed at [facility], [place] [address] [place], [place] [address]    Report Status PENDING  Incomplete  Respiratory Panel by PCR     Status: None   Collection Time: 02/01/18  1:13 AM  Result Value Ref Range Status   Adenovirus NOT DETECTED NOT DETECTED Final   Coronavirus 229E NOT DETECTED NOT DETECTED Final   Coronavirus HKU1 NOT DETECTED NOT DETECTED Final   Coronavirus NL63 NOT DETECTED NOT DETECTED Final   Coronavirus OC43 NOT DETECTED NOT DETECTED Final   Metapneumovirus NOT DETECTED NOT DETECTED Final   Rhinovirus / Enterovirus NOT DETECTED NOT DETECTED Final   Influenza A NOT DETECTED NOT DETECTED Final   Influenza B NOT DETECTED NOT DETECTED Final   Parainfluenza Virus 1 NOT DETECTED NOT DETECTED Final   Parainfluenza Virus 2 NOT DETECTED NOT DETECTED Final   Parainfluenza Virus 3 NOT DETECTED NOT DETECTED Final   Parainfluenza Virus 4 NOT DETECTED NOT DETECTED Final   Respiratory Syncytial Virus NOT DETECTED NOT DETECTED Final   Bordetella pertussis NOT DETECTED NOT DETECTED Final   Chlamydophila pneumoniae NOT DETECTED NOT DETECTED Final   Mycoplasma pneumoniae NOT DETECTED NOT DETECTED Final    Comment: Performed at [facility], [place] [address], [place], [place] [address]    RADIOLOGY STUDIES/RESULTS: Ct Head Wo Contrast  Result Date: 01/24/2018 CLINICAL DATA:  Altered mental status. EXAM: CT HEAD WITHOUT CONTRAST TECHNIQUE: Contiguous axial images were obtained from the base of the  skull through the vertex without intravenous contrast. COMPARISON:  10/20/2017 FINDINGS: Brain: There is no evidence for acute hemorrhage, hydrocephalus, mass lesion, or abnormal extra-axial fluid collection. No definite CT evidence for acute infarction. Diffuse loss of parenchymal volume is consistent with atrophy. Patchy low attenuation in the deep hemispheric and periventricular white matter is nonspecific, but likely reflects chronic microvascular ischemic demyelination. Vascular: No hyperdense vessel or unexpected calcification. Skull: No evidence for fracture. No worrisome lytic or sclerotic lesion. Sinuses/Orbits: The visualized paranasal sinuses and mastoid air cells are clear. Visualized portions of the globes and intraorbital fat are unremarkable. Other: None. IMPRESSION: 1. Stable.  No acute intracranial abnormality. 2. Atrophy with chronic small vessel white matter ischemic disease. Electronically Signed   By: Misty Stanley M.D.   On: 01/24/2018 17:51   Ct Angio Chest Pe W And/or Wo Contrast  Result Date: 01/31/2018 CLINICAL DATA:  Shortness of breath EXAM: CT ANGIOGRAPHY CHEST WITH CONTRAST TECHNIQUE: Multidetector CT imaging of the chest was performed using the standard protocol during bolus administration of intravenous contrast. Multiplanar CT image reconstructions and MIPs were obtained to evaluate the vascular anatomy. CONTRAST:  154mL ISOVUE-370 IOPAMIDOL (ISOVUE-370) INJECTION 76% COMPARISON:  None. FINDINGS: Cardiovascular: Heart is mildly enlarged. Aorta is normal caliber. Moderate coronary artery calcifications and aortic calcifications. No filling defects in the pulmonary arteries to suggest pulmonary emboli. Mediastinum/Nodes: No mediastinal, hilar, or axillary adenopathy. Lungs/Pleura: Predominantly  linear densities in both lower lobes most compatible with atelectasis. Some tree-in-bud ground-glass nodular densities are noted in the superior segment of the right lower lobe which could  reflect early alveolitis/small airways disease. No effusions. Upper Abdomen: Imaging into the upper abdomen shows no acute findings. Musculoskeletal: Chest wall soft tissues are unremarkable. No acute bony abnormality. Review of the MIP images confirms the above findings. IMPRESSION: No evidence of pulmonary embolus. Cardiomegaly, coronary artery disease. Bilateral lower lobe atelectasis. Tree-in-bud nodular densities in the superior segment of the right lower lobe could reflect small airways disease/alveolitis. Aortic Atherosclerosis (ICD10-I70.0). Electronically Signed   By: Rolm Baptise M.D.   On: 01/31/2018 18:56   Dg Chest Port 1 View  Result Date: 02/01/2018 CLINICAL DATA:  84 year old male with a history of aspiration EXAM: PORTABLE CHEST 1 VIEW COMPARISON:  01/31/2018, 01/17/2018 FINDINGS: Cardiomediastinal silhouette unchanged in size and contour. Low lung volumes accentuate the interstitium. Mild interlobular septal thickening and fullness in the central vasculature. No large pleural effusion or pneumothorax. No confluent airspace disease. No acute displaced fracture IMPRESSION: Low lung volumes with interlobular/interstitial opacities, potentially representing mild pulmonary edema, atelectasis/scarring. Electronically Signed   By: Corrie Mckusick D.O.   On: 02/01/2018 09:04   Dg Chest Port 1 View  Result Date: 01/31/2018 CLINICAL DATA:  Shortness of breath EXAM: PORTABLE CHEST 1 VIEW COMPARISON:  01/27/2018 FINDINGS: There is improved aeration of the lungs. No pulmonary edema or focal consolidation. Unchanged mild cardiomegaly. No pleural effusion or pneumothorax. IMPRESSION: Improved aeration without focal airspace disease. Electronically Signed   By: Ulyses Jarred M.D.   On: 01/31/2018 17:38   Dg Chest Port 1 View  Result Date: 01/27/2018 CLINICAL DATA:  Shortness of breath on exertion EXAM: PORTABLE CHEST 1 VIEW COMPARISON:  01/24/2018 FINDINGS: Cardiomegaly with vascular congestion.  Increasing interstitial prominence in the lungs could reflect interstitial edema. Bibasilar atelectasis. No visible effusions or acute bony abnormality. IMPRESSION: Cardiomegaly with vascular congestion and interstitial prominence, likely interstitial edema. Bibasilar atelectasis. Electronically Signed   By: Rolm Baptise M.D.   On: 01/27/2018 09:44   Dg Chest Port 1 View  Result Date: 01/24/2018 CLINICAL DATA:  Shortness of breath and weakness. Altered mental status. EXAM: PORTABLE CHEST 1 VIEW COMPARISON:  10/21/2017 FINDINGS: The heart is borderline enlarged but stable. There is tortuosity and calcification of the thoracic aorta. Chronic bronchitic type lung changes but no infiltrates or effusions. IMPRESSION: Mild stable cardiac enlargement but no acute pulmonary findings. Electronically Signed   By: Marijo Sanes M.D.   On: 01/24/2018 17:35     LOS: 1 day   Oren Binet, MD  [facility]  If 7PM-7AM, please contact night-coverage  Please page via [email or web address] [email or web address]-click on MD name and type text message  02/02/2018, 10:38 AM

## 2018-02-02 NOTE — Plan of Care (Signed)
  Problem: Clinical Measurements: Goal: Respiratory complications will improve Outcome: Progressing   

## 2018-02-02 NOTE — NC FL2 (Signed)
MEDICAID FL2 LEVEL OF CARE SCREENING TOOL     IDENTIFICATION  Patient Name: Charles Friedman Birthdate: [birth year] Sex: male Admission Date (Current Location): 01/31/2018  [facility] and [place] Number:  Herbalist and Address:  [facility] [place] [facility], [place] [address], [place], [place] [address]      Provider Number: [id number]  Attending Physician Name and Address:  Jonetta Osgood, MD  Relative Name and Phone Number:  [place], [place], [phone]    Current Level of Care: Hospital Recommended Level of Care: [place] Prior Approval Number:    Date Approved/Denied: 10/22/17 PASRR Number: [id number]  Discharge Plan: SNF    Current Diagnoses: Patient Active Problem List   Diagnosis Date Noted  . Respiratory distress 01/31/2018  . Pressure injury of skin 01/27/2018  . Acute decompensated heart failure ([place]) 01/24/2018  . Renal insufficiency 10/20/2017  . Generalized weakness 10/20/2017  . Recurrent falls 10/20/2017  . Bilateral edema of lower extremity 04/10/2017  . Acute on chronic diastolic CHF (congestive heart failure), NYHA class 2 ([place]) 04/10/2017  . Hyperlipidemia with target LDL less than 100 01/22/2016  . Abnormality of gait 01/22/2016  . History of CVA (cerebrovascular accident) 11/26/2015  . TIA (transient ischemic attack) 11/25/2015  . Cerebral infarction ([place]) 11/25/2015  . Stroke (cerebrum) ([place]) 11/25/2015  . Type 2 diabetes mellitus with hyperglycemia ([place]) 07/15/2014  . UTI (lower urinary tract infection) 07/15/2014  . Fall at home 07/15/2014  . Closed rib fracture 07/15/2014  . Dehydration, mild 07/15/2014  . Diabetes mellitus without complication ([place])   . Hx of radiation therapy   . Persistent atrial fibrillation   . Prostate cancer ([place]) 01/29/2012    Orientation RESPIRATION BLADDER Height & Weight     Self, Place, Situation  O2(Nasal Cannula 1L) Incontinent Weight: 240 lb (108.9 kg) Height:   6' (182.9 cm)  BEHAVIORAL SYMPTOMS/MOOD NEUROLOGICAL BOWEL NUTRITION STATUS      Continent Diet(NPO at this time. )  AMBULATORY STATUS COMMUNICATION OF NEEDS Skin   Extensive Assist Verbally PU Stage and Appropriate Care   PU Stage 2 Dressing: Daily(Heel, guaze dressing)                   Personal Care Assistance Level of Assistance  Bathing, Feeding, Dressing Bathing Assistance: Maximum assistance Feeding assistance: Limited assistance Dressing Assistance: Maximum assistance Total Care Assistance: Maximum assistance   Functional Limitations Info  Sight, Hearing, Speech Sight Info: Adequate Hearing Info: Adequate Speech Info: Adequate    SPECIAL CARE FACTORS FREQUENCY  PT (By licensed PT), OT (By licensed OT)     PT Frequency: 2x OT Frequency: 2x            Contractures Contractures Info: Not present    Additional Factors Info  Code Status, Allergies Code Status Info: DNR Allergies Info: Aureomycin Chlortetracycline   Insulin Sliding Scale Info: insulin aspart (novoLOG) injection 0-9 Units Freq: 3 times daily with meals Route: [place]       Current Medications (02/02/2018):  This is the current hospital active medication list Current Facility-Administered Medications  Medication Dose Route Frequency Provider Last Rate Last Dose  . Ampicillin-Sulbactam (UNASYN) 3 g in sodium chloride 0.9 % 100 mL IVPB  3 g Intravenous Q6H Ghimire, Shanker M, MD 200 mL/hr at 02/02/18 1225 3 g at 02/02/18 1225  . atorvastatin (LIPITOR) tablet 40 mg  40 mg Oral q1800 Vilma Prader, MD   40 mg at 02/01/18 1813  .  Chlorhexidine Gluconate Cloth 2 % PADS 6 each  6 each Topical Q0600 Jonetta Osgood, MD   6 each at 02/02/18 1222  . collagenase (SANTYL) ointment   Topical Daily Ghimire, Shanker M, MD      . escitalopram (LEXAPRO) tablet 5 mg  5 mg Oral Daily Vilma Prader, MD   5 mg at 02/02/18 0929  . guaiFENesin (MUCINEX) 12 hr tablet 600 mg  600 mg Oral BID Jonetta Osgood,  MD   600 mg at 02/02/18 6063  . insulin aspart (novoLOG) injection 0-9 Units  0-9 Units Subcutaneous TID WC Vilma Prader, MD   3 Units at 02/02/18 1221  . insulin aspart (novoLOG) injection 3 Units  3 Units Subcutaneous TID WC Vilma Prader, MD   3 Units at 02/02/18 [phone]  . insulin glargine (LANTUS) injection 15 Units  15 Units Subcutaneous Daily Vilma Prader, MD   15 Units at 02/02/18 [phone]  . ipratropium-albuterol (DUONEB) 0.5-2.5 (3) MG/3ML nebulizer solution 3 mL  3 mL Nebulization TID Jonetta Osgood, MD   3 mL at 02/02/18 0912  . liver oil-zinc oxide (DESITIN) 40 % ointment   Topical QID Jonetta Osgood, MD      . MEDLINE mouth rinse  15 mL Mouth Rinse BID Jonetta Osgood, MD   15 mL at 02/02/18 0931  . metoprolol tartrate (LOPRESSOR) tablet 12.5 mg  12.5 mg Oral BID Vilma Prader, MD   12.5 mg at 02/02/18 0929  . mupirocin ointment (BACTROBAN) 2 % 1 application  1 application Nasal BID Jonetta Osgood, MD   1 application at 23/55/73 1221  . polyethylene glycol (MIRALAX / GLYCOLAX) packet 17 g  17 g Oral BID Vilma Prader, MD   17 g at 02/02/18 2202  . rivaroxaban (XARELTO) tablet 20 mg  20 mg Oral Q supper Vilma Prader, MD   20 mg at 02/01/18 1813  . senna-docusate (Senokot-S) tablet 1 tablet  1 tablet Oral BID Vilma Prader, MD   1 tablet at 02/02/18 [phone]  . sodium chloride flush (NS) 0.9 % injection 3 mL  3 mL Intravenous Q12H Vilma Prader, MD   3 mL at 02/01/18 2220  . tamsulosin (FLOMAX) capsule 0.4 mg  0.4 mg Oral Daily Vilma Prader, MD   0.4 mg at 02/02/18 0623     Discharge Medications: Please see discharge summary for a list of discharge medications.  Relevant Imaging Results:  Relevant Lab Results:   Additional Information SSN: [id number]  Eileen Stanford, LCSW

## 2018-02-02 NOTE — Progress Notes (Signed)
Inpatient Diabetes Program Recommendations  AACE/ADA: New Consensus Statement on Inpatient Glycemic Control (2015)  Target Ranges:  Prepandial:   less than 140 mg/dL      Peak postprandial:   less than 180 mg/dL (1-2 hours)      Critically ill patients:  140 - 180 mg/dL   Lab Results  Component Value Date   GLUCAP 192 (H) 02/02/2018   HGBA1C 8.8 (H) 11/26/2015    Review of Glycemic Control Results for [name], [name] (MRN [id number]) as of 02/02/2018 11:40  Ref. Range 02/01/2018 07:57 02/01/2018 11:25 02/01/2018 17:09 02/01/2018 22:06 02/02/2018 07:52  Glucose-Capillary Latest Ref Range: 70 - 99 mg/dL 237 (H) 260 (H) 283 (H) 212 (H) 192 (H)   Diabetes history: DM2 Outpatient Diabetes medications: Lantus 28 units + Novolog 3 units tid meal coverage Current orders for Inpatient glycemic control: Lantus 15 units + Novolog 3 units tid meal coverage + Novolog sensitive correction tid  Inpatient Diabetes Program Recommendations:   -Increase Lantus to 20 units daily  Noted patient is currently NPO for testing today.  Thank you, Nani Gasser. Jenina Moening, RN, MSN, CDE  Diabetes Coordinator Inpatient Glycemic Control Team Team Pager [phone] (8am-5pm) 02/02/2018 11:42 AM

## 2018-02-03 ENCOUNTER — Inpatient Hospital Stay (HOSPITAL_COMMUNITY): Payer: Medicare Other

## 2018-02-03 LAB — COMPREHENSIVE METABOLIC PANEL
ALT: 88 U/L — ABNORMAL HIGH (ref 0–44)
AST: 95 U/L — ABNORMAL HIGH (ref 15–41)
Albumin: 2.5 g/dL — ABNORMAL LOW (ref 3.5–5.0)
Alkaline Phosphatase: 116 U/L (ref 38–126)
Anion gap: 11 (ref 5–15)
BILIRUBIN TOTAL: 1.1 mg/dL (ref 0.3–1.2)
BUN: 43 mg/dL — ABNORMAL HIGH (ref 8–23)
CO2: 29 mmol/L (ref 22–32)
Calcium: 8.6 mg/dL — ABNORMAL LOW (ref 8.9–10.3)
Chloride: 99 mmol/L (ref 98–111)
Creatinine, Ser: 1.18 mg/dL (ref 0.61–1.24)
GFR calc Af Amer: >60 mL/min (ref >60)
GFR calc non Af Amer: 57 mL/min — ABNORMAL LOW (ref >60)
Glucose, Bld: 206 mg/dL — ABNORMAL HIGH (ref 70–99)
Potassium: 3.8 mmol/L (ref 3.5–5.1)
Sodium: 139 mmol/L (ref 135–145)
Total Protein: 6.5 g/dL (ref 6.5–8.1)

## 2018-02-03 LAB — CBC
HCT: 41.1 % (ref 39.0–52.0)
Hemoglobin: 13.6 g/dL (ref 13.0–17.0)
MCH: 29.8 pg (ref 26.0–34.0)
MCHC: 33.1 g/dL (ref 30.0–36.0)
MCV: 89.9 fL (ref 80.0–100.0)
Platelets: 288 10*3/uL (ref 150–400)
RBC: 4.57 MIL/uL (ref 4.22–5.81)
RDW: 13.4 % (ref 11.5–15.5)
WBC: 10.7 10*3/uL — ABNORMAL HIGH (ref 4.0–10.5)
nRBC: 0 % (ref 0.0–0.2)

## 2018-02-03 LAB — GLUCOSE, CAPILLARY
GLUCOSE-CAPILLARY: 219 mg/dL — AB (ref 70–99)
Glucose-Capillary: 179 mg/dL — ABNORMAL HIGH (ref 70–99)
Glucose-Capillary: 224 mg/dL — ABNORMAL HIGH (ref 70–99)
Glucose-Capillary: 179 mg/dL — ABNORMAL HIGH (ref 70–99)

## 2018-02-03 MED ORDER — INSULIN ASPART 100 UNIT/ML ~~LOC~~ SOLN
0.0000 [IU] | SUBCUTANEOUS | Status: DC
  Administered 2018-02-03: 2 [IU] via SUBCUTANEOUS
  Administered 2018-02-03: 3 [IU] via SUBCUTANEOUS
  Administered 2018-02-04: 2 [IU] via SUBCUTANEOUS
  Administered 2018-02-04: 1 [IU] via SUBCUTANEOUS
  Administered 2018-02-04: 2 [IU] via SUBCUTANEOUS
  Administered 2018-02-04: 1 [IU] via SUBCUTANEOUS
  Administered 2018-02-04 – 2018-02-05 (×4): 2 [IU] via SUBCUTANEOUS
  Administered 2018-02-05: 1 [IU] via SUBCUTANEOUS
  Administered 2018-02-05 (×2): 3 [IU] via SUBCUTANEOUS
  Administered 2018-02-06: 5 [IU] via SUBCUTANEOUS
  Administered 2018-02-06: 2 [IU] via SUBCUTANEOUS

## 2018-02-03 MED ORDER — ONDANSETRON HCL 4 MG/2ML IJ SOLN
4.0000 mg | Freq: Four times a day (QID) | INTRAMUSCULAR | Status: DC | PRN
  Administered 2018-02-03 – 2018-02-05 (×2): 4 mg via INTRAVENOUS
  Filled 2018-02-03 (×3): qty 2

## 2018-02-03 MED ORDER — METOPROLOL TARTRATE 5 MG/5ML IV SOLN
5.0000 mg | Freq: Four times a day (QID) | INTRAVENOUS | Status: DC | PRN
  Administered 2018-02-03: 5 mg via INTRAVENOUS
  Filled 2018-02-03: qty 5

## 2018-02-03 MED ORDER — BOOST / RESOURCE BREEZE PO LIQD CUSTOM
1.0000 | Freq: Three times a day (TID) | ORAL | Status: DC
  Administered 2018-02-04: 1 via ORAL
  Administered 2018-02-06: 16:00:00 via ORAL

## 2018-02-03 MED ORDER — ONDANSETRON HCL 4 MG/2ML IJ SOLN
4.0000 mg | Freq: Once | INTRAMUSCULAR | Status: AC
  Administered 2018-02-03: 4 mg via INTRAVENOUS

## 2018-02-03 NOTE — Progress Notes (Signed)
SLP Cancellation Note  Patient Details Name: Charles Friedman MRN: [id number] DOB: 01/12/1933   Cancelled treatment:       Reason Eval/Treat Not Completed: Medical issues which prohibited therapy - pt reports feeling nauseous, does not want to try any POs. Will f/u as able.   [name] 02/03/2018, 4:15 PM  [name], M.A. [place] Acute Environmental education officer [phone] Office [phone]

## 2018-02-03 NOTE — Progress Notes (Signed)
PROGRESS NOTE        PATIENT DETAILS Name: Charles Friedman Age: 84 y.o. Sex: male Date of Birth: 12-Jul-1932 Admit Date: 01/31/2018 Admitting Physician Vilma Prader, MD PQZ:[id number], Fransico Him, MD  Brief Narrative: Patient is a 84 y.o. male history of paroxysmal atrial fibrillation on anticoagulation, chronic diastolic heart failure, history of CVA, insulin-dependent diabetes-failure to thrive syndrome-just discharged from this hospital on 12/8-presenting with worsening weakness, chest congestion, shortness of breath-with some suggestion for aspiration pneumonia-and admitted to the hospitalist service.  See below for further details  Subjective: 2 episodes of vomiting this morning.  Constipated.  No nausea at the time of my evaluation though.  No abdominal pain.  No fever no chills.  Assessment/Plan: Acute hypoxic respiratory failure: Although no obvious infiltrate seen on imaging studies-given patient was accumulating secretions/gurgling on 12/16-and sounded very congested-high suspicion for microaspiration.  Has improved following pulmonary toileting, bronchodilators and empiric Zosyn.  We will narrow antibiotics to just Unasyn.  Appreciate speech therapy evaluation.  Continue attempts with flutter valve/incentive spirometry etc.  Titrate off oxygen as tolerated.  CT chest was negative for pulmonary embolism.   Chronic diastolic heart failure: He does not appear to be volume overloaded-however given hypoxia/respiratory failure-he was given IV Lasix empirically-but since he has developed mild AKI-we will stop diuretics today.    Mild AKI: Likely secondary to Lasix-hold Lasix today-recheck electrolytes tomorrow.  Transaminitis: Per family-patient's PCP was planning to a RUQ Mellody Dance has been ordered while he has been hospitalized-we will recheck LFTs tomorrow.  For now cautiously continue with statin.  Paroxysmal atrial fibrillation: Rate controlled with  metoprolol-continue Xarelto.    DM-2: CBG stable-continue Lantus 15 units daily, 3 units of NovoLog with meals and SSI.  Follow and adjust accordingly.   BPH: Continue Flomax.  Failure to thrive syndrome/acute on chronic debility: Per H&P-patient has been gradually declining over the past few months-worsening debility likely secondary to acute illness.  Await PT/OT evaluation.   Chronic left foot wound/right heel wound-unstageable:: Present prior to admission-appreciate wound care evaluation.  Intractable nausea and vomiting. X-ray abdomen shows no significant stool burden. Patient did have a bowel movement as well. For now will provide supportive measures as well as use IV Zofran. Monitor.  DVT Prophylaxis: Full dose anticoagulation with Xarelto  Code Status:  DNR  Family Communication: None at bedside  Disposition Plan: Remain inpatient- back to SNF on discharge over the next few days  Antimicrobial agents: Anti-infectives (From admission, onward)   Start     Dose/Rate Route Frequency Ordered Stop   02/03/18 1000  azithromycin (ZITHROMAX) tablet 500 mg  Status:  Discontinued     500 mg Oral Daily 02/02/18 0813 02/02/18 1008   02/02/18 1100  Ampicillin-Sulbactam (UNASYN) 3 g in sodium chloride 0.9 % 100 mL IVPB     3 g 200 mL/hr over 30 Minutes Intravenous Every 6 hours 02/02/18 1010     02/01/18 1400  piperacillin-tazobactam (ZOSYN) IVPB 3.375 g  Status:  Discontinued     3.375 g 12.5 mL/hr over 240 Minutes Intravenous Every 8 hours 02/01/18 0759 02/02/18 1008   02/01/18 0800  piperacillin-tazobactam (ZOSYN) IVPB 3.375 g     3.375 g 100 mL/hr over 30 Minutes Intravenous  Once 02/01/18 0759 02/01/18 1042   02/01/18 0745  azithromycin (ZITHROMAX) 500 mg in sodium chloride 0.9 % 250 mL  IVPB  Status:  Discontinued     500 mg 250 mL/hr over 60 Minutes Intravenous Every 24 hours 02/01/18 0734 02/02/18 0813      Procedures: None  CONSULTS:  None  Time spent: 35  minutes-Greater than 50% of this time was spent in counseling, explanation of diagnosis, planning of further management, and coordination of care.  MEDICATIONS: Scheduled Meds: . Chlorhexidine Gluconate Cloth  6 each Topical Q0600  . collagenase   Topical Daily  . feeding supplement  1 Container Oral TID BM  . guaiFENesin  600 mg Oral BID  . insulin aspart  0-9 Units Subcutaneous Q4H  . ipratropium-albuterol  3 mL Nebulization TID  . liver oil-zinc oxide   Topical QID  . mouth rinse  15 mL Mouth Rinse BID  . metoprolol tartrate  12.5 mg Oral BID  . mupirocin ointment  1 application Nasal BID  . rivaroxaban  20 mg Oral Q supper  . senna-docusate  1 tablet Oral BID  . sodium chloride flush  3 mL Intravenous Q12H  . tamsulosin  0.4 mg Oral Daily   Continuous Infusions: . ampicillin-sulbactam (UNASYN) IV 3 g (02/03/18 1743)   PRN Meds:.   PHYSICAL EXAM: Vital signs: Vitals:   02/03/18 1255 02/03/18 1300 02/03/18 1426 02/03/18 1738  BP: 117/67   125/66  Pulse: 95   97  Resp: (!) 38 20  16  Temp: 98.2 F (36.8 C)   97.8 F (36.6 C)  TempSrc: Oral   Oral  SpO2: 95%  100% 93%  Weight:      Height:       Filed Weights   02/02/18 0500 02/03/18 0600  Weight: 108.9 kg 112.5 kg   Body mass index is 33.63 kg/m.   General appearance: Awake, alert, very frail Eyes:no scleral icterus. HEENT: Atraumatic and Normocephalic Neck: supple, no JVD. Resp:Good air entry bilaterally, no transmitted sounds today. CVS: S1 S2 regular GI: Bowel sounds present, Non tender and not distended with no gaurding, rigidity or rebound. Extremities: B/L Lower Ext shows no edema, both legs are warm to touch Neurology:  Non focal-generalized weakness Musculoskeletal:No digital cyanosis Skin:No Rash, warm and dry Wounds:N/A  I have personally reviewed following labs and imaging studies  LABORATORY DATA: CBC: Recent Labs  Lab 01/28/18 0415 01/31/18 1706 01/31/18 1743 02/01/18 0245  02/02/18 0229 02/03/18 0236  WBC 7.8 12.1*  --  10.4 11.9* 10.7*  NEUTROABS  --  9.7*  --   --   --   --   HGB 13.4 14.4 15.3 13.8 12.9* 13.6  HCT 41.8 45.4 45.0 44.5 39.8 41.1  MCV 89.1 91.0  --  91.0 90.0 89.9  PLT 286 293  --  296 273 196    Basic Metabolic Panel: Recent Labs  Lab 01/28/18 0415 01/31/18 1716 01/31/18 1743 02/01/18 0245 02/02/18 0229 02/03/18 0236  NA 139 138 140 141 139 139  K 3.8 4.2 4.1 4.8 3.7 3.8  CL 99 99 99 99 97* 99  CO2 29 32  --  29 31 29   GLUCOSE 126* 180* 176* 236* 211* 206*  BUN 50* 47* 46* 47* 54* 43*  CREATININE 1.26* 1.11 1.10 1.14 1.35* 1.18  CALCIUM 8.9 8.7*  --  8.8* 8.7* 8.6*  MG 2.1  --   --   --   --   --     GFR: Estimated Creatinine Clearance: 61.5 mL/min (by C-G formula based on SCr of 1.18 mg/dL).  Liver Function Tests: Recent Labs  Lab 01/31/18 1716 02/03/18 0236  AST 120* 95*  ALT 101* 88*  ALKPHOS 144* 116  BILITOT 1.0 1.1  PROT 7.4 6.5  ALBUMIN 2.8* 2.5*   No results for input(s): LIPASE, AMYLASE in the last 168 hours. Recent Labs  Lab 01/31/18 1716  AMMONIA 15    Coagulation Profile: Recent Labs  Lab 01/31/18 1716  INR 1.24    Cardiac Enzymes: No results for input(s): CKTOTAL, CKMB, CKMBINDEX, TROPONINI in the last 168 hours.  BNP (last 3 results) No results for input(s): PROBNP in the last 8760 hours.  HbA1C: No results for input(s): HGBA1C in the last 72 hours.  CBG: Recent Labs  Lab 02/02/18 1707 02/02/18 2137 02/03/18 0736 02/03/18 1210 02/03/18 1654  GLUCAP 192* 165* 219* 179* 224*    Lipid Profile: No results for input(s): CHOL, HDL, LDLCALC, TRIG, CHOLHDL, LDLDIRECT in the last 72 hours.  Thyroid Function Tests: No results for input(s): TSH, T4TOTAL, FREET4, T3FREE, THYROIDAB in the last 72 hours.  Anemia Panel: No results for input(s): VITAMINB12, FOLATE, FERRITIN, TIBC, IRON, RETICCTPCT in the last 72 hours.  Urine analysis:    Component Value Date/Time   COLORURINE  YELLOW 02/01/2018 [place] 02/01/2018 1014   LABSPEC 1.015 02/01/2018 1014   PHURINE 5.0 02/01/2018 1014   GLUCOSEU NEGATIVE 02/01/2018 1014   HGBUR MODERATE (A) 02/01/2018 1014   BILIRUBINUR NEGATIVE 02/01/2018 1014   KETONESUR NEGATIVE 02/01/2018 1014   PROTEINUR NEGATIVE 02/01/2018 1014   UROBILINOGEN 0.2 07/15/2014 2038   NITRITE NEGATIVE 02/01/2018 1014   LEUKOCYTESUR SMALL (A) 02/01/2018 1014    Sepsis Labs: Lactic Acid, Venous    Component Value Date/Time   LATICACIDVEN 1.55 01/31/2018 1743    MICROBIOLOGY: Recent Results (from the past 240 hour(s))  Blood culture (routine x 2)     Status: None (Preliminary result)   Collection Time: 01/31/18  5:07 PM  Result Value Ref Range Status   Specimen Description BLOOD LEFT ANTECUBITAL  Final   Special Requests   Final    BOTTLES DRAWN AEROBIC AND ANAEROBIC Blood Culture adequate volume   Culture   Final    NO GROWTH 3 DAYS Performed at [facility], [place] [address], [place], [place] [address]    Report Status PENDING  Incomplete  Blood culture (routine x 2)     Status: None (Preliminary result)   Collection Time: 01/31/18  5:12 PM  Result Value Ref Range Status   Specimen Description BLOOD SITE NOT SPECIFIED  Final   Special Requests   Final    BOTTLES DRAWN AEROBIC AND ANAEROBIC Blood Culture adequate volume   Culture   Final    NO GROWTH 3 DAYS Performed at [facility], [address], [place],  [address]    Report Status PENDING  Incomplete  Respiratory Panel by PCR     Status: None   Collection Time: 02/01/18  1:13 AM  Result Value Ref Range Status   Adenovirus NOT DETECTED NOT DETECTED Final   Coronavirus 229E NOT DETECTED NOT DETECTED Final   Coronavirus HKU1 NOT DETECTED NOT DETECTED Final   Coronavirus NL63 NOT DETECTED NOT DETECTED Final   Coronavirus OC43 NOT DETECTED NOT DETECTED Final   Metapneumovirus NOT DETECTED NOT DETECTED Final   Rhinovirus / Enterovirus NOT  DETECTED NOT DETECTED Final   Influenza A NOT DETECTED NOT DETECTED Final   Influenza B NOT DETECTED NOT DETECTED Final   Parainfluenza Virus 1 NOT DETECTED NOT DETECTED  Final   Parainfluenza Virus 2 NOT DETECTED NOT DETECTED Final   Parainfluenza Virus 3 NOT DETECTED NOT DETECTED Final   Parainfluenza Virus 4 NOT DETECTED NOT DETECTED Final   Respiratory Syncytial Virus NOT DETECTED NOT DETECTED Final   Bordetella pertussis NOT DETECTED NOT DETECTED Final   Chlamydophila pneumoniae NOT DETECTED NOT DETECTED Final   Mycoplasma pneumoniae NOT DETECTED NOT DETECTED Final    Comment: Performed at [facility], [place] [address], [place], [place] [address]  MRSA PCR Screening     Status: Abnormal   Collection Time: 02/02/18 12:52 AM  Result Value Ref Range Status   MRSA by PCR POSITIVE (A) NEGATIVE Final    Comment:        The GeneXpert MRSA Assay (FDA approved for NASAL specimens only), is one component of a comprehensive MRSA colonization surveillance program. It is not intended to diagnose MRSA infection nor to guide or monitor treatment for MRSA infections. RESULT CALLED TO, READ BACK BY AND VERIFIED WITH: Neita Carp RN 11:00 02/02/18 ([email or web address]) Performed at [facility], [place] [address], [place], [place] [address]     RADIOLOGY STUDIES/RESULTS: Ct Head Wo Contrast  Result Date: 01/24/2018 CLINICAL DATA:  Altered mental status. EXAM: CT HEAD WITHOUT CONTRAST TECHNIQUE: Contiguous axial images were obtained from the base of the skull through the vertex without intravenous contrast. COMPARISON:  10/20/2017 FINDINGS: Brain: There is no evidence for acute hemorrhage, hydrocephalus, mass lesion, or abnormal extra-axial fluid collection. No definite CT evidence for acute infarction. Diffuse loss of parenchymal volume is consistent with atrophy. Patchy low attenuation in the deep hemispheric and periventricular white matter is nonspecific, but likely reflects chronic  microvascular ischemic demyelination. Vascular: No hyperdense vessel or unexpected calcification. Skull: No evidence for fracture. No worrisome lytic or sclerotic lesion. Sinuses/Orbits: The visualized paranasal sinuses and mastoid air cells are clear. Visualized portions of the globes and intraorbital fat are unremarkable. Other: None. IMPRESSION: 1. Stable.  No acute intracranial abnormality. 2. Atrophy with chronic small vessel white matter ischemic disease. Electronically Signed   By: Misty Stanley M.D.   On: 01/24/2018 17:51   Ct Angio Chest Pe W And/or Wo Contrast  Result Date: 01/31/2018 CLINICAL DATA:  Shortness of breath EXAM: CT ANGIOGRAPHY CHEST WITH CONTRAST TECHNIQUE: Multidetector CT imaging of the chest was performed using the standard protocol during bolus administration of intravenous contrast. Multiplanar CT image reconstructions and MIPs were obtained to evaluate the vascular anatomy. CONTRAST:  118mL ISOVUE-370 IOPAMIDOL (ISOVUE-370) INJECTION 76% COMPARISON:  None. FINDINGS: Cardiovascular: Heart is mildly enlarged. Aorta is normal caliber. Moderate coronary artery calcifications and aortic calcifications. No filling defects in the pulmonary arteries to suggest pulmonary emboli. Mediastinum/Nodes: No mediastinal, hilar, or axillary adenopathy. Lungs/Pleura: Predominantly linear densities in both lower lobes most compatible with atelectasis. Some tree-in-bud ground-glass nodular densities are noted in the superior segment of the right lower lobe which could reflect early alveolitis/small airways disease. No effusions. Upper Abdomen: Imaging into the upper abdomen shows no acute findings. Musculoskeletal: Chest wall soft tissues are unremarkable. No acute bony abnormality. Review of the MIP images confirms the above findings. IMPRESSION: No evidence of pulmonary embolus. Cardiomegaly, coronary artery disease. Bilateral lower lobe atelectasis. Tree-in-bud nodular densities in the superior  segment of the right lower lobe could reflect small airways disease/alveolitis. Aortic Atherosclerosis (ICD10-I70.0). Electronically Signed   By: Rolm Baptise M.D.   On: 01/31/2018 18:56   Dg Chest Port 1 View  Result Date: 02/01/2018 CLINICAL  DATA:  84 year old male with a history of aspiration EXAM: PORTABLE CHEST 1 VIEW COMPARISON:  01/31/2018, 01/17/2018 FINDINGS: Cardiomediastinal silhouette unchanged in size and contour. Low lung volumes accentuate the interstitium. Mild interlobular septal thickening and fullness in the central vasculature. No large pleural effusion or pneumothorax. No confluent airspace disease. No acute displaced fracture IMPRESSION: Low lung volumes with interlobular/interstitial opacities, potentially representing mild pulmonary edema, atelectasis/scarring. Electronically Signed   By: Corrie Mckusick D.O.   On: 02/01/2018 09:04   Dg Chest Port 1 View  Result Date: 01/31/2018 CLINICAL DATA:  Shortness of breath EXAM: PORTABLE CHEST 1 VIEW COMPARISON:  01/27/2018 FINDINGS: There is improved aeration of the lungs. No pulmonary edema or focal consolidation. Unchanged mild cardiomegaly. No pleural effusion or pneumothorax. IMPRESSION: Improved aeration without focal airspace disease. Electronically Signed   By: Ulyses Jarred M.D.   On: 01/31/2018 17:38   Dg Chest Port 1 View  Result Date: 01/27/2018 CLINICAL DATA:  Shortness of breath on exertion EXAM: PORTABLE CHEST 1 VIEW COMPARISON:  01/24/2018 FINDINGS: Cardiomegaly with vascular congestion. Increasing interstitial prominence in the lungs could reflect interstitial edema. Bibasilar atelectasis. No visible effusions or acute bony abnormality. IMPRESSION: Cardiomegaly with vascular congestion and interstitial prominence, likely interstitial edema. Bibasilar atelectasis. Electronically Signed   By: Rolm Baptise M.D.   On: 01/27/2018 09:44   Dg Chest Port 1 View  Result Date: 01/24/2018 CLINICAL DATA:  Shortness of breath and  weakness. Altered mental status. EXAM: PORTABLE CHEST 1 VIEW COMPARISON:  10/21/2017 FINDINGS: The heart is borderline enlarged but stable. There is tortuosity and calcification of the thoracic aorta. Chronic bronchitic type lung changes but no infiltrates or effusions. IMPRESSION: Mild stable cardiac enlargement but no acute pulmonary findings. Electronically Signed   By: Marijo Sanes M.D.   On: 01/24/2018 17:35   Dg Abd Portable 1v  Result Date: 02/03/2018 CLINICAL DATA:  Nausea and vomiting EXAM: PORTABLE ABDOMEN - 1 VIEW COMPARISON:  None. FINDINGS: The bowel gas pattern is normal. No radio-opaque calculi or other significant radiographic abnormality are seen. IMPRESSION: Negative. Electronically Signed   By: Nelson Chimes M.D.   On: 02/03/2018 12:51   US Abdomen Limited Ruq  Result Date: 02/02/2018 CLINICAL DATA:  Transaminitis EXAM: ULTRASOUND ABDOMEN LIMITED RIGHT UPPER QUADRANT COMPARISON:  None. FINDINGS: Gallbladder: No gallstones or wall thickening visualized. No sonographic Murphy sign noted by sonographer. Common bile duct: Diameter: 5.9 mm Liver: Limited evaluation due to body habitus. No focal abnormalities noted. Portal vein is patent on color Doppler imaging with normal direction of blood flow towards the liver. IMPRESSION: Study limited by patient body habitus. No cause for transaminitis identified. Electronically Signed   By: Dorise Bullion III M.D   On: 02/02/2018 15:35     LOS: 2 days   Berle Mull, MD  [facility]  If 7PM-7AM, please contact night-coverage  Please page via [email or web address] [email or web address]-click on MD name and type text message  02/03/2018, 7:10 PM

## 2018-02-03 NOTE — Progress Notes (Addendum)
Initial Nutrition Assessment  DOCUMENTATION CODES:   Not applicable  INTERVENTION:    Boost Breeze po TID, each supplement provides 250 kcal and 9 grams of protein  NUTRITION DIAGNOSIS:   Increased nutrient needs related to chronic illness as evidenced by estimated needs  GOAL:   Patient will meet greater than or equal to 90% of their needs  MONITOR:   PO intake, Supplement acceptance, Labs, Weight trends, Skin, I & O's  REASON FOR ASSESSMENT:   Malnutrition Screening Tool  ASSESSMENT:   84 y.o. Male wtih PMH of paroxysmal atrial fibrillation on anticoagulation, CHF, history of CVA, insulin-dependent diabetes-failure to thrive syndrome-just discharged from this hospital on 12/8-presenting with worsening weakness, chest congestion, SOB - with some suggestion for aspiration pneumonia.  RD unable to obtain nutrition hx from patient. Receiving nursing care at time of visit. Per NT, pt was vomiting this AM.  Per nutrition screen pt has been eating poorly bc of a decreased appetite. Patient has also recently lost weight without trying. Labs & medications reviewed. BUN 43 (H). CBG's [phone].  CWOCN 12/16 note reviewed.  NUTRITION - FOCUSED PHYSICAL EXAM:  Unable to complete at this time.  Diet Order:   Diet Order            Diet clear liquid Room service appropriate? Yes; Fluid consistency: Thin  Diet effective now             EDUCATION NEEDS:   Not appropriate for education at this time  Skin:  Skin Assessment: Skin Integrity Issues: Skin Integrity Issues:: DTI DTI: L foot  Last BM:  12/16  Height:   Ht Readings from Last 1 Encounters:  02/02/18 6' (1.829 m)   Weight:   Wt Readings from Last 1 Encounters:  02/03/18 112.5 kg   BMI:  Body mass index is 33.63 kg/m.   Intake/Output Summary (Last 24 hours) at 02/03/2018 1232 Last data filed at 02/03/2018 1000 Gross per 24 hour  Intake 320 ml  Output 250 ml  Net 70 ml   Estimated  Nutritional Needs:   Kcal:     Protein:     Fluid:     Arthur Holms, RD, LDN Pager #: [phone] After-Hours Pager #: [phone]

## 2018-02-04 LAB — CBC WITH DIFFERENTIAL/PLATELET
Abs Immature Granulocytes: 0.04 10*3/uL (ref 0.00–0.07)
BASOS ABS: 0.1 10*3/uL (ref 0.0–0.1)
Basophils Relative: 1 %
Eosinophils Absolute: 0.2 10*3/uL (ref 0.0–0.5)
Eosinophils Relative: 2 %
HCT: 42 % (ref 39.0–52.0)
Hemoglobin: 13.1 g/dL (ref 13.0–17.0)
Immature Granulocytes: 0 %
Lymphocytes Relative: 12 %
Lymphs Abs: 1.3 10*3/uL (ref 0.7–4.0)
MCH: 28.5 pg (ref 26.0–34.0)
MCHC: 31.2 g/dL (ref 30.0–36.0)
MCV: 91.5 fL (ref 80.0–100.0)
Monocytes Absolute: 0.8 10*3/uL (ref 0.1–1.0)
Monocytes Relative: 8 %
NRBC: 0 % (ref 0.0–0.2)
Neutro Abs: 8 10*3/uL — ABNORMAL HIGH (ref 1.7–7.7)
Neutrophils Relative %: 77 %
Platelets: 267 10*3/uL (ref 150–400)
RBC: 4.59 MIL/uL (ref 4.22–5.81)
RDW: 13.2 % (ref 11.5–15.5)
WBC: 10.3 10*3/uL (ref 4.0–10.5)

## 2018-02-04 LAB — COMPREHENSIVE METABOLIC PANEL
ALT: 90 U/L — ABNORMAL HIGH (ref 0–44)
AST: 102 U/L — ABNORMAL HIGH (ref 15–41)
Albumin: 2.4 g/dL — ABNORMAL LOW (ref 3.5–5.0)
Alkaline Phosphatase: 106 U/L (ref 38–126)
Anion gap: 12 (ref 5–15)
BILIRUBIN TOTAL: 1.2 mg/dL (ref 0.3–1.2)
BUN: 38 mg/dL — ABNORMAL HIGH (ref 8–23)
CO2: 29 mmol/L (ref 22–32)
Calcium: 8.6 mg/dL — ABNORMAL LOW (ref 8.9–10.3)
Chloride: 103 mmol/L (ref 98–111)
Creatinine, Ser: 1.12 mg/dL (ref 0.61–1.24)
GFR calc non Af Amer: >60 mL/min (ref >60)
Glucose, Bld: 157 mg/dL — ABNORMAL HIGH (ref 70–99)
Potassium: 3.5 mmol/L (ref 3.5–5.1)
Sodium: 144 mmol/L (ref 135–145)
TOTAL PROTEIN: 6.2 g/dL — AB (ref 6.5–8.1)

## 2018-02-04 LAB — GLUCOSE, CAPILLARY
GLUCOSE-CAPILLARY: 177 mg/dL — AB (ref 70–99)
Glucose-Capillary: 132 mg/dL — ABNORMAL HIGH (ref 70–99)
Glucose-Capillary: 148 mg/dL — ABNORMAL HIGH (ref 70–99)
Glucose-Capillary: 149 mg/dL — ABNORMAL HIGH (ref 70–99)
Glucose-Capillary: 153 mg/dL — ABNORMAL HIGH (ref 70–99)
Glucose-Capillary: 157 mg/dL — ABNORMAL HIGH (ref 70–99)
Glucose-Capillary: 170 mg/dL — ABNORMAL HIGH (ref 70–99)

## 2018-02-04 LAB — MAGNESIUM: Magnesium: 2.2 mg/dL (ref 1.7–2.4)

## 2018-02-04 LAB — LACTIC ACID, PLASMA: Lactic Acid, Venous: 1.5 mmol/L (ref 0.5–1.9)

## 2018-02-04 MED ORDER — CEFDINIR 300 MG PO CAPS
300.0000 mg | ORAL_CAPSULE | Freq: Two times a day (BID) | ORAL | Status: DC
  Administered 2018-02-04 – 2018-02-07 (×7): 300 mg via ORAL
  Filled 2018-02-04 (×7): qty 1

## 2018-02-04 NOTE — Progress Notes (Signed)
Pt. Had increased O2 demand over night starting around 2 am going as low as 82 w/ good waveform. Started at 4L [place] and needed as much as 10 L HFNC w/ salter. RT notified. Lung sounds very diminished but no crackles or wetness noted. Pt. Reminded AO x 4 with no change in LoC. Pt currently weaned back down to 6L with O2 sats @ 98. Will continue to monitor, encourage IS, and  titrate O2 down as permitted.

## 2018-02-04 NOTE — Care Management Important Message (Signed)
Important Message  Patient Details  Name: Charles Friedman MRN: [id number] Date of Birth: 1932-09-22   Medicare Important Message Given:  Yes    Zenon Mayo, RN 02/04/2018, 12:47 PM

## 2018-02-04 NOTE — Progress Notes (Signed)
Cefdinir to complete 7d of abx. Ok per Dr Posey Pronto  Onnie Boer, PharmD, BCIDP, AAHIVP, CPP Infectious Disease Pharmacist 02/04/2018 11:16 AM

## 2018-02-04 NOTE — Progress Notes (Signed)
Increased o2 to 5lpm

## 2018-02-04 NOTE — Progress Notes (Signed)
Physical Therapy Treatment Patient Details Name: Charles Friedman MRN: [id number] DOB: 1932/05/04 Today's Date: 02/04/2018    History of Present Illness Patient is a 84 y.o. male admitted 01/24/18 and again on 02/01/18 with SOB and weakness. Found to have acute decompensated CHF. Of note, admitted in Sept for similar symptoms. PMH includes A-fib, CVA, bil cataracts, DM, glaucoma, diabetic neuropathy, prostate CA.    PT Comments    Patient limited today secondary to nausea and feeling sick on his stomach. Tolerated sitting EOB ~17 mins working on balance, core strength and exercises. Sp02 remained >90% on 4L/min 02. Pt with slow processing, impaired attention and impaired memory. Encouraged pt to perform acapella during commercials. Appropriate to return to SNF. Will follow.   Follow Up Recommendations  SNF;Supervision for mobility/OOB     Equipment Recommendations  None recommended by PT    Recommendations for Other Services       Precautions / Restrictions Precautions Precautions: Fall Precaution Comments: Inconsistent with amount of assist required Restrictions Weight Bearing Restrictions: No    Mobility  Bed Mobility Overal bed mobility: Needs Assistance Bed Mobility: Supine to Sit     Supine to sit: Max assist;HOB elevated Sit to supine: Max assist;HOB elevated   General bed mobility comments: Assist to bring LEs to EOB and elevate trunk; as to return to supine. Cues for technique and to reach for rail.  Transfers                 General transfer comment: Deferred secondary to pt feeling nauseaous and sick to his stomach  Ambulation/Gait                 Stairs             Wheelchair Mobility    Modified Rankin (Stroke Patients Only)       Balance Overall balance assessment: Needs assistance;History of Falls Sitting-balance support: Feet supported;Single extremity supported Sitting balance-Leahy Scale: Poor Sitting balance - Comments:  Varying support needed from Max A-Min guard assist due to fatigue and posterior lean. Able to initiate forward lean but not sustain. Sat EOB ~17 mins working on balance/exercise. Postural control: Posterior lean                                  Cognition Arousal/Alertness: Awake/alert Behavior During Therapy: WFL for tasks assessed/performed Overall Cognitive Status: No family/caregiver present to determine baseline cognitive functioning Area of Impairment: Memory;Following commands;Problem solving;Attention;Safety/judgement;Awareness                 Orientation Level: Time;Disoriented to Current Attention Level: Selective Memory: Decreased short-term memory Following Commands: Follows one step commands with increased time Safety/Judgement: Decreased awareness of safety;Decreased awareness of deficits Awareness: Emergent Problem Solving: Slow processing;Decreased initiation;Difficulty sequencing;Requires verbal cues;Requires tactile cues General Comments: Slow processing. Laughs appropriately.      Exercises General Exercises - Lower Extremity Hip ABduction/ADduction: Both;10 reps;Seated    General Comments General comments (skin integrity, edema, etc.): Sp02 remained >90% on 4L/min 02.      Pertinent Vitals/Pain Pain Assessment: Faces Faces Pain Scale: Hurts little more Pain Location: stomach Pain Descriptors / Indicators: Aching;Discomfort Pain Intervention(s): Monitored during session;Repositioned;Limited activity within patient's tolerance    Home Living                      Prior Function  PT Goals (current goals can now be found in the care plan section) Progress towards PT goals: Not progressing toward goals - comment(secondary to nausea)    Frequency    Min 2X/week      PT Plan Current plan remains appropriate    Co-evaluation              AM-PAC PT "6 Clicks" Mobility   Outcome Measure  Help needed  turning from your back to your side while in a flat bed without using bedrails?: A Lot Help needed moving from lying on your back to sitting on the side of a flat bed without using bedrails?: A Lot Help needed moving to and from a bed to a chair (including a wheelchair)?: A Lot Help needed standing up from a chair using your arms (e.g., wheelchair or bedside chair)?: A Lot Help needed to walk in hospital room?: A Lot Help needed climbing 3-5 steps with a railing? : Total 6 Click Score: 11    End of Session Equipment Utilized During Treatment: Oxygen Activity Tolerance: Patient limited by fatigue;Other (comment)(nausea) Patient left: in bed;with call bell/phone within reach;with bed alarm set;Other (comment)(Prevalon boots donned) Nurse Communication: Mobility status PT Visit Diagnosis: Muscle weakness (generalized) (M62.81);Difficulty in walking, not elsewhere classified (R26.2);Pain Pain - part of body: (stomach)     Time: 5110-2111 PT Time Calculation (min) (ACUTE ONLY): 28 min  Charges:  $Therapeutic Activity: 23-37 mins                     Wray Kearns, [place], DPT Acute Rehabilitation Services Pager [phone] Office [phone]       Charles Friedman 02/04/2018, 2:59 PM

## 2018-02-04 NOTE — Progress Notes (Signed)
PROGRESS NOTE        PATIENT DETAILS Name: Charles Friedman Age: 84 y.o. Sex: male Date of Birth: [birth year] Admit Date: 01/31/2018 Admitting Physician Vilma Prader, MD EXB:[id number], Fransico Him, MD  Brief Narrative: Patient is a 84 y.o. male history of paroxysmal atrial fibrillation on anticoagulation, chronic diastolic heart failure, history of CVA, insulin-dependent diabetes-failure to thrive syndrome-just discharged from this hospital on 12/8-presenting with worsening weakness, chest congestion, shortness of breath-with some suggestion for aspiration pneumonia-and admitted to the hospitalist service.  See below for further details  Subjective: No further vomiting.  Actually have a bowel movement.  No abdominal pain.  Assessment/Plan: Acute hypoxic respiratory failure: Although no obvious infiltrate seen on imaging studies-given patient was accumulating secretions/gurgling on 12/16-and sounded very congested-high suspicion for microaspiration.  Has improved following pulmonary toileting, bronchodilators and empiric Zosyn.  Patient was on IV Unasyn.  Will transition to oral cefdinir. Appreciate speech therapy evaluation.  Continue attempts with flutter valve/incentive spirometry etc.  Titrate off oxygen as tolerated.  CT chest was negative for pulmonary embolism.   Chronic diastolic heart failure: He does not appear to be volume overloaded-however given hypoxia/respiratory failure-he was given IV Lasix empirically-but since he has developed mild AKI-we will stop diuretics today.    Mild AKI: Likely secondary to Lasix-hold Lasix today-recheck electrolytes tomorrow.  Transaminitis: Right upper quadrant ultrasound negative. Statin is on hold. Discontinue Unasyn since that can also cause elevation of LFT.  Paroxysmal atrial fibrillation: Rate controlled with metoprolol-continue Xarelto.    DM-2: CBG stable-continue Lantus 15 units daily, 3 units of NovoLog with  meals and SSI.  Follow and adjust accordingly.   BPH: Continue Flomax.  Failure to thrive syndrome/acute on chronic debility: Per H&P-patient has been gradually declining over the past few months-worsening debility likely secondary to acute illness.  Await PT/OT evaluation.   Chronic left foot wound/right heel wound-unstageable:: Present prior to admission-appreciate wound care evaluation.  Intractable nausea and vomiting. X-ray abdomen shows no significant stool burden. Patient did have a bowel movement as well. For now will provide supportive measures as well as use IV Zofran. Now the patient does not have any further vomiting will advance the diet and monitor.  DVT Prophylaxis: Full dose anticoagulation with Xarelto  Code Status:  DNR  Family Communication: None at bedside  Disposition Plan: Remain inpatient- back to SNF on discharge probably tomorrow  Antimicrobial agents: Anti-infectives (From admission, onward)   Start     Dose/Rate Route Frequency Ordered Stop   02/04/18 1000  cefdinir (OMNICEF) capsule 300 mg     300 mg Oral Every 12 hours 02/04/18 0913 02/08/18 2359   02/03/18 1000  azithromycin (ZITHROMAX) tablet 500 mg  Status:  Discontinued     500 mg Oral Daily 02/02/18 0813 02/02/18 1008   02/02/18 1100  Ampicillin-Sulbactam (UNASYN) 3 g in sodium chloride 0.9 % 100 mL IVPB  Status:  Discontinued     3 g 200 mL/hr over 30 Minutes Intravenous Every 6 hours 02/02/18 1010 02/04/18 0809   02/01/18 1400  piperacillin-tazobactam (ZOSYN) IVPB 3.375 g  Status:  Discontinued     3.375 g 12.5 mL/hr over 240 Minutes Intravenous Every 8 hours 02/01/18 0759 02/02/18 1008   02/01/18 0800  piperacillin-tazobactam (ZOSYN) IVPB 3.375 g     3.375 g 100 mL/hr over 30 Minutes Intravenous  Once 02/01/18  7412 02/01/18 1042   02/01/18 0745  azithromycin (ZITHROMAX) 500 mg in sodium chloride 0.9 % 250 mL IVPB  Status:  Discontinued     500 mg 250 mL/hr over 60 Minutes Intravenous  Every 24 hours 02/01/18 0734 02/02/18 0813      Procedures: None  CONSULTS:  None  Time spent: 35 minutes-Greater than 50% of this time was spent in counseling, explanation of diagnosis, planning of further management, and coordination of care.  MEDICATIONS: Scheduled Meds: . cefdinir  300 mg Oral Q12H  . Chlorhexidine Gluconate Cloth  6 each Topical Q0600  . collagenase   Topical Daily  . feeding supplement  1 Container Oral TID BM  . guaiFENesin  600 mg Oral BID  . insulin aspart  0-9 Units Subcutaneous Q4H  . ipratropium-albuterol  3 mL Nebulization TID  . liver oil-zinc oxide   Topical QID  . mouth rinse  15 mL Mouth Rinse BID  . metoprolol tartrate  12.5 mg Oral BID  . mupirocin ointment  1 application Nasal BID  . rivaroxaban  20 mg Oral Q supper  . senna-docusate  1 tablet Oral BID  . sodium chloride flush  3 mL Intravenous Q12H  . tamsulosin  0.4 mg Oral Daily   Continuous Infusions:  PRN Meds:.   PHYSICAL EXAM: Vital signs: Vitals:   02/04/18 1427 02/04/18 1731 02/04/18 1916 02/04/18 2035  BP:  137/74  122/60  Pulse:  (!) 102  100  Resp:  18    Temp:  97.7 F (36.5 C)    TempSrc:  Oral    SpO2: (!) 88% 95% 97% 98%  Weight:      Height:       Filed Weights   02/02/18 0500 02/03/18 0600 02/04/18 0500  Weight: 108.9 kg 112.5 kg 104.3 kg   Body mass index is 31.19 kg/m.   General appearance: Awake, alert, very frail Eyes:no scleral icterus. HEENT: Atraumatic and Normocephalic Neck: supple, no JVD. Resp:Good air entry bilaterally, no transmitted sounds today. CVS: S1 S2 regular GI: Bowel sounds present, Non tender and not distended with no gaurding, rigidity or rebound. Extremities: B/L Lower Ext shows no edema, both legs are warm to touch Neurology:  Non focal-generalized weakness Musculoskeletal:No digital cyanosis Skin:No Rash, warm and dry Wounds:N/A  I have personally reviewed following labs and imaging studies  LABORATORY  DATA: CBC: Recent Labs  Lab 01/31/18 1706 01/31/18 1743 02/01/18 0245 02/02/18 0229 02/03/18 0236 02/04/18 0239  WBC 12.1*  --  10.4 11.9* 10.7* 10.3  NEUTROABS 9.7*  --   --   --   --  8.0*  HGB 14.4 15.3 13.8 12.9* 13.6 13.1  HCT 45.4 45.0 44.5 39.8 41.1 42.0  MCV 91.0  --  91.0 90.0 89.9 91.5  PLT 293  --  296 273 288 878    Basic Metabolic Panel: Recent Labs  Lab 01/31/18 1716 01/31/18 1743 02/01/18 0245 02/02/18 0229 02/03/18 0236 02/04/18 0239  NA 138 140 141 139 139 144  K 4.2 4.1 4.8 3.7 3.8 3.5  CL 99 99 99 97* 99 103  CO2 32  --  29 31 29 29   GLUCOSE 180* 176* 236* 211* 206* 157*  BUN 47* 46* 47* 54* 43* 38*  CREATININE 1.11 1.10 1.14 1.35* 1.18 1.12  CALCIUM 8.7*  --  8.8* 8.7* 8.6* 8.6*  MG  --   --   --   --   --  2.2    GFR: Estimated Creatinine  Clearance: 62.4 mL/min (by C-G formula based on SCr of 1.12 mg/dL).  Liver Function Tests: Recent Labs  Lab 01/31/18 1716 02/03/18 0236 02/04/18 0239  AST 120* 95* 102*  ALT 101* 88* 90*  ALKPHOS 144* 116 106  BILITOT 1.0 1.1 1.2  PROT 7.4 6.5 6.2*  ALBUMIN 2.8* 2.5* 2.4*   No results for input(s): LIPASE, AMYLASE in the last 168 hours. Recent Labs  Lab 01/31/18 1716  AMMONIA 15    Coagulation Profile: Recent Labs  Lab 01/31/18 1716  INR 1.24    Cardiac Enzymes: No results for input(s): CKTOTAL, CKMB, CKMBINDEX, TROPONINI in the last 168 hours.  BNP (last 3 results) No results for input(s): PROBNP in the last 8760 hours.  HbA1C: No results for input(s): HGBA1C in the last 72 hours.  CBG: Recent Labs  Lab 02/04/18 0415 02/04/18 0751 02/04/18 1144 02/04/18 1733 02/04/18 2036  GLUCAP 149* 132* 153* 148* 170*    Lipid Profile: No results for input(s): CHOL, HDL, LDLCALC, TRIG, CHOLHDL, LDLDIRECT in the last 72 hours.  Thyroid Function Tests: No results for input(s): TSH, T4TOTAL, FREET4, T3FREE, THYROIDAB in the last 72 hours.  Anemia Panel: No results for input(s):  VITAMINB12, FOLATE, FERRITIN, TIBC, IRON, RETICCTPCT in the last 72 hours.  Urine analysis:    Component Value Date/Time   COLORURINE YELLOW 02/01/2018 [place] 02/01/2018 1014   LABSPEC 1.015 02/01/2018 1014   PHURINE 5.0 02/01/2018 1014   GLUCOSEU NEGATIVE 02/01/2018 1014   HGBUR MODERATE (A) 02/01/2018 1014   BILIRUBINUR NEGATIVE 02/01/2018 1014   KETONESUR NEGATIVE 02/01/2018 1014   PROTEINUR NEGATIVE 02/01/2018 1014   UROBILINOGEN 0.2 07/15/2014 2038   NITRITE NEGATIVE 02/01/2018 1014   LEUKOCYTESUR SMALL (A) 02/01/2018 1014    Sepsis Labs: Lactic Acid, Venous    Component Value Date/Time   LATICACIDVEN 1.5 02/04/2018 0239    MICROBIOLOGY: Recent Results (from the past 240 hour(s))  Blood culture (routine x 2)     Status: None (Preliminary result)   Collection Time: 01/31/18  5:07 PM  Result Value Ref Range Status   Specimen Description BLOOD LEFT ANTECUBITAL  Final   Special Requests   Final    BOTTLES DRAWN AEROBIC AND ANAEROBIC Blood Culture adequate volume   Culture   Final    NO GROWTH 4 DAYS Performed at [facility], [place] [address], [place], [place] [address]    Report Status PENDING  Incomplete  Blood culture (routine x 2)     Status: None (Preliminary result)   Collection Time: 01/31/18  5:12 PM  Result Value Ref Range Status   Specimen Description BLOOD SITE NOT SPECIFIED  Final   Special Requests   Final    BOTTLES DRAWN AEROBIC AND ANAEROBIC Blood Culture adequate volume   Culture   Final    NO GROWTH 4 DAYS Performed at [facility], [place] [address] [place], [place] [address]    Report Status PENDING  Incomplete  Respiratory Panel by PCR     Status: None   Collection Time: 02/01/18  1:13 AM  Result Value Ref Range Status   Adenovirus NOT DETECTED NOT DETECTED Final   Coronavirus 229E NOT DETECTED NOT DETECTED Final   Coronavirus HKU1 NOT DETECTED NOT DETECTED Final   Coronavirus NL63 NOT DETECTED NOT DETECTED Final    Coronavirus OC43 NOT DETECTED NOT DETECTED Final   Metapneumovirus NOT DETECTED NOT DETECTED Final   Rhinovirus / Enterovirus NOT DETECTED NOT DETECTED Final  Influenza A NOT DETECTED NOT DETECTED Final   Influenza B NOT DETECTED NOT DETECTED Final   Parainfluenza Virus 1 NOT DETECTED NOT DETECTED Final   Parainfluenza Virus 2 NOT DETECTED NOT DETECTED Final   Parainfluenza Virus 3 NOT DETECTED NOT DETECTED Final   Parainfluenza Virus 4 NOT DETECTED NOT DETECTED Final   Respiratory Syncytial Virus NOT DETECTED NOT DETECTED Final   Bordetella pertussis NOT DETECTED NOT DETECTED Final   Chlamydophila pneumoniae NOT DETECTED NOT DETECTED Final   Mycoplasma pneumoniae NOT DETECTED NOT DETECTED Final    Comment: Performed at [facility], [place] [address] [place], [place] [address]  MRSA PCR Screening     Status: Abnormal   Collection Time: 02/02/18 12:52 AM  Result Value Ref Range Status   MRSA by PCR POSITIVE (A) NEGATIVE Final    Comment:        The GeneXpert MRSA Assay (FDA approved for NASAL specimens only), is one component of a comprehensive MRSA colonization surveillance program. It is not intended to diagnose MRSA infection nor to guide or monitor treatment for MRSA infections. RESULT CALLED TO, READ BACK BY AND VERIFIED WITH: Neita Carp RN 11:00 02/02/18 ([email or web address]) Performed at [facility], [place] [address], [place], [place] [address]     RADIOLOGY STUDIES/RESULTS: Ct Head Wo Contrast  Result Date: 01/24/2018 CLINICAL DATA:  Altered mental status. EXAM: CT HEAD WITHOUT CONTRAST TECHNIQUE: Contiguous axial images were obtained from the base of the skull through the vertex without intravenous contrast. COMPARISON:  10/20/2017 FINDINGS: Brain: There is no evidence for acute hemorrhage, hydrocephalus, mass lesion, or abnormal extra-axial fluid collection. No definite CT evidence for acute infarction. Diffuse loss of parenchymal volume is consistent with  atrophy. Patchy low attenuation in the deep hemispheric and periventricular white matter is nonspecific, but likely reflects chronic microvascular ischemic demyelination. Vascular: No hyperdense vessel or unexpected calcification. Skull: No evidence for fracture. No worrisome lytic or sclerotic lesion. Sinuses/Orbits: The visualized paranasal sinuses and mastoid air cells are clear. Visualized portions of the globes and intraorbital fat are unremarkable. Other: None. IMPRESSION: 1. Stable.  No acute intracranial abnormality. 2. Atrophy with chronic small vessel white matter ischemic disease. Electronically Signed   By: Misty Stanley M.D.   On: 01/24/2018 17:51   Ct Angio Chest Pe W And/or Wo Contrast  Result Date: 01/31/2018 CLINICAL DATA:  Shortness of breath EXAM: CT ANGIOGRAPHY CHEST WITH CONTRAST TECHNIQUE: Multidetector CT imaging of the chest was performed using the standard protocol during bolus administration of intravenous contrast. Multiplanar CT image reconstructions and MIPs were obtained to evaluate the vascular anatomy. CONTRAST:  11mL ISOVUE-370 IOPAMIDOL (ISOVUE-370) INJECTION 76% COMPARISON:  None. FINDINGS: Cardiovascular: Heart is mildly enlarged. Aorta is normal caliber. Moderate coronary artery calcifications and aortic calcifications. No filling defects in the pulmonary arteries to suggest pulmonary emboli. Mediastinum/Nodes: No mediastinal, hilar, or axillary adenopathy. Lungs/Pleura: Predominantly linear densities in both lower lobes most compatible with atelectasis. Some tree-in-bud ground-glass nodular densities are noted in the superior segment of the right lower lobe which could reflect early alveolitis/small airways disease. No effusions. Upper Abdomen: Imaging into the upper abdomen shows no acute findings. Musculoskeletal: Chest wall soft tissues are unremarkable. No acute bony abnormality. Review of the MIP images confirms the above findings. IMPRESSION: No evidence of pulmonary  embolus. Cardiomegaly, coronary artery disease. Bilateral lower lobe atelectasis. Tree-in-bud nodular densities in the superior segment of the right lower lobe could reflect small airways disease/alveolitis. Aortic Atherosclerosis (ICD10-I70.0). Electronically  Signed   By: Rolm Baptise M.D.   On: 01/31/2018 18:56   Dg Chest Port 1 View  Result Date: 02/01/2018 CLINICAL DATA:  84 year old male with a history of aspiration EXAM: PORTABLE CHEST 1 VIEW COMPARISON:  01/31/2018, 01/17/2018 FINDINGS: Cardiomediastinal silhouette unchanged in size and contour. Low lung volumes accentuate the interstitium. Mild interlobular septal thickening and fullness in the central vasculature. No large pleural effusion or pneumothorax. No confluent airspace disease. No acute displaced fracture IMPRESSION: Low lung volumes with interlobular/interstitial opacities, potentially representing mild pulmonary edema, atelectasis/scarring. Electronically Signed   By: Corrie Mckusick D.O.   On: 02/01/2018 09:04   Dg Chest Port 1 View  Result Date: 01/31/2018 CLINICAL DATA:  Shortness of breath EXAM: PORTABLE CHEST 1 VIEW COMPARISON:  01/27/2018 FINDINGS: There is improved aeration of the lungs. No pulmonary edema or focal consolidation. Unchanged mild cardiomegaly. No pleural effusion or pneumothorax. IMPRESSION: Improved aeration without focal airspace disease. Electronically Signed   By: Ulyses Jarred M.D.   On: 01/31/2018 17:38   Dg Chest Port 1 View  Result Date: 01/27/2018 CLINICAL DATA:  Shortness of breath on exertion EXAM: PORTABLE CHEST 1 VIEW COMPARISON:  01/24/2018 FINDINGS: Cardiomegaly with vascular congestion. Increasing interstitial prominence in the lungs could reflect interstitial edema. Bibasilar atelectasis. No visible effusions or acute bony abnormality. IMPRESSION: Cardiomegaly with vascular congestion and interstitial prominence, likely interstitial edema. Bibasilar atelectasis. Electronically Signed   By: Rolm Baptise M.D.   On: 01/27/2018 09:44   Dg Chest Port 1 View  Result Date: 01/24/2018 CLINICAL DATA:  Shortness of breath and weakness. Altered mental status. EXAM: PORTABLE CHEST 1 VIEW COMPARISON:  10/21/2017 FINDINGS: The heart is borderline enlarged but stable. There is tortuosity and calcification of the thoracic aorta. Chronic bronchitic type lung changes but no infiltrates or effusions. IMPRESSION: Mild stable cardiac enlargement but no acute pulmonary findings. Electronically Signed   By: Marijo Sanes M.D.   On: 01/24/2018 17:35   Dg Abd Portable 1v  Result Date: 02/03/2018 CLINICAL DATA:  Nausea and vomiting EXAM: PORTABLE ABDOMEN - 1 VIEW COMPARISON:  None. FINDINGS: The bowel gas pattern is normal. No radio-opaque calculi or other significant radiographic abnormality are seen. IMPRESSION: Negative. Electronically Signed   By: Nelson Chimes M.D.   On: 02/03/2018 12:51   US Abdomen Limited Ruq  Result Date: 02/02/2018 CLINICAL DATA:  Transaminitis EXAM: ULTRASOUND ABDOMEN LIMITED RIGHT UPPER QUADRANT COMPARISON:  None. FINDINGS: Gallbladder: No gallstones or wall thickening visualized. No sonographic Murphy sign noted by sonographer. Common bile duct: Diameter: 5.9 mm Liver: Limited evaluation due to body habitus. No focal abnormalities noted. Portal vein is patent on color Doppler imaging with normal direction of blood flow towards the liver. IMPRESSION: Study limited by patient body habitus. No cause for transaminitis identified. Electronically Signed   By: Dorise Bullion III M.D   On: 02/02/2018 15:35     LOS: 3 days   Berle Mull, MD  [facility]  If 7PM-7AM, please contact night-coverage  Please page via [email or web address] [email or web address]-click on MD name and type text message  02/04/2018, 9:24 PM

## 2018-02-05 LAB — HEPATIC FUNCTION PANEL
ALT: 93 U/L — AB (ref 0–44)
AST: 99 U/L — ABNORMAL HIGH (ref 15–41)
Albumin: 2.2 g/dL — ABNORMAL LOW (ref 3.5–5.0)
Alkaline Phosphatase: 106 U/L (ref 38–126)
Bilirubin, Direct: 0.2 mg/dL (ref 0.0–0.2)
Indirect Bilirubin: 0.9 mg/dL (ref 0.3–0.9)
Total Bilirubin: 1.1 mg/dL (ref 0.3–1.2)
Total Protein: 6.3 g/dL — ABNORMAL LOW (ref 6.5–8.1)

## 2018-02-05 LAB — CULTURE, BLOOD (ROUTINE X 2)
Culture: NO GROWTH
Culture: NO GROWTH
Special Requests: ADEQUATE
Special Requests: ADEQUATE

## 2018-02-05 LAB — GLUCOSE, CAPILLARY
Glucose-Capillary: 190 mg/dL — ABNORMAL HIGH (ref 70–99)
Glucose-Capillary: 148 mg/dL — ABNORMAL HIGH (ref 70–99)
Glucose-Capillary: 181 mg/dL — ABNORMAL HIGH (ref 70–99)
Glucose-Capillary: 209 mg/dL — ABNORMAL HIGH (ref 70–99)
Glucose-Capillary: 246 mg/dL — ABNORMAL HIGH (ref 70–99)

## 2018-02-05 MED ORDER — ONDANSETRON HCL 4 MG/2ML IJ SOLN
4.0000 mg | Freq: Four times a day (QID) | INTRAMUSCULAR | Status: DC
  Administered 2018-02-05 – 2018-02-06 (×2): 4 mg via INTRAVENOUS
  Filled 2018-02-05 (×3): qty 2

## 2018-02-05 MED ORDER — MEGESTROL ACETATE 400 MG/10ML PO SUSP
400.0000 mg | Freq: Every day | ORAL | Status: DC
  Administered 2018-02-05 – 2018-02-07 (×3): 400 mg via ORAL
  Filled 2018-02-05 (×3): qty 10

## 2018-02-05 MED ORDER — ADULT MULTIVITAMIN W/MINERALS CH
1.0000 | ORAL_TABLET | Freq: Every day | ORAL | Status: DC
  Administered 2018-02-05 – 2018-02-07 (×3): 1 via ORAL
  Filled 2018-02-05 (×3): qty 1

## 2018-02-05 NOTE — Progress Notes (Signed)
PROGRESS NOTE        PATIENT DETAILS Name: Charles Friedman Age: 84 y.o. Sex: male Date of Birth: 24-Jan-1933 Admit Date: 01/31/2018 Admitting Physician Vilma Prader, MD NTZ:GYFVCBS, Fransico Him, MD  Brief Narrative: Patient is a 84 y.o. male history of paroxysmal atrial fibrillation on anticoagulation, chronic diastolic heart failure, history of CVA, insulin-dependent diabetes-failure to thrive syndrome-just discharged from this hospital on 12/8-presenting with worsening weakness, chest congestion, shortness of breath-with some suggestion for aspiration pneumonia-and admitted to the hospitalist service.  See below for further details  Subjective: No more nausea and no more vomiting but reports mild abdominal tenderness as well as no appetite.  Continues to have a bowel movement.  No fever no chills.  No chest pain no shortness of breath.  No cough.  Remains weak and lethargic.  Daughter at bedside today.  Assessment/Plan: Acute hypoxic respiratory failure Aspiration pneumonia. Although no obvious infiltrate seen on imaging studies-given patient was accumulating secretions/gurgling on 12/16-and sounded very congested-high suspicion for microaspiration.  Has improved following pulmonary toileting, bronchodilators and empiric Zosyn.  Patient was on IV Unasyn.  Will transition to oral cefdinir. Appreciate speech therapy evaluation.  Continue attempts with flutter valve/incentive spirometry etc.  Titrate off oxygen as tolerated.  CT chest was negative for pulmonary embolism.   Chronic diastolic heart failure: He does not appear to be volume overloaded-however given hypoxia/respiratory failure-he was given IV Lasix empirically-but since he has developed mild AK diuresis was on hold. Uses 40 mg of Lasix at home.  Will resume it tomorrow.  AKI:  Likely secondary to poor p.o. intake as well as diuresis with IV Lasix -hold Lasix today -recheck electrolytes  tomorrow.  Transaminitis: Right upper quadrant ultrasound negative. Statin is on hold. Discontinue Unasyn since that can also cause elevation of LFT.  Paroxysmal atrial fibrillation:  Rate controlled with metoprolol-continue Xarelto.    Type 2 diabetes mellitus, uncontrolled with hyperglycemia with renal complication CBG stable-continue Lantus 15 units daily, 3 units of NovoLog with meals and SSI.  Follow and adjust accordingly.   BPH: Continue Flomax.  Failure to thrive syndrome/acute on chronic debility Poor p.o. intake Per H&P-patient has been gradually declining over the past few months -worsening debility likely secondary to acute illness.  CT head unremarkable for any acute abnormality, ammonia normal, TSH and free T4 normal, will check B12 tomorrow.  Start multivitamins. Due to poor p.o. intake I will add Megace. -PT OT recommends SNF.  Chronic left foot wound/right heel wound-unstageable:: Present prior to admission-appreciate wound care evaluation.  Intractable nausea and vomiting. X-ray abdomen shows no significant stool burden. Patient did have a bowel movement as well. For now will provide supportive measures as well as use IV Zofran. Now the patient does not have any further vomiting will advance the diet and monitor.  DVT Prophylaxis: Full dose anticoagulation with Xarelto  Code Status:  DNR  Family Communication: None at bedside  Disposition Plan: Remain inpatient- back to SNF on discharge probably tomorrow once oral intake improves.  Antimicrobial agents: Anti-infectives (From admission, onward)   Start     Dose/Rate Route Frequency Ordered Stop   02/04/18 1000  cefdinir (OMNICEF) capsule 300 mg     300 mg Oral Every 12 hours 02/04/18 0913 02/08/18 2359   02/03/18 1000  azithromycin (ZITHROMAX) tablet 500 mg  Status:  Discontinued  500 mg Oral Daily 02/02/18 0813 02/02/18 1008   02/02/18 1100  Ampicillin-Sulbactam (UNASYN) 3 g in sodium chloride  0.9 % 100 mL IVPB  Status:  Discontinued     3 g 200 mL/hr over 30 Minutes Intravenous Every 6 hours 02/02/18 1010 02/04/18 0809   02/01/18 1400  piperacillin-tazobactam (ZOSYN) IVPB 3.375 g  Status:  Discontinued     3.375 g 12.5 mL/hr over 240 Minutes Intravenous Every 8 hours 02/01/18 0759 02/02/18 1008   02/01/18 0800  piperacillin-tazobactam (ZOSYN) IVPB 3.375 g     3.375 g 100 mL/hr over 30 Minutes Intravenous  Once 02/01/18 0759 02/01/18 1042   02/01/18 0745  azithromycin (ZITHROMAX) 500 mg in sodium chloride 0.9 % 250 mL IVPB  Status:  Discontinued     500 mg 250 mL/hr over 60 Minutes Intravenous Every 24 hours 02/01/18 0734 02/02/18 0813      Procedures: None  CONSULTS:  None  Time spent: 35 minutes-Greater than 50% of this time was spent in counseling, explanation of diagnosis, planning of further management, and coordination of care.  MEDICATIONS: Scheduled Meds: . cefdinir  300 mg Oral Q12H  . Chlorhexidine Gluconate Cloth  6 each Topical Q0600  . collagenase   Topical Daily  . feeding supplement  1 Container Oral TID BM  . guaiFENesin  600 mg Oral BID  . insulin aspart  0-9 Units Subcutaneous Q4H  . ipratropium-albuterol  3 mL Nebulization TID  . liver oil-zinc oxide   Topical QID  . mouth rinse  15 mL Mouth Rinse BID  . megestrol  400 mg Oral Daily  . metoprolol tartrate  12.5 mg Oral BID  . mupirocin ointment  1 application Nasal BID  . rivaroxaban  20 mg Oral Q supper  . senna-docusate  1 tablet Oral BID  . sodium chloride flush  3 mL Intravenous Q12H  . tamsulosin  0.4 mg Oral Daily   Continuous Infusions:  PRN Meds:.   PHYSICAL EXAM: Vital signs: Vitals:   02/04/18 2035 02/04/18 2343 02/05/18 0749 02/05/18 0756  BP: 122/60 (!) 110/59 (!) 152/76   Pulse: 100 100 97   Resp:  12 16   Temp:  97.7 F (36.5 C) 97.6 F (36.4 C)   TempSrc:  Oral Oral   SpO2: 98% 98% 98% 97%  Weight:      Height:       Filed Weights   02/02/18 0500 02/03/18  0600 02/04/18 0500  Weight: 108.9 kg 112.5 kg 104.3 kg   Body mass index is 31.19 kg/m.   General appearance: Awake, alert, very frail Eyes:no scleral icterus. HEENT: Atraumatic and Normocephalic Neck: supple, no JVD. Resp:Good air entry bilaterally, no transmitted sounds today. CVS: S1 S2 regular GI: Bowel sounds present, Non tender and not distended with no gaurding, rigidity or rebound. Extremities: B/L Lower Ext shows no edema, both legs are warm to touch Neurology:  Non focal-generalized weakness Musculoskeletal:No digital cyanosis Skin:No Rash, warm and dry Wounds:N/A  I have personally reviewed following labs and imaging studies  LABORATORY DATA: CBC: Recent Labs  Lab 01/31/18 1706 01/31/18 1743 02/01/18 0245 02/02/18 0229 02/03/18 0236 02/04/18 0239  WBC 12.1*  --  10.4 11.9* 10.7* 10.3  NEUTROABS 9.7*  --   --   --   --  8.0*  HGB 14.4 15.3 13.8 12.9* 13.6 13.1  HCT 45.4 45.0 44.5 39.8 41.1 42.0  MCV 91.0  --  91.0 90.0 89.9 91.5  PLT 293  --  296  273 288 502    Basic Metabolic Panel: Recent Labs  Lab 01/31/18 1716 01/31/18 1743 02/01/18 0245 02/02/18 0229 02/03/18 0236 02/04/18 0239  NA 138 140 141 139 139 144  K 4.2 4.1 4.8 3.7 3.8 3.5  CL 99 99 99 97* 99 103  CO2 32  --  29 31 29 29   GLUCOSE 180* 176* 236* 211* 206* 157*  BUN 47* 46* 47* 54* 43* 38*  CREATININE 1.11 1.10 1.14 1.35* 1.18 1.12  CALCIUM 8.7*  --  8.8* 8.7* 8.6* 8.6*  MG  --   --   --   --   --  2.2    GFR: Estimated Creatinine Clearance: 62.4 mL/min (by C-G formula based on SCr of 1.12 mg/dL).  Liver Function Tests: Recent Labs  Lab 01/31/18 1716 02/03/18 0236 02/04/18 0239 02/05/18 0756  AST 120* 95* 102* 99*  ALT 101* 88* 90* 93*  ALKPHOS 144* 116 106 106  BILITOT 1.0 1.1 1.2 1.1  PROT 7.4 6.5 6.2* 6.3*  ALBUMIN 2.8* 2.5* 2.4* 2.2*   No results for input(s): LIPASE, AMYLASE in the last 168 hours. Recent Labs  Lab 01/31/18 1716  AMMONIA 15    Coagulation  Profile: Recent Labs  Lab 01/31/18 1716  INR 1.24    Cardiac Enzymes: No results for input(s): CKTOTAL, CKMB, CKMBINDEX, TROPONINI in the last 168 hours.  BNP (last 3 results) No results for input(s): PROBNP in the last 8760 hours.  HbA1C: No results for input(s): HGBA1C in the last 72 hours.  CBG: Recent Labs  Lab 02/04/18 2036 02/04/18 2339 02/05/18 0441 02/05/18 0749 02/05/18 1141  GLUCAP 170* 177* 190* 148* 181*    Lipid Profile: No results for input(s): CHOL, HDL, LDLCALC, TRIG, CHOLHDL, LDLDIRECT in the last 72 hours.  Thyroid Function Tests: No results for input(s): TSH, T4TOTAL, FREET4, T3FREE, THYROIDAB in the last 72 hours.  Anemia Panel: No results for input(s): VITAMINB12, FOLATE, FERRITIN, TIBC, IRON, RETICCTPCT in the last 72 hours.  Urine analysis:    Component Value Date/Time   COLORURINE YELLOW 02/01/2018 [place] 02/01/2018 1014   LABSPEC 1.015 02/01/2018 1014   PHURINE 5.0 02/01/2018 1014   GLUCOSEU NEGATIVE 02/01/2018 1014   HGBUR MODERATE (A) 02/01/2018 1014   BILIRUBINUR NEGATIVE 02/01/2018 1014   KETONESUR NEGATIVE 02/01/2018 1014   PROTEINUR NEGATIVE 02/01/2018 1014   UROBILINOGEN 0.2 07/15/2014 2038   NITRITE NEGATIVE 02/01/2018 1014   LEUKOCYTESUR SMALL (A) 02/01/2018 1014    Sepsis Labs: Lactic Acid, Venous    Component Value Date/Time   LATICACIDVEN 1.5 02/04/2018 0239    MICROBIOLOGY: Recent Results (from the past 240 hour(s))  Blood culture (routine x 2)     Status: None   Collection Time: 01/31/18  5:07 PM  Result Value Ref Range Status   Specimen Description BLOOD LEFT ANTECUBITAL  Final   Special Requests   Final    BOTTLES DRAWN AEROBIC AND ANAEROBIC Blood Culture adequate volume   Culture   Final    NO GROWTH 5 DAYS Performed at [facility], [place] [address], [place], [place] [address]    Report Status 02/05/2018 FINAL  Final  Blood culture (routine x 2)     Status: None   Collection  Time: 01/31/18  5:12 PM  Result Value Ref Range Status   Specimen Description BLOOD SITE NOT SPECIFIED  Final   Special Requests   Final    BOTTLES DRAWN AEROBIC AND ANAEROBIC Blood Culture adequate  volume   Culture   Final    NO GROWTH 5 DAYS Performed at [facility], [place] [address] [place], [place] [address]    Report Status 02/05/2018 FINAL  Final  Respiratory Panel by PCR     Status: None   Collection Time: 02/01/18  1:13 AM  Result Value Ref Range Status   Adenovirus NOT DETECTED NOT DETECTED Final   Coronavirus 229E NOT DETECTED NOT DETECTED Final   Coronavirus HKU1 NOT DETECTED NOT DETECTED Final   Coronavirus NL63 NOT DETECTED NOT DETECTED Final   Coronavirus OC43 NOT DETECTED NOT DETECTED Final   Metapneumovirus NOT DETECTED NOT DETECTED Final   Rhinovirus / Enterovirus NOT DETECTED NOT DETECTED Final   Influenza A NOT DETECTED NOT DETECTED Final   Influenza B NOT DETECTED NOT DETECTED Final   Parainfluenza Virus 1 NOT DETECTED NOT DETECTED Final   Parainfluenza Virus 2 NOT DETECTED NOT DETECTED Final   Parainfluenza Virus 3 NOT DETECTED NOT DETECTED Final   Parainfluenza Virus 4 NOT DETECTED NOT DETECTED Final   Respiratory Syncytial Virus NOT DETECTED NOT DETECTED Final   Bordetella pertussis NOT DETECTED NOT DETECTED Final   Chlamydophila pneumoniae NOT DETECTED NOT DETECTED Final   Mycoplasma pneumoniae NOT DETECTED NOT DETECTED Final    Comment: Performed at [facility], [place] [address], [place], [place] [address]  MRSA PCR Screening     Status: Abnormal   Collection Time: 02/02/18 12:52 AM  Result Value Ref Range Status   MRSA by PCR POSITIVE (A) NEGATIVE Final    Comment:        The GeneXpert MRSA Assay (FDA approved for NASAL specimens only), is one component of a comprehensive MRSA colonization surveillance program. It is not intended to diagnose MRSA infection nor to guide or monitor treatment for MRSA infections. RESULT CALLED TO, READ  BACK BY AND VERIFIED WITH: Neita Carp RN 11:00 02/02/18 ([email or web address]) Performed at [facility], [place] [address], [place],  [address]     RADIOLOGY STUDIES/RESULTS: Ct Head Wo Contrast  Result Date: 01/24/2018 CLINICAL DATA:  Altered mental status. EXAM: CT HEAD WITHOUT CONTRAST TECHNIQUE: Contiguous axial images were obtained from the base of the skull through the vertex without intravenous contrast. COMPARISON:  10/20/2017 FINDINGS: Brain: There is no evidence for acute hemorrhage, hydrocephalus, mass lesion, or abnormal extra-axial fluid collection. No definite CT evidence for acute infarction. Diffuse loss of parenchymal volume is consistent with atrophy. Patchy low attenuation in the deep hemispheric and periventricular white matter is nonspecific, but likely reflects chronic microvascular ischemic demyelination. Vascular: No hyperdense vessel or unexpected calcification. Skull: No evidence for fracture. No worrisome lytic or sclerotic lesion. Sinuses/Orbits: The visualized paranasal sinuses and mastoid air cells are clear. Visualized portions of the globes and intraorbital fat are unremarkable. Other: None. IMPRESSION: 1. Stable.  No acute intracranial abnormality. 2. Atrophy with chronic small vessel white matter ischemic disease. Electronically Signed   By: Misty Stanley M.D.   On: 01/24/2018 17:51   Ct Angio Chest Pe W And/or Wo Contrast  Result Date: 01/31/2018 CLINICAL DATA:  Shortness of breath EXAM: CT ANGIOGRAPHY CHEST WITH CONTRAST TECHNIQUE: Multidetector CT imaging of the chest was performed using the standard protocol during bolus administration of intravenous contrast. Multiplanar CT image reconstructions and MIPs were obtained to evaluate the vascular anatomy. CONTRAST:  116mL ISOVUE-370 IOPAMIDOL (ISOVUE-370) INJECTION 76% COMPARISON:  None. FINDINGS: Cardiovascular: Heart is mildly enlarged. Aorta is normal caliber. Moderate coronary artery calcifications  and aortic  calcifications. No filling defects in the pulmonary arteries to suggest pulmonary emboli. Mediastinum/Nodes: No mediastinal, hilar, or axillary adenopathy. Lungs/Pleura: Predominantly linear densities in both lower lobes most compatible with atelectasis. Some tree-in-bud ground-glass nodular densities are noted in the superior segment of the right lower lobe which could reflect early alveolitis/small airways disease. No effusions. Upper Abdomen: Imaging into the upper abdomen shows no acute findings. Musculoskeletal: Chest wall soft tissues are unremarkable. No acute bony abnormality. Review of the MIP images confirms the above findings. IMPRESSION: No evidence of pulmonary embolus. Cardiomegaly, coronary artery disease. Bilateral lower lobe atelectasis. Tree-in-bud nodular densities in the superior segment of the right lower lobe could reflect small airways disease/alveolitis. Aortic Atherosclerosis (ICD10-I70.0). Electronically Signed   By: Rolm Baptise M.D.   On: 01/31/2018 18:56   Dg Chest Port 1 View  Result Date: 02/01/2018 CLINICAL DATA:  84 year old male with a history of aspiration EXAM: PORTABLE CHEST 1 VIEW COMPARISON:  01/31/2018, 01/17/2018 FINDINGS: Cardiomediastinal silhouette unchanged in size and contour. Low lung volumes accentuate the interstitium. Mild interlobular septal thickening and fullness in the central vasculature. No large pleural effusion or pneumothorax. No confluent airspace disease. No acute displaced fracture IMPRESSION: Low lung volumes with interlobular/interstitial opacities, potentially representing mild pulmonary edema, atelectasis/scarring. Electronically Signed   By: Corrie Mckusick D.O.   On: 02/01/2018 09:04   Dg Chest Port 1 View  Result Date: 01/31/2018 CLINICAL DATA:  Shortness of breath EXAM: PORTABLE CHEST 1 VIEW COMPARISON:  01/27/2018 FINDINGS: There is improved aeration of the lungs. No pulmonary edema or focal consolidation. Unchanged mild cardiomegaly. No  pleural effusion or pneumothorax. IMPRESSION: Improved aeration without focal airspace disease. Electronically Signed   By: Ulyses Jarred M.D.   On: 01/31/2018 17:38   Dg Chest Port 1 View  Result Date: 01/27/2018 CLINICAL DATA:  Shortness of breath on exertion EXAM: PORTABLE CHEST 1 VIEW COMPARISON:  01/24/2018 FINDINGS: Cardiomegaly with vascular congestion. Increasing interstitial prominence in the lungs could reflect interstitial edema. Bibasilar atelectasis. No visible effusions or acute bony abnormality. IMPRESSION: Cardiomegaly with vascular congestion and interstitial prominence, likely interstitial edema. Bibasilar atelectasis. Electronically Signed   By: Rolm Baptise M.D.   On: 01/27/2018 09:44   Dg Chest Port 1 View  Result Date: 01/24/2018 CLINICAL DATA:  Shortness of breath and weakness. Altered mental status. EXAM: PORTABLE CHEST 1 VIEW COMPARISON:  10/21/2017 FINDINGS: The heart is borderline enlarged but stable. There is tortuosity and calcification of the thoracic aorta. Chronic bronchitic type lung changes but no infiltrates or effusions. IMPRESSION: Mild stable cardiac enlargement but no acute pulmonary findings. Electronically Signed   By: Marijo Sanes M.D.   On: 01/24/2018 17:35   Dg Abd Portable 1v  Result Date: 02/03/2018 CLINICAL DATA:  Nausea and vomiting EXAM: PORTABLE ABDOMEN - 1 VIEW COMPARISON:  None. FINDINGS: The bowel gas pattern is normal. No radio-opaque calculi or other significant radiographic abnormality are seen. IMPRESSION: Negative. Electronically Signed   By: Nelson Chimes M.D.   On: 02/03/2018 12:51   US Abdomen Limited Ruq  Result Date: 02/02/2018 CLINICAL DATA:  Transaminitis EXAM: ULTRASOUND ABDOMEN LIMITED RIGHT UPPER QUADRANT COMPARISON:  None. FINDINGS: Gallbladder: No gallstones or wall thickening visualized. No sonographic Murphy sign noted by sonographer. Common bile duct: Diameter: 5.9 mm Liver: Limited evaluation due to body habitus. No focal  abnormalities noted. Portal vein is patent on color Doppler imaging with normal direction of blood flow towards the liver. IMPRESSION: Study limited by patient body habitus.  No cause for transaminitis identified. Electronically Signed   By: Dorise Bullion III M.D   On: 02/02/2018 15:35     LOS: 4 days   Berle Mull, MD  [facility]  If 7PM-7AM, please contact night-coverage  Please page via [email or web address] [email or web address]-click on MD name and type text message  02/05/2018, 1:56 PM

## 2018-02-05 NOTE — Plan of Care (Signed)
  Problem: Elimination: Goal: Will not experience complications related to bowel motility Outcome: Progressing   

## 2018-02-05 NOTE — Progress Notes (Signed)
  Speech Language Pathology Treatment: Dysphagia  Patient Details Name: Charles Friedman MRN: [id number] DOB: 1932/10/06 Today's Date: 02/05/2018 Time: 9675-9163 SLP Time Calculation (min) (ACUTE ONLY): 21 min  Assessment / Plan / Recommendation Clinical Impression  Pt was seen during med administration with RN. He had already declined breakfast this morning. He consumed sips of thin liquid via straw without overt difficulty, but exhibited oral holding when trying to consume pills with a liquid wash. Coughing was observed throughout this oral holding, concerning for posterior spillage into the airway. SLP provided environmental and postural adjustments and Mod cues for posterior transit, but he could not swallow the pills despite efforts. He expectorated them and reattempted one at a time in a bite of puree with swift oral clearance noted. Discussed with RN: suspect that mentation may fluctuate which means his swallowing may fluctuate as well. Would maintain current diet with full supervision but would take meds whole in puree. SLP will continue to follow.   HPI HPI: Patient is an 84 y.o. male presenting with worsening weakness, chest congestion, and shortness of breath. MD noted audible secretions and is concerned about aspiration PNA; tree-in-bud densities noted in RLL. Esophagram in 2014 showed moderate to severe GER, feline esophagus, moderate hiatal hernia, and mild esophageal dysmotility. PMH includes afib, CHF, CVA, DM, FTT      SLP Plan  Continue with current plan of care       Recommendations  Diet recommendations: Thin liquid;Dysphagia 3 (mechanical soft) Liquids provided via: Cup;Straw Medication Administration: Whole meds with puree Supervision: Patient able to self feed;Full supervision/cueing for compensatory strategies Compensations: Slow rate;Small sips/bites;Minimize environmental distractions Postural Changes and/or Swallow Maneuvers: Seated upright 90 degrees                 Oral Care Recommendations: Oral care BID Follow up Recommendations: 24 hour supervision/assistance SLP Visit Diagnosis: Dysphagia, unspecified (R13.10) Plan: Continue with current plan of care       GO                Charles Friedman 02/05/2018, 10:29 AM  Charles Friedman, M.A. [facility] Acute Environmental education officer [phone] Office [phone]

## 2018-02-05 NOTE — Clinical Social Work Placement (Signed)
   CLINICAL SOCIAL WORK PLACEMENT  NOTE  Date:  02/05/2018  Patient Details  Name: Charles Friedman MRN: [id number] Date of Birth: Feb 02, 1933  Clinical Social Work is seeking post-discharge placement for this patient at the [place] level of care (*CSW will initial, date and re-position this form in  chart as items are completed):      Patient/family provided with [facility] Work Department's list of facilities offering this level of care within the geographic area requested by the patient (or if unable, by the patient's family).  Yes   Patient/family informed of their freedom to choose among providers that offer the needed level of care, that participate in Medicare, Medicaid or managed care program needed by the patient, have an available bed and are willing to accept the patient.      Patient/family informed of [facility]'s ownership interest in [facility] and [facility], as well as of the fact that they are under no obligation to receive care at these facilities.  PASRR submitted to EDS on       PASRR number received on       Existing PASRR number confirmed on 02/02/18     FL2 transmitted to all facilities in geographic area requested by pt/family on 02/02/18     FL2 transmitted to all facilities within larger geographic area on       Patient informed that his/her managed care company has contracts with or will negotiate with certain facilities, including the following:        Yes   Patient/family informed of bed offers received.  Patient chooses bed at [place], [place]     Physician recommends and patient chooses bed at      Patient to be transferred to [facility] on 02/05/18.  Patient to be transferred to facility by [facility]     Patient family notified on 02/05/18 of transfer.  Name of family member notified:        PHYSICIAN       Additional Comment:     _______________________________________________ Eileen Stanford, LCSW 02/05/2018, 10:10 AM

## 2018-02-06 LAB — CBC
HCT: 39.1 % (ref 39.0–52.0)
HEMOGLOBIN: 12.6 g/dL — AB (ref 13.0–17.0)
MCH: 29.4 pg (ref 26.0–34.0)
MCHC: 32.2 g/dL (ref 30.0–36.0)
MCV: 91.4 fL (ref 80.0–100.0)
Platelets: 298 10*3/uL (ref 150–400)
RBC: 4.28 MIL/uL (ref 4.22–5.81)
RDW: 13.3 % (ref 11.5–15.5)
WBC: 11.8 10*3/uL — ABNORMAL HIGH (ref 4.0–10.5)
nRBC: 0 % (ref 0.0–0.2)

## 2018-02-06 LAB — COMPREHENSIVE METABOLIC PANEL
ALK PHOS: 102 U/L (ref 38–126)
ALT: 90 U/L — ABNORMAL HIGH (ref 0–44)
AST: 93 U/L — ABNORMAL HIGH (ref 15–41)
Albumin: 2.1 g/dL — ABNORMAL LOW (ref 3.5–5.0)
Anion gap: 9 (ref 5–15)
BUN: 38 mg/dL — ABNORMAL HIGH (ref 8–23)
CO2: 32 mmol/L (ref 22–32)
Calcium: 8.7 mg/dL — ABNORMAL LOW (ref 8.9–10.3)
Chloride: 100 mmol/L (ref 98–111)
Creatinine, Ser: 1.04 mg/dL (ref 0.61–1.24)
GFR calc Af Amer: >60 mL/min (ref >60)
GFR calc non Af Amer: >60 mL/min (ref >60)
Glucose, Bld: 215 mg/dL — ABNORMAL HIGH (ref 70–99)
Potassium: 3.3 mmol/L — ABNORMAL LOW (ref 3.5–5.1)
Sodium: 141 mmol/L (ref 135–145)
Total Bilirubin: 0.7 mg/dL (ref 0.3–1.2)
Total Protein: 6.1 g/dL — ABNORMAL LOW (ref 6.5–8.1)

## 2018-02-06 LAB — GLUCOSE, CAPILLARY
GLUCOSE-CAPILLARY: 246 mg/dL — AB (ref 70–99)
GLUCOSE-CAPILLARY: 210 mg/dL — AB (ref 70–99)
Glucose-Capillary: 162 mg/dL — ABNORMAL HIGH (ref 70–99)
Glucose-Capillary: 162 mg/dL — ABNORMAL HIGH (ref 70–99)
Glucose-Capillary: 225 mg/dL — ABNORMAL HIGH (ref 70–99)
Glucose-Capillary: 258 mg/dL — ABNORMAL HIGH (ref 70–99)

## 2018-02-06 LAB — VITAMIN B12: Vitamin B-12: 131 pg/mL — ABNORMAL LOW (ref 180–914)

## 2018-02-06 MED ORDER — CYANOCOBALAMIN 1000 MCG/ML IJ SOLN: 1000.0000 ug | Freq: Once | INTRAMUSCULAR | Status: DC

## 2018-02-06 MED ORDER — INSULIN ASPART 100 UNIT/ML ~~LOC~~ SOLN
0.0000 [IU] | Freq: Three times a day (TID) | SUBCUTANEOUS | Status: DC
  Administered 2018-02-06 – 2018-02-07 (×3): 3 [IU] via SUBCUTANEOUS

## 2018-02-06 MED ORDER — INSULIN ASPART 100 UNIT/ML ~~LOC~~ SOLN
0.0000 [IU] | Freq: Every day | SUBCUTANEOUS | Status: DC
  Administered 2018-02-06: 2 [IU] via SUBCUTANEOUS

## 2018-02-06 MED ORDER — FUROSEMIDE 10 MG/ML IJ SOLN
20.0000 mg | Freq: Once | INTRAMUSCULAR | Status: AC
  Administered 2018-02-06: 20 mg via INTRAVENOUS
  Filled 2018-02-06: qty 2

## 2018-02-06 MED ORDER — FUROSEMIDE 40 MG PO TABS: 40.0000 mg | ORAL_TABLET | Freq: Every day | ORAL | Status: DC

## 2018-02-06 MED ORDER — POTASSIUM CHLORIDE CRYS ER 20 MEQ PO TBCR
40.0000 meq | EXTENDED_RELEASE_TABLET | Freq: Once | ORAL | Status: AC
  Administered 2018-02-06: 40 meq via ORAL
  Filled 2018-02-06: qty 2

## 2018-02-06 MED ORDER — FUROSEMIDE 40 MG PO TABS
40.0000 mg | ORAL_TABLET | Freq: Every day | ORAL | Status: DC
  Administered 2018-02-07: 40 mg via ORAL
  Filled 2018-02-06: qty 1

## 2018-02-06 MED ORDER — CYANOCOBALAMIN 1000 MCG/ML IJ SOLN
1000.0000 ug | Freq: Every day | INTRAMUSCULAR | Status: AC
  Administered 2018-02-06 – 2018-02-07 (×2): 1000 ug via SUBCUTANEOUS
  Filled 2018-02-06 (×2): qty 1

## 2018-02-06 MED ORDER — ONDANSETRON HCL 4 MG/2ML IJ SOLN: 4.0000 mg | Freq: Four times a day (QID) | INTRAMUSCULAR | Status: DC | PRN

## 2018-02-06 MED ORDER — IPRATROPIUM-ALBUTEROL 0.5-2.5 (3) MG/3ML IN SOLN: 3.0000 mL | Freq: Four times a day (QID) | RESPIRATORY_TRACT | Status: DC | PRN

## 2018-02-06 NOTE — Progress Notes (Signed)
Patient did not sleep well last night.  Dressing change to left foot per order, betadine paint to great toe, plantar area, and heel, wrapped in gauze.  Boots in place.  Patient's coccyx/sacral area appear to be less excoriated, cleansed and Desitin applied.  Patient was on two pads and a disposable pad.  Education to Nurse [employer] to pass on in report that patient should only be on 1 blue pad.  Patient has agreed to be turned at 0200 and 0400.  He declined at 0000.  Patient declined his Zofran at 0530 this am stating he was no longer nauseous.  He states someone brought him a [employer] frosty and it cured him.

## 2018-02-06 NOTE — Progress Notes (Signed)
PROGRESS NOTE        PATIENT DETAILS Name: Charles Friedman Age: 84 y.o. Sex: male Date of Birth: 1932-02-18 Admit Date: 01/31/2018 Admitting Physician Vilma Prader, MD CHE:NIDPOEU, Fransico Him, MD  Brief Narrative: Patient is a 84 y.o. male history of paroxysmal atrial fibrillation on anticoagulation, chronic diastolic heart failure, history of CVA, insulin-dependent diabetes-failure to thrive syndrome-just discharged from this hospital on 12/8-presenting with worsening weakness, chest congestion, shortness of breath-with some suggestion for aspiration pneumonia-and admitted to the hospitalist service.  See below for further details  Subjective: No more nausea no more vomiting.  Feeling better.  Assessment/Plan: Acute hypoxic respiratory failure Aspiration pneumonia. Although no obvious infiltrate seen on imaging studies-given patient was accumulating secretions/gurgling on 12/16-and sounded very congested-high suspicion for microaspiration.  Has improved following pulmonary toileting, bronchodilators and empiric Zosyn.  Patient was on IV Unasyn.  Will transition to oral cefdinir. Appreciate speech therapy evaluation.  Continue attempts with flutter valve/incentive spirometry etc.  Titrate off oxygen as tolerated.  CT chest was negative for pulmonary embolism.   Chronic diastolic heart failure: He does not appear to be volume overloaded-however given hypoxia/respiratory failure-he was given IV Lasix empirically-but since he has developed mild AK diuresis was on hold. Uses 40 mg of Lasix at home.  Will resume it tomorrow.  AKI:  Likely secondary to poor p.o. intake as well as diuresis with IV Lasix -hold Lasix today -recheck electrolytes tomorrow.  Transaminitis: Right upper quadrant ultrasound negative. Statin is on hold. Discontinue Unasyn since that can also cause elevation of LFT.  Paroxysmal atrial fibrillation:  Rate controlled with  metoprolol-continue Xarelto.    Type 2 diabetes mellitus, uncontrolled with hyperglycemia with renal complication CBG stable-continue Lantus 15 units daily, 3 units of NovoLog with meals and SSI.  Follow and adjust accordingly.   BPH: Continue Flomax.  Failure to thrive syndrome/acute on chronic debility Poor p.o. intake Per H&P-patient has been gradually declining over the past few months -worsening debility likely secondary to acute illness.  CT head unremarkable for any acute abnormality, ammonia normal, TSH and free T4 normal, will check B12 tomorrow.  Start multivitamins. Due to poor p.o. intake I will add Megace. -PT OT recommends SNF.  Chronic left foot wound/right heel wound-unstageable:: Present prior to admission-appreciate wound care evaluation.  Intractable nausea and vomiting. X-ray abdomen shows no significant stool burden. Patient did have a bowel movement as well. He was on scheduled Zofran until this morning. We will transition to PRN and monitor oral intake.  DVT Prophylaxis: Full dose anticoagulation with Xarelto  Code Status:  DNR  Family Communication: None at bedside  Disposition Plan: Remain inpatient- back to SNF on discharge probably tomorrow once oral intake improves.  Antimicrobial agents: Anti-infectives (From admission, onward)   Start     Dose/Rate Route Frequency Ordered Stop   02/04/18 1000  cefdinir (OMNICEF) capsule 300 mg     300 mg Oral Every 12 hours 02/04/18 0913 02/08/18 2359   02/03/18 1000  azithromycin (ZITHROMAX) tablet 500 mg  Status:  Discontinued     500 mg Oral Daily 02/02/18 0813 02/02/18 1008   02/02/18 1100  Ampicillin-Sulbactam (UNASYN) 3 g in sodium chloride 0.9 % 100 mL IVPB  Status:  Discontinued     3 g 200 mL/hr over 30 Minutes Intravenous Every 6 hours 02/02/18 1010 02/04/18 0809  02/01/18 1400  piperacillin-tazobactam (ZOSYN) IVPB 3.375 g  Status:  Discontinued     3.375 g 12.5 mL/hr over 240 Minutes Intravenous  Every 8 hours 02/01/18 0759 02/02/18 1008   02/01/18 0800  piperacillin-tazobactam (ZOSYN) IVPB 3.375 g     3.375 g 100 mL/hr over 30 Minutes Intravenous  Once 02/01/18 0759 02/01/18 1042   02/01/18 0745  azithromycin (ZITHROMAX) 500 mg in sodium chloride 0.9 % 250 mL IVPB  Status:  Discontinued     500 mg 250 mL/hr over 60 Minutes Intravenous Every 24 hours 02/01/18 0734 02/02/18 0813      Procedures: None  CONSULTS:  None  Time spent: 35 minutes-Greater than 50% of this time was spent in counseling, explanation of diagnosis, planning of further management, and coordination of care.  MEDICATIONS: Scheduled Meds: . cefdinir  300 mg Oral Q12H  . collagenase   Topical Daily  . cyanocobalamin  1,000 mcg Subcutaneous Daily  . feeding supplement  1 Container Oral TID BM  . [START ON 02/07/2018] furosemide  40 mg Oral Daily  . guaiFENesin  600 mg Oral BID  . insulin aspart  0-5 Units Subcutaneous QHS  . insulin aspart  0-9 Units Subcutaneous TID WC  . liver oil-zinc oxide   Topical QID  . mouth rinse  15 mL Mouth Rinse BID  . megestrol  400 mg Oral Daily  . metoprolol tartrate  12.5 mg Oral BID  . multivitamin with minerals  1 tablet Oral Daily  . mupirocin ointment  1 application Nasal BID  . rivaroxaban  20 mg Oral Q supper  . senna-docusate  1 tablet Oral BID  . sodium chloride flush  3 mL Intravenous Q12H  . tamsulosin  0.4 mg Oral Daily   Continuous Infusions:  PRN Meds:.   PHYSICAL EXAM: Vital signs: Vitals:   02/06/18 0300 02/06/18 0748 02/06/18 0833 02/06/18 1656  BP:   117/63 126/64  Pulse:   (!) 105 98  Resp:   (!) 30 18  Temp:   97.8 F (36.6 C) 97.7 F (36.5 C)  TempSrc:      SpO2:  98% 97% 97%  Weight: 128.4 kg     Height:       Filed Weights   02/03/18 0600 02/04/18 0500 02/06/18 0300  Weight: 112.5 kg 104.3 kg 128.4 kg   Body mass index is 38.38 kg/m.   General appearance: Awake, alert, very frail Eyes:no scleral icterus. HEENT: Atraumatic  and Normocephalic Neck: supple, no JVD. Resp:Good air entry bilaterally, no transmitted sounds today. CVS: S1 S2 regular GI: Bowel sounds present, Non tender and not distended with no gaurding, rigidity or rebound. Extremities: B/L Lower Ext shows no edema, both legs are warm to touch Neurology:  Non focal-generalized weakness Musculoskeletal:No digital cyanosis Skin:No Rash, warm and dry Wounds:N/A  I have personally reviewed following labs and imaging studies  LABORATORY DATA: CBC: Recent Labs  Lab 01/31/18 1706  02/01/18 0245 02/02/18 0229 02/03/18 0236 02/04/18 0239 02/06/18 0250  WBC 12.1*  --  10.4 11.9* 10.7* 10.3 11.8*  NEUTROABS 9.7*  --   --   --   --  8.0*  --   HGB 14.4   < > 13.8 12.9* 13.6 13.1 12.6*  HCT 45.4   < > 44.5 39.8 41.1 42.0 39.1  MCV 91.0  --  91.0 90.0 89.9 91.5 91.4  PLT 293  --  296 273 288 267 298   < > = values in this interval not  displayed.    Basic Metabolic Panel: Recent Labs  Lab 02/01/18 0245 02/02/18 0229 02/03/18 0236 02/04/18 0239 02/06/18 0250  NA 141 139 139 144 141  K 4.8 3.7 3.8 3.5 3.3*  CL 99 97* 99 103 100  CO2 29 31 29 29  32  GLUCOSE 236* 211* 206* 157* 215*  BUN 47* 54* 43* 38* 38*  CREATININE 1.14 1.35* 1.18 1.12 1.04  CALCIUM 8.8* 8.7* 8.6* 8.6* 8.7*  MG  --   --   --  2.2  --     GFR: Estimated Creatinine Clearance: 74.5 mL/min (by C-G formula based on SCr of 1.04 mg/dL).  Liver Function Tests: Recent Labs  Lab 01/31/18 1716 02/03/18 0236 02/04/18 0239 02/05/18 0756 02/06/18 0250  AST 120* 95* 102* 99* 93*  ALT 101* 88* 90* 93* 90*  ALKPHOS 144* 116 106 106 102  BILITOT 1.0 1.1 1.2 1.1 0.7  PROT 7.4 6.5 6.2* 6.3* 6.1*  ALBUMIN 2.8* 2.5* 2.4* 2.2* 2.1*   No results for input(s): LIPASE, AMYLASE in the last 168 hours. Recent Labs  Lab 01/31/18 1716  AMMONIA 15    Coagulation Profile: Recent Labs  Lab 01/31/18 1716  INR 1.24    Cardiac Enzymes: No results for input(s): CKTOTAL, CKMB,  CKMBINDEX, TROPONINI in the last 168 hours.  BNP (last 3 results) No results for input(s): PROBNP in the last 8760 hours.  HbA1C: No results for input(s): HGBA1C in the last 72 hours.  CBG: Recent Labs  Lab 02/06/18 0003 02/06/18 0419 02/06/18 0751 02/06/18 1211 02/06/18 1629  GLUCAP 258* 162* 162* 246* 210*    Lipid Profile: No results for input(s): CHOL, HDL, LDLCALC, TRIG, CHOLHDL, LDLDIRECT in the last 72 hours.  Thyroid Function Tests: No results for input(s): TSH, T4TOTAL, FREET4, T3FREE, THYROIDAB in the last 72 hours.  Anemia Panel: Recent Labs    02/06/18 0250  VITAMINB12 131*    Urine analysis:    Component Value Date/Time   COLORURINE YELLOW 02/01/2018 [place] 02/01/2018 1014   LABSPEC 1.015 02/01/2018 1014   PHURINE 5.0 02/01/2018 1014   GLUCOSEU NEGATIVE 02/01/2018 1014   HGBUR MODERATE (A) 02/01/2018 1014   BILIRUBINUR NEGATIVE 02/01/2018 1014   KETONESUR NEGATIVE 02/01/2018 1014   PROTEINUR NEGATIVE 02/01/2018 1014   UROBILINOGEN 0.2 07/15/2014 2038   NITRITE NEGATIVE 02/01/2018 1014   LEUKOCYTESUR SMALL (A) 02/01/2018 1014    Sepsis Labs: Lactic Acid, Venous    Component Value Date/Time   LATICACIDVEN 1.5 02/04/2018 0239    MICROBIOLOGY: Recent Results (from the past 240 hour(s))  Blood culture (routine x 2)     Status: None   Collection Time: 01/31/18  5:07 PM  Result Value Ref Range Status   Specimen Description BLOOD LEFT ANTECUBITAL  Final   Special Requests   Final    BOTTLES DRAWN AEROBIC AND ANAEROBIC Blood Culture adequate volume   Culture   Final    NO GROWTH 5 DAYS Performed at [facility], [place] [address], [place], [place] [address]    Report Status 02/05/2018 FINAL  Final  Blood culture (routine x 2)     Status: None   Collection Time: 01/31/18  5:12 PM  Result Value Ref Range Status   Specimen Description BLOOD SITE NOT SPECIFIED  Final   Special Requests   Final    BOTTLES DRAWN AEROBIC AND  ANAEROBIC Blood Culture adequate volume   Culture   Final    NO GROWTH 5 DAYS  Performed at [facility], [place] [address], [place], [place] [address]    Report Status 02/05/2018 FINAL  Final  Respiratory Panel by PCR     Status: None   Collection Time: 02/01/18  1:13 AM  Result Value Ref Range Status   Adenovirus NOT DETECTED NOT DETECTED Final   Coronavirus 229E NOT DETECTED NOT DETECTED Final   Coronavirus HKU1 NOT DETECTED NOT DETECTED Final   Coronavirus NL63 NOT DETECTED NOT DETECTED Final   Coronavirus OC43 NOT DETECTED NOT DETECTED Final   Metapneumovirus NOT DETECTED NOT DETECTED Final   Rhinovirus / Enterovirus NOT DETECTED NOT DETECTED Final   Influenza A NOT DETECTED NOT DETECTED Final   Influenza B NOT DETECTED NOT DETECTED Final   Parainfluenza Virus 1 NOT DETECTED NOT DETECTED Final   Parainfluenza Virus 2 NOT DETECTED NOT DETECTED Final   Parainfluenza Virus 3 NOT DETECTED NOT DETECTED Final   Parainfluenza Virus 4 NOT DETECTED NOT DETECTED Final   Respiratory Syncytial Virus NOT DETECTED NOT DETECTED Final   Bordetella pertussis NOT DETECTED NOT DETECTED Final   Chlamydophila pneumoniae NOT DETECTED NOT DETECTED Final   Mycoplasma pneumoniae NOT DETECTED NOT DETECTED Final    Comment: Performed at [facility], [place] [address], [place], [place] [address]  MRSA PCR Screening     Status: Abnormal   Collection Time: 02/02/18 12:52 AM  Result Value Ref Range Status   MRSA by PCR POSITIVE (A) NEGATIVE Final    Comment:        The GeneXpert MRSA Assay (FDA approved for NASAL specimens only), is one component of a comprehensive MRSA colonization surveillance program. It is not intended to diagnose MRSA infection nor to guide or monitor treatment for MRSA infections. RESULT CALLED TO, READ BACK BY AND VERIFIED WITH: Neita Carp RN 11:00 02/02/18 ([email or web address]) Performed at [facility], [place] [address] [place], [place] [address]     RADIOLOGY  STUDIES/RESULTS: Ct Head Wo Contrast  Result Date: 01/24/2018 CLINICAL DATA:  Altered mental status. EXAM: CT HEAD WITHOUT CONTRAST TECHNIQUE: Contiguous axial images were obtained from the base of the skull through the vertex without intravenous contrast. COMPARISON:  10/20/2017 FINDINGS: Brain: There is no evidence for acute hemorrhage, hydrocephalus, mass lesion, or abnormal extra-axial fluid collection. No definite CT evidence for acute infarction. Diffuse loss of parenchymal volume is consistent with atrophy. Patchy low attenuation in the deep hemispheric and periventricular white matter is nonspecific, but likely reflects chronic microvascular ischemic demyelination. Vascular: No hyperdense vessel or unexpected calcification. Skull: No evidence for fracture. No worrisome lytic or sclerotic lesion. Sinuses/Orbits: The visualized paranasal sinuses and mastoid air cells are clear. Visualized portions of the globes and intraorbital fat are unremarkable. Other: None. IMPRESSION: 1. Stable.  No acute intracranial abnormality. 2. Atrophy with chronic small vessel white matter ischemic disease. Electronically Signed   By: Misty Stanley M.D.   On: 01/24/2018 17:51   Ct Angio Chest Pe W And/or Wo Contrast  Result Date: 01/31/2018 CLINICAL DATA:  Shortness of breath EXAM: CT ANGIOGRAPHY CHEST WITH CONTRAST TECHNIQUE: Multidetector CT imaging of the chest was performed using the standard protocol during bolus administration of intravenous contrast. Multiplanar CT image reconstructions and MIPs were obtained to evaluate the vascular anatomy. CONTRAST:  170mL ISOVUE-370 IOPAMIDOL (ISOVUE-370) INJECTION 76% COMPARISON:  None. FINDINGS: Cardiovascular: Heart is mildly enlarged. Aorta is normal caliber. Moderate coronary artery calcifications and aortic calcifications. No filling defects in the pulmonary arteries to suggest pulmonary emboli.  Mediastinum/Nodes: No mediastinal, hilar, or axillary adenopathy.  Lungs/Pleura: Predominantly linear densities in both lower lobes most compatible with atelectasis. Some tree-in-bud ground-glass nodular densities are noted in the superior segment of the right lower lobe which could reflect early alveolitis/small airways disease. No effusions. Upper Abdomen: Imaging into the upper abdomen shows no acute findings. Musculoskeletal: Chest wall soft tissues are unremarkable. No acute bony abnormality. Review of the MIP images confirms the above findings. IMPRESSION: No evidence of pulmonary embolus. Cardiomegaly, coronary artery disease. Bilateral lower lobe atelectasis. Tree-in-bud nodular densities in the superior segment of the right lower lobe could reflect small airways disease/alveolitis. Aortic Atherosclerosis (ICD10-I70.0). Electronically Signed   By: Rolm Baptise M.D.   On: 01/31/2018 18:56   Dg Chest Port 1 View  Result Date: 02/01/2018 CLINICAL DATA:  84 year old male with a history of aspiration EXAM: PORTABLE CHEST 1 VIEW COMPARISON:  01/31/2018, 01/17/2018 FINDINGS: Cardiomediastinal silhouette unchanged in size and contour. Low lung volumes accentuate the interstitium. Mild interlobular septal thickening and fullness in the central vasculature. No large pleural effusion or pneumothorax. No confluent airspace disease. No acute displaced fracture IMPRESSION: Low lung volumes with interlobular/interstitial opacities, potentially representing mild pulmonary edema, atelectasis/scarring. Electronically Signed   By: Corrie Mckusick D.O.   On: 02/01/2018 09:04   Dg Chest Port 1 View  Result Date: 01/31/2018 CLINICAL DATA:  Shortness of breath EXAM: PORTABLE CHEST 1 VIEW COMPARISON:  01/27/2018 FINDINGS: There is improved aeration of the lungs. No pulmonary edema or focal consolidation. Unchanged mild cardiomegaly. No pleural effusion or pneumothorax. IMPRESSION: Improved aeration without focal airspace disease. Electronically Signed   By: Ulyses Jarred M.D.   On:  01/31/2018 17:38   Dg Chest Port 1 View  Result Date: 01/27/2018 CLINICAL DATA:  Shortness of breath on exertion EXAM: PORTABLE CHEST 1 VIEW COMPARISON:  01/24/2018 FINDINGS: Cardiomegaly with vascular congestion. Increasing interstitial prominence in the lungs could reflect interstitial edema. Bibasilar atelectasis. No visible effusions or acute bony abnormality. IMPRESSION: Cardiomegaly with vascular congestion and interstitial prominence, likely interstitial edema. Bibasilar atelectasis. Electronically Signed   By: Rolm Baptise M.D.   On: 01/27/2018 09:44   Dg Chest Port 1 View  Result Date: 01/24/2018 CLINICAL DATA:  Shortness of breath and weakness. Altered mental status. EXAM: PORTABLE CHEST 1 VIEW COMPARISON:  10/21/2017 FINDINGS: The heart is borderline enlarged but stable. There is tortuosity and calcification of the thoracic aorta. Chronic bronchitic type lung changes but no infiltrates or effusions. IMPRESSION: Mild stable cardiac enlargement but no acute pulmonary findings. Electronically Signed   By: Marijo Sanes M.D.   On: 01/24/2018 17:35   Dg Abd Portable 1v  Result Date: 02/03/2018 CLINICAL DATA:  Nausea and vomiting EXAM: PORTABLE ABDOMEN - 1 VIEW COMPARISON:  None. FINDINGS: The bowel gas pattern is normal. No radio-opaque calculi or other significant radiographic abnormality are seen. IMPRESSION: Negative. Electronically Signed   By: Nelson Chimes M.D.   On: 02/03/2018 12:51   US Abdomen Limited Ruq  Result Date: 02/02/2018 CLINICAL DATA:  Transaminitis EXAM: ULTRASOUND ABDOMEN LIMITED RIGHT UPPER QUADRANT COMPARISON:  None. FINDINGS: Gallbladder: No gallstones or wall thickening visualized. No sonographic Murphy sign noted by sonographer. Common bile duct: Diameter: 5.9 mm Liver: Limited evaluation due to body habitus. No focal abnormalities noted. Portal vein is patent on color Doppler imaging with normal direction of blood flow towards the liver. IMPRESSION: Study limited by  patient body habitus. No cause for transaminitis identified. Electronically Signed   By: Dorise Bullion III  M.D   On: 02/02/2018 15:35     LOS: 5 days   Berle Mull, MD  [facility]  If 7PM-7AM, please contact night-coverage  Please page via [email or web address]-click on MD name and type text message  02/06/2018, 5:17 PM

## 2018-02-07 DIAGNOSIS — L89322 Pressure ulcer of left buttock, stage 2: Secondary | ICD-10-CM | POA: Diagnosis not present

## 2018-02-07 DIAGNOSIS — I110 Hypertensive heart disease with heart failure: Secondary | ICD-10-CM | POA: Diagnosis not present

## 2018-02-07 DIAGNOSIS — R600 Localized edema: Secondary | ICD-10-CM | POA: Diagnosis not present

## 2018-02-07 DIAGNOSIS — E1159 Type 2 diabetes mellitus with other circulatory complications: Secondary | ICD-10-CM | POA: Diagnosis not present

## 2018-02-07 DIAGNOSIS — N401 Enlarged prostate with lower urinary tract symptoms: Secondary | ICD-10-CM | POA: Diagnosis not present

## 2018-02-07 DIAGNOSIS — R0603 Acute respiratory distress: Secondary | ICD-10-CM | POA: Diagnosis not present

## 2018-02-07 DIAGNOSIS — I5033 Acute on chronic diastolic (congestive) heart failure: Secondary | ICD-10-CM | POA: Diagnosis not present

## 2018-02-07 DIAGNOSIS — E119 Type 2 diabetes mellitus without complications: Secondary | ICD-10-CM | POA: Diagnosis not present

## 2018-02-07 DIAGNOSIS — R1319 Other dysphagia: Secondary | ICD-10-CM | POA: Diagnosis not present

## 2018-02-07 DIAGNOSIS — R05 Cough: Secondary | ICD-10-CM | POA: Diagnosis not present

## 2018-02-07 DIAGNOSIS — R740 Nonspecific elevation of levels of transaminase and lactic acid dehydrogenase [LDH]: Secondary | ICD-10-CM | POA: Diagnosis not present

## 2018-02-07 DIAGNOSIS — J9601 Acute respiratory failure with hypoxia: Secondary | ICD-10-CM | POA: Diagnosis not present

## 2018-02-07 DIAGNOSIS — Z7401 Bed confinement status: Secondary | ICD-10-CM | POA: Diagnosis not present

## 2018-02-07 DIAGNOSIS — I679 Cerebrovascular disease, unspecified: Secondary | ICD-10-CM | POA: Diagnosis not present

## 2018-02-07 DIAGNOSIS — M109 Gout, unspecified: Secondary | ICD-10-CM | POA: Diagnosis not present

## 2018-02-07 DIAGNOSIS — R609 Edema, unspecified: Secondary | ICD-10-CM | POA: Diagnosis not present

## 2018-02-07 DIAGNOSIS — R5381 Other malaise: Secondary | ICD-10-CM | POA: Diagnosis not present

## 2018-02-07 DIAGNOSIS — I639 Cerebral infarction, unspecified: Secondary | ICD-10-CM | POA: Diagnosis not present

## 2018-02-07 DIAGNOSIS — J189 Pneumonia, unspecified organism: Secondary | ICD-10-CM | POA: Diagnosis not present

## 2018-02-07 DIAGNOSIS — E1165 Type 2 diabetes mellitus with hyperglycemia: Secondary | ICD-10-CM | POA: Diagnosis not present

## 2018-02-07 DIAGNOSIS — E538 Deficiency of other specified B group vitamins: Secondary | ICD-10-CM | POA: Diagnosis not present

## 2018-02-07 DIAGNOSIS — I5030 Unspecified diastolic (congestive) heart failure: Secondary | ICD-10-CM | POA: Diagnosis not present

## 2018-02-07 DIAGNOSIS — R488 Other symbolic dysfunctions: Secondary | ICD-10-CM | POA: Diagnosis not present

## 2018-02-07 DIAGNOSIS — M2550 Pain in unspecified joint: Secondary | ICD-10-CM | POA: Diagnosis not present

## 2018-02-07 DIAGNOSIS — L8990 Pressure ulcer of unspecified site, unspecified stage: Secondary | ICD-10-CM | POA: Diagnosis not present

## 2018-02-07 DIAGNOSIS — L89620 Pressure ulcer of left heel, unstageable: Secondary | ICD-10-CM | POA: Diagnosis not present

## 2018-02-07 DIAGNOSIS — I480 Paroxysmal atrial fibrillation: Secondary | ICD-10-CM | POA: Diagnosis not present

## 2018-02-07 DIAGNOSIS — L89610 Pressure ulcer of right heel, unstageable: Secondary | ICD-10-CM | POA: Diagnosis not present

## 2018-02-07 DIAGNOSIS — R1310 Dysphagia, unspecified: Secondary | ICD-10-CM | POA: Diagnosis not present

## 2018-02-07 DIAGNOSIS — L89312 Pressure ulcer of right buttock, stage 2: Secondary | ICD-10-CM | POA: Diagnosis not present

## 2018-02-07 DIAGNOSIS — I4891 Unspecified atrial fibrillation: Secondary | ICD-10-CM | POA: Diagnosis not present

## 2018-02-07 DIAGNOSIS — J690 Pneumonitis due to inhalation of food and vomit: Secondary | ICD-10-CM | POA: Diagnosis not present

## 2018-02-07 DIAGNOSIS — L988 Other specified disorders of the skin and subcutaneous tissue: Secondary | ICD-10-CM | POA: Diagnosis not present

## 2018-02-07 DIAGNOSIS — R0989 Other specified symptoms and signs involving the circulatory and respiratory systems: Secondary | ICD-10-CM | POA: Diagnosis not present

## 2018-02-07 DIAGNOSIS — R531 Weakness: Secondary | ICD-10-CM | POA: Diagnosis not present

## 2018-02-07 LAB — CBC
HCT: 42.4 % (ref 39.0–52.0)
Hemoglobin: 13.7 g/dL (ref 13.0–17.0)
MCH: 29.3 pg (ref 26.0–34.0)
MCHC: 32.3 g/dL (ref 30.0–36.0)
MCV: 90.8 fL (ref 80.0–100.0)
Platelets: 326 10*3/uL (ref 150–400)
RBC: 4.67 MIL/uL (ref 4.22–5.81)
RDW: 13.4 % (ref 11.5–15.5)
WBC: 11.8 10*3/uL — ABNORMAL HIGH (ref 4.0–10.5)
nRBC: 0 % (ref 0.0–0.2)

## 2018-02-07 LAB — GLUCOSE, CAPILLARY
GLUCOSE-CAPILLARY: 228 mg/dL — AB (ref 70–99)
GLUCOSE-CAPILLARY: 336 mg/dL — AB (ref 70–99)

## 2018-02-07 MED ORDER — B COMPLEX-C PO TABS: 1.0000 | ORAL_TABLET | Freq: Every day | ORAL | 0 refills | Status: AC

## 2018-02-07 MED ORDER — CEFDINIR 300 MG PO CAPS: 300.0000 mg | ORAL_CAPSULE | Freq: Two times a day (BID) | ORAL | 0 refills | Status: AC

## 2018-02-07 MED ORDER — ONDANSETRON HCL 4 MG PO TABS: 4.0000 mg | ORAL_TABLET | Freq: Three times a day (TID) | ORAL | 0 refills | Status: AC | PRN

## 2018-02-07 MED ORDER — FOLIC ACID 1 MG PO TABS: 1.0000 mg | ORAL_TABLET | Freq: Every day | ORAL | Status: DC

## 2018-02-07 MED ORDER — METOPROLOL TARTRATE 25 MG PO TABS: 12.5000 mg | ORAL_TABLET | Freq: Two times a day (BID) | ORAL | 0 refills | Status: AC

## 2018-02-07 MED ORDER — VITAMIN B-12 1000 MCG PO TABS: 1000.0000 ug | ORAL_TABLET | Freq: Every day | ORAL | 0 refills | Status: AC

## 2018-02-07 MED ORDER — COLLAGENASE 250 UNIT/GM EX OINT: TOPICAL_OINTMENT | Freq: Every day | CUTANEOUS | 0 refills | Status: AC

## 2018-02-07 MED ORDER — FUROSEMIDE 40 MG PO TABS: 40.0000 mg | ORAL_TABLET | Freq: Every day | ORAL | 0 refills | Status: AC

## 2018-02-07 MED ORDER — BOOST PO LIQD: 237.0000 mL | Freq: Three times a day (TID) | ORAL | 0 refills | Status: AC

## 2018-02-07 MED ORDER — GUAIFENESIN ER 600 MG PO TB12: 600.0000 mg | ORAL_TABLET | Freq: Two times a day (BID) | ORAL | 0 refills | Status: AC

## 2018-02-07 MED ORDER — MEGESTROL ACETATE 400 MG/10ML PO SUSP: 400.0000 mg | Freq: Every day | ORAL | 0 refills | Status: AC

## 2018-02-07 MED ORDER — INSULIN GLARGINE 100 UNIT/ML ~~LOC~~ SOLN: 28.0000 [IU] | Freq: Every day | SUBCUTANEOUS | 0 refills | Status: AC

## 2018-02-07 MED ORDER — ZINC OXIDE 40 % EX OINT: TOPICAL_OINTMENT | Freq: Four times a day (QID) | CUTANEOUS | 0 refills | Status: AC

## 2018-02-07 MED ORDER — SENNOSIDES-DOCUSATE SODIUM 8.6-50 MG PO TABS: 1.0000 | ORAL_TABLET | Freq: Two times a day (BID) | ORAL | 0 refills | Status: AC

## 2018-02-07 NOTE — Discharge Summary (Signed)
[facility] Discharge Summary   Patient: Charles Friedman ZYS:[id number]   PCP: Haywood Pao, MD DOB: 1932/05/02   Date of admission: 01/31/2018   Date of discharge:  02/07/2018    Discharge Diagnoses:  Principal diagnosis Acute hypoxic respiratory failure secondary to aspiration pneumonia. Active Problems:   Respiratory distress Chronic diastolic CHF HPI Vitamin X32 deficiency Physical deconditioning Lower extremity ulcers Transaminitis Paroxysmal A. 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  Admitted From: Home Disposition: SNF  Recommendations for Outpatient Follow-up:  1. Please follow-up with PCP in 1 week. 2. Scheduled to go for a vascular ultrasound on February 15, 2018. 3. Repeat vitamin B12 levels in 1 month. 4. Repeat LFT in 2 weeks 5. May require further work-up for failure to thrive if does not improve after therapy for 2-3 weeks.  Follow-up Information    Tisovec, Fransico Him, MD. Schedule an appointment as soon as possible for a visit in 1 week(s).   Specialty:  Internal Medicine Contact information: [address] [place] [address] [phone]          Diet recommendation: Dysphagia 3 diet, thin liquids  Activity: The patient is advised to gradually reintroduce usual activities.  Discharge Condition: good  Code Status: DNR/DNI  History of present illness: As per the H and P dictated on admission, "This is an 84 year old man with medical problems including atrial fibrillation on anticoagulation, chronic diastolic heart failure, history of CVA by chart review, insulin-dependent diabetes complicated by peripheral neuropathy presenting with shortness of breath.  History is obtained from chart review, discussion with the emergency medicine team, review of paper charts from the skilled nursing facility, as well as the patient's son.  Of note, the patient is able to  provide a limited history, but this is limited to single word answers.  Patient was recently made from December 8 to January 28, 2018 with acute decompensated heart failure.  He was discharged to a skilled nursing facility for short-term rehab.  Prior to this most recent admission he had been experiencing a functional decline over the past month.  Over the past month he has been needing help with his ADLs and is becoming increasingly weak.  His son reports over the past year he has had caregivers come and help manage his medications as well as other IADLs.  He walks with a 2 wheeled walker, struggles with balance due to his peripheral neuropathy.  After his most recent admission, he was discharged on 40 mg of furosemide twice daily.  Skilled nursing facility review does not appear that he has been receiving metolazone.  Son reports the patient has been constipated with minimal success with escalated bowel regimen.  Also some nausea and vomiting.  The patient reports having some upper airway congestion along with some increased dyspnea.  Other details include he retired from the [employer] in 1993, greatly values time with his grandchildren, does not drink alcohol or smoke.  Widowed.  At this clinician facility was reported that his O2 sats dropped less than 90% on room air, therefore he is placed on supplemental oxygen and taken to the emergency department.  ED Course: In the emergency department vital signs remarkable for tachycardia ranging from 93-1 30, respiratory rate maximum of 30 breaths/min, SBP nadir of 102, diagnostics remarkable for white count 12.1, hemoglobin 14.4.  BMP unremarkable with exception of mild hyperglycemia 176.  Lactic acid normal.  BNP of 144 which is down from  his baseline.  EKG sinus tachycardia.  Chest x-ray when compared to prior was improved from a heart failure standpoint.  Of note, prior chest x-ray was performed within the past week.  Due to concern for pulmonary  embolus, CT PE study obtained remarkable for no PE, did reveal bilateral lower lobe atelectasis, cardiomegaly and coronary artery disease, as well as Tree-in-bud nodular densities inthe superior segment of the right lower lobe could reflect small airways disease/alveolitis..  Emergency medicine team reportedly spoke with critical care team where overall benefits of empiric steroids thought to outweigh risks, given IV 40 mg of methylprednisolone.  Hospital medicine consulted for further management.  VBG obtained pH 7.435, CO2 on the venous gas 53. "  Hospital Course:  Summary of his active problems in the hospital is as following. Acute hypoxic respiratory failure Aspiration pneumonia. Although no obvious infiltrate seen on imaging studies-given patient was accumulating secretions/gurgling on 12/16-and sounded very congested-high suspicion for microaspiration.  Has improved following pulmonary toileting, bronchodilators and empiric Zosyn.  Patient was on IV Unasyn.  Will transition to oral cefdinir. Appreciate speech therapy evaluation.  Continue attempts with flutter valve/incentive spirometry etc.  Titrate off oxygen as tolerated.  CT chest was negative for pulmonary embolism.   Chronic diastolic heart failure: He does not appear to be volume overloaded-however given hypoxia/respiratory failure-he was given IV Lasix empirically-but since he has developed mild AK diuresis was on hold. Uses 40 mg of Lasix at home.  Will resume it  AKI:  Likely secondary to poor p.o. intake as well as diuresis with IV Lasix  Transaminitis: Right upper quadrant ultrasound negative. Statin is on hold. Discontinue Unasyn since that can also cause elevation of LFT.  Paroxysmal atrial fibrillation:  Rate controlled with metoprolol-continue Xarelto.    Type 2 diabetes mellitus, uncontrolled with hyperglycemia with renal complication CBG stable-continue Lantus and sliding scale insulin  BPH: Continue  Flomax.  Failure to thrive syndrome/acute on chronic debility Poor p.o. intake Severe B12 deficiency Per H&P-patient has been gradually declining over the past few months -worsening debility likely secondary to acute illness.  CT head unremarkable for any acute abnormality, ammonia normal, TSH and free T4 normal,  B12 is 131 start multivitamins. After addition of Megace patient's diet has improved. -PT OT recommends SNF.  Chronic left foot wound/right heel wound-unstageable:: Present prior to admission-appreciate wound care evaluation. Continue wound care at the SNF. Scheduled for vascular ABIs on February 15, 2018  Intractable nausea and vomiting. X-ray abdomen shows no significant stool burden. Patient did have a bowel movement as well. He was on scheduled Zofran with improvement in nausea Continue PRN Zofran on discharge  All other chronic medical condition were stable during the hospitalization.  Patient was seen by physical therapy, who recommended SNF, which was arranged by Education officer, museum and case Freight forwarder. On the day of the discharge the patient's Vitals are stable, and no other acute medical condition were reported by patient. the patient was felt safe to be discharge at SNF with therapy.  Consultants: None Procedures: None  DISCHARGE MEDICATION: Allergies as of 02/07/2018      Reactions   Aureomycin [chlortetracycline] Itching, Rash   Reaction approximately 1956      Medication List    STOP taking these medications   atorvastatin 40 MG tablet Commonly known as:  LIPITOR     TAKE these medications   B-complex with vitamin C tablet Take 1 tablet by mouth daily.   cefdinir 300 MG capsule Commonly known as:  OMNICEF Take 1 capsule (300 mg total) by mouth 2 (two) times daily for 1 day.   collagenase ointment Commonly known as:  SANTYL Apply topically daily.   escitalopram 5 MG tablet Commonly known as:  LEXAPRO Take 5 mg by mouth daily.   furosemide 40  MG tablet Commonly known as:  LASIX Take 1 tablet (40 mg total) by mouth daily. Start taking on:  February 08, 2018 What changed:    when to take this  additional instructions   guaiFENesin 600 MG 12 hr tablet Commonly known as:  MUCINEX Take 1 tablet (600 mg total) by mouth 2 (two) times daily.   insulin aspart 100 UNIT/ML injection Commonly known as:  novoLOG Inject 3 Units into the skin 3 (three) times daily with meals.   insulin glargine 100 UNIT/ML injection Commonly known as:  LANTUS Inject 0.28 mLs (28 Units total) into the skin at bedtime. What changed:  when to take this   lactose free nutrition Liqd Take 237 mLs by mouth 3 (three) times daily between meals.   liver oil-zinc oxide 40 % ointment Commonly known as:  DESITIN Apply topically 4 (four) times daily.   megestrol 400 MG/10ML suspension Commonly known as:  MEGACE Take 10 mLs (400 mg total) by mouth daily. Start taking on:  February 08, 2018   metoprolol tartrate 25 MG tablet Commonly known as:  LOPRESSOR Take 0.5 tablets (12.5 mg total) by mouth 2 (two) times daily.   nitroGLYCERIN 0.4 MG SL tablet Commonly known as:  NITROSTAT Place 1 tablet (0.4 mg total) under the tongue every 5 (five) minutes as needed for chest pain.   ondansetron 4 MG tablet Commonly known as:  ZOFRAN Take 1 tablet (4 mg total) by mouth every 8 (eight) hours as needed for nausea or vomiting.   pregabalin 75 MG capsule Commonly known as:  LYRICA Take 75 mg by mouth 2 (two) times daily.   rivaroxaban 20 MG Tabs tablet Commonly known as:  XARELTO Take 1 tablet (20 mg total) by mouth daily with supper.   senna-docusate 8.6-50 MG tablet Commonly known as:  Senokot-S Take 1 tablet by mouth 2 (two) times daily.   tamsulosin 0.4 MG Caps capsule Commonly known as:  FLOMAX Take 0.4 mg by mouth daily.   vitamin B-12 1000 MCG tablet Commonly known as:  CYANOCOBALAMIN Take 1 tablet (1,000 mcg total) by mouth daily.              Discharge Care Instructions  (From admission, onward)         Start     Ordered   02/07/18 0000  Discharge wound care:    Comments:  Apply betadine and allow to air dry to the following areas:  Left great toe tip, left heel, left plantar surface of the foot from toe 2 to toe 5.  Gently wrap in kerlex.   02/07/18 1123         Allergies  Allergen Reactions  . Aureomycin [Chlortetracycline] Itching and Rash    Reaction approximately 1954   Discharge Instructions    DIET DYS 3   Complete by:  As directed    Fluid consistency:  Thin   Discharge instructions   Complete by:  As directed    It is important that you read following instructions as well as go over your medication list with RN to help you understand your care after this hospitalization.  Discharge Instructions: Please follow-up with PCP in one week  Please  request your primary care physician to go over all Hospital Tests and Procedure/Radiological results at the follow up,  Please get all Hospital records sent to your PCP by signing hospital release before you go home.  Do not take more than prescribed Pain, Sleep and Anxiety Medications. You were cared for by a hospitalist during your hospital stay. If you have any questions about your discharge medications or the care you received while you were in the hospital after you are discharged, you can call the unit you were admitted to and ask to speak with the hospitalist on call if the hospitalist that took care of you is not available.  Once you are discharged, your primary care physician will handle any further medical issues. Please note that NO REFILLS for any discharge medications will be authorized once you are discharged, as it is imperative that you return to your primary care physician (or establish a relationship with a primary care physician if you do not have one) for your aftercare needs so that they can reassess your need for medications and monitor your lab  values. You Must read complete instructions/literature along with all the possible adverse reactions/side effects for all the Medicines you take and that have been prescribed to you. Take any new Medicines after you have completely understood and accept all the possible adverse reactions/side effects. If you have smoked or chewed Tobacco in the last 2 yrs please stop smoking and/or stop any Recreational drug use.   Discharge wound care:   Complete by:  As directed    Apply betadine and allow to air dry to the following areas:  Left great toe tip, left heel, left plantar surface of the foot from toe 2 to toe 5.  Gently wrap in kerlex.   Increase activity slowly   Complete by:  As directed    Prevalon boot   Complete by:  As directed      Discharge Exam: Filed Weights   02/04/18 0500 02/06/18 0300 02/07/18 0500  Weight: 104.3 kg 128.4 kg 110.2 kg   Vitals:   02/07/18 0758 02/07/18 0802  BP: 118/64   Pulse: 60 64  Resp: (!) 26   Temp:  98.1 F (36.7 C)  SpO2: 100% 98%   General: Appear in mild distress, no Rash; Oral Mucosa moist. Cardiovascular: S1 and S2 Present, no Murmur, no JVD Respiratory: Bilateral Air entry present and Clear to Auscultation, no Crackles, no wheezes Abdomen: Bowel Sound present, Soft and no tenderness Extremities: no Pedal edema, no calf tenderness, Chronic appearing bilateral heel and toe discoloration Neurology: Grossly no focal neuro deficit.  The results of significant diagnostics from this hospitalization (including imaging, microbiology, ancillary and laboratory) are listed below for reference.    Significant Diagnostic Studies: Ct Head Wo Contrast  Result Date: 01/24/2018 CLINICAL DATA:  Altered mental status. EXAM: CT HEAD WITHOUT CONTRAST TECHNIQUE: Contiguous axial images were obtained from the base of the skull through the vertex without intravenous contrast. COMPARISON:  10/20/2017 FINDINGS: Brain: There is no evidence for acute hemorrhage,  hydrocephalus, mass lesion, or abnormal extra-axial fluid collection. No definite CT evidence for acute infarction. Diffuse loss of parenchymal volume is consistent with atrophy. Patchy low attenuation in the deep hemispheric and periventricular white matter is nonspecific, but likely reflects chronic microvascular ischemic demyelination. Vascular: No hyperdense vessel or unexpected calcification. Skull: No evidence for fracture. No worrisome lytic or sclerotic lesion. Sinuses/Orbits: The visualized paranasal sinuses and mastoid air cells are clear. Visualized portions of the  globes and intraorbital fat are unremarkable. Other: None. IMPRESSION: 1. Stable.  No acute intracranial abnormality. 2. Atrophy with chronic small vessel white matter ischemic disease. Electronically Signed   By: Misty Stanley M.D.   On: 01/24/2018 17:51   Ct Angio Chest Pe W And/or Wo Contrast  Result Date: 01/31/2018 CLINICAL DATA:  Shortness of breath EXAM: CT ANGIOGRAPHY CHEST WITH CONTRAST TECHNIQUE: Multidetector CT imaging of the chest was performed using the standard protocol during bolus administration of intravenous contrast. Multiplanar CT image reconstructions and MIPs were obtained to evaluate the vascular anatomy. CONTRAST:  150mL ISOVUE-370 IOPAMIDOL (ISOVUE-370) INJECTION 76% COMPARISON:  None. FINDINGS: Cardiovascular: Heart is mildly enlarged. Aorta is normal caliber. Moderate coronary artery calcifications and aortic calcifications. No filling defects in the pulmonary arteries to suggest pulmonary emboli. Mediastinum/Nodes: No mediastinal, hilar, or axillary adenopathy. Lungs/Pleura: Predominantly linear densities in both lower lobes most compatible with atelectasis. Some tree-in-bud ground-glass nodular densities are noted in the superior segment of the right lower lobe which could reflect early alveolitis/small airways disease. No effusions. Upper Abdomen: Imaging into the upper abdomen shows no acute findings.  Musculoskeletal: Chest wall soft tissues are unremarkable. No acute bony abnormality. Review of the MIP images confirms the above findings. IMPRESSION: No evidence of pulmonary embolus. Cardiomegaly, coronary artery disease. Bilateral lower lobe atelectasis. Tree-in-bud nodular densities in the superior segment of the right lower lobe could reflect small airways disease/alveolitis. Aortic Atherosclerosis (ICD10-I70.0). Electronically Signed   By: Rolm Baptise M.D.   On: 01/31/2018 18:56   Dg Chest Port 1 View  Result Date: 02/01/2018 CLINICAL DATA:  84 year old male with a history of aspiration EXAM: PORTABLE CHEST 1 VIEW COMPARISON:  01/31/2018, 01/17/2018 FINDINGS: Cardiomediastinal silhouette unchanged in size and contour. Low lung volumes accentuate the interstitium. Mild interlobular septal thickening and fullness in the central vasculature. No large pleural effusion or pneumothorax. No confluent airspace disease. No acute displaced fracture IMPRESSION: Low lung volumes with interlobular/interstitial opacities, potentially representing mild pulmonary edema, atelectasis/scarring. Electronically Signed   By: Corrie Mckusick D.O.   On: 02/01/2018 09:04   Dg Chest Port 1 View  Result Date: 01/31/2018 CLINICAL DATA:  Shortness of breath EXAM: PORTABLE CHEST 1 VIEW COMPARISON:  01/27/2018 FINDINGS: There is improved aeration of the lungs. No pulmonary edema or focal consolidation. Unchanged mild cardiomegaly. No pleural effusion or pneumothorax. IMPRESSION: Improved aeration without focal airspace disease. Electronically Signed   By: Ulyses Jarred M.D.   On: 01/31/2018 17:38   Dg Chest Port 1 View  Result Date: 01/27/2018 CLINICAL DATA:  Shortness of breath on exertion EXAM: PORTABLE CHEST 1 VIEW COMPARISON:  01/24/2018 FINDINGS: Cardiomegaly with vascular congestion. Increasing interstitial prominence in the lungs could reflect interstitial edema. Bibasilar atelectasis. No visible effusions or acute  bony abnormality. IMPRESSION: Cardiomegaly with vascular congestion and interstitial prominence, likely interstitial edema. Bibasilar atelectasis. Electronically Signed   By: Rolm Baptise M.D.   On: 01/27/2018 09:44   Dg Chest Port 1 View  Result Date: 01/24/2018 CLINICAL DATA:  Shortness of breath and weakness. Altered mental status. EXAM: PORTABLE CHEST 1 VIEW COMPARISON:  10/21/2017 FINDINGS: The heart is borderline enlarged but stable. There is tortuosity and calcification of the thoracic aorta. Chronic bronchitic type lung changes but no infiltrates or effusions. IMPRESSION: Mild stable cardiac enlargement but no acute pulmonary findings. Electronically Signed   By: Marijo Sanes M.D.   On: 01/24/2018 17:35   Dg Abd Portable 1v  Result Date: 02/03/2018 CLINICAL DATA:  Nausea and vomiting  EXAM: PORTABLE ABDOMEN - 1 VIEW COMPARISON:  None. FINDINGS: The bowel gas pattern is normal. No radio-opaque calculi or other significant radiographic abnormality are seen. IMPRESSION: Negative. Electronically Signed   By: Nelson Chimes M.D.   On: 02/03/2018 12:51   US Abdomen Limited Ruq  Result Date: 02/02/2018 CLINICAL DATA:  Transaminitis EXAM: ULTRASOUND ABDOMEN LIMITED RIGHT UPPER QUADRANT COMPARISON:  None. FINDINGS: Gallbladder: No gallstones or wall thickening visualized. No sonographic Murphy sign noted by sonographer. Common bile duct: Diameter: 5.9 mm Liver: Limited evaluation due to body habitus. No focal abnormalities noted. Portal vein is patent on color Doppler imaging with normal direction of blood flow towards the liver. IMPRESSION: Study limited by patient body habitus. No cause for transaminitis identified. Electronically Signed   By: Dorise Bullion III M.D   On: 02/02/2018 15:35    Microbiology: Recent Results (from the past 240 hour(s))  Blood culture (routine x 2)     Status: None   Collection Time: 01/31/18  5:07 PM  Result Value Ref Range Status   Specimen Description BLOOD LEFT  ANTECUBITAL  Final   Special Requests   Final    BOTTLES DRAWN AEROBIC AND ANAEROBIC Blood Culture adequate volume   Culture   Final    NO GROWTH 5 DAYS Performed at [facility], [address] [place], [place] [address]    Report Status 02/05/2018 FINAL  Final  Blood culture (routine x 2)     Status: None   Collection Time: 01/31/18  5:12 PM  Result Value Ref Range Status   Specimen Description BLOOD SITE NOT SPECIFIED  Final   Special Requests   Final    BOTTLES DRAWN AEROBIC AND ANAEROBIC Blood Culture adequate volume   Culture   Final    NO GROWTH 5 DAYS Performed at [facility], [address] [place], [place] [address]    Report Status 02/05/2018 FINAL  Final  Respiratory Panel by PCR     Status: None   Collection Time: 02/01/18  1:13 AM  Result Value Ref Range Status   Adenovirus NOT DETECTED NOT DETECTED Final   Coronavirus 229E NOT DETECTED NOT DETECTED Final   Coronavirus HKU1 NOT DETECTED NOT DETECTED Final   Coronavirus NL63 NOT DETECTED NOT DETECTED Final   Coronavirus OC43 NOT DETECTED NOT DETECTED Final   Metapneumovirus NOT DETECTED NOT DETECTED Final   Rhinovirus / Enterovirus NOT DETECTED NOT DETECTED Final   Influenza A NOT DETECTED NOT DETECTED Final   Influenza B NOT DETECTED NOT DETECTED Final   Parainfluenza Virus 1 NOT DETECTED NOT DETECTED Final   Parainfluenza Virus 2 NOT DETECTED NOT DETECTED Final   Parainfluenza Virus 3 NOT DETECTED NOT DETECTED Final   Parainfluenza Virus 4 NOT DETECTED NOT DETECTED Final   Respiratory Syncytial Virus NOT DETECTED NOT DETECTED Final   Bordetella pertussis NOT DETECTED NOT DETECTED Final   Chlamydophila pneumoniae NOT DETECTED NOT DETECTED Final   Mycoplasma pneumoniae NOT DETECTED NOT DETECTED Final    Comment: Performed at [facility], [place] [address], [place],  [address]  MRSA PCR Screening     Status: Abnormal   Collection Time: 02/02/18 12:52 AM  Result Value Ref Range Status    MRSA by PCR POSITIVE (A) NEGATIVE Final    Comment:        The GeneXpert MRSA Assay (FDA approved for NASAL specimens only), is one component of a comprehensive MRSA colonization surveillance program. It is not intended  to diagnose MRSA infection nor to guide or monitor treatment for MRSA infections. RESULT CALLED TO, READ BACK BY AND VERIFIED WITH: Neita Carp RN 11:00 02/02/18 ([email or web address]) Performed at [facility], [place] [address] [place], [place] [address]      Labs: CBC: Recent Labs  Lab 01/31/18 1706  02/02/18 0229 02/03/18 0236 02/04/18 0239 02/06/18 0250 02/07/18 0233  WBC 12.1*   < > 11.9* 10.7* 10.3 11.8* 11.8*  NEUTROABS 9.7*  --   --   --  8.0*  --   --   HGB 14.4   < > 12.9* 13.6 13.1 12.6* 13.7  HCT 45.4   < > 39.8 41.1 42.0 39.1 42.4  MCV 91.0   < > 90.0 89.9 91.5 91.4 90.8  PLT 293   < > 273 288 267 298 326   < > = values in this interval not displayed.   Basic Metabolic Panel: Recent Labs  Lab 02/01/18 0245 02/02/18 0229 02/03/18 0236 02/04/18 0239 02/06/18 0250  NA 141 139 139 144 141  K 4.8 3.7 3.8 3.5 3.3*  CL 99 97* 99 103 100  CO2 29 31 29 29  32  GLUCOSE 236* 211* 206* 157* 215*  BUN 47* 54* 43* 38* 38*  CREATININE 1.14 1.35* 1.18 1.12 1.04  CALCIUM 8.8* 8.7* 8.6* 8.6* 8.7*  MG  --   --   --  2.2  --    Liver Function Tests: Recent Labs  Lab 01/31/18 1716 02/03/18 0236 02/04/18 0239 02/05/18 0756 02/06/18 0250  AST 120* 95* 102* 99* 93*  ALT 101* 88* 90* 93* 90*  ALKPHOS 144* 116 106 106 102  BILITOT 1.0 1.1 1.2 1.1 0.7  PROT 7.4 6.5 6.2* 6.3* 6.1*  ALBUMIN 2.8* 2.5* 2.4* 2.2* 2.1*   No results for input(s): LIPASE, AMYLASE in the last 168 hours. Recent Labs  Lab 01/31/18 1716  AMMONIA 15   Cardiac Enzymes: No results for input(s): CKTOTAL, CKMB, CKMBINDEX, TROPONINI in the last 168 hours. BNP (last 3 results) Recent Labs    01/24/18 1712 01/28/18 0415 01/31/18 1716  BNP 189.2* 180.4* 144.1*    CBG: Recent Labs  Lab 02/06/18 0751 02/06/18 1211 02/06/18 1629 02/06/18 2052 02/07/18 0759  GLUCAP 162* 246* 210* 225* 228*   Time spent: 35 minutes  Signed:  Berle Mull  [facility]  02/07/2018  , 11:23 AM

## 2018-02-07 NOTE — Progress Notes (Signed)
Patient will DC to: [facility], [place] Anticipated DC date: 02/07/2018 Family notified: Yes Transport by: Corey Harold   Per MD patient ready for DC to . RN, patient, patient's family, and facility notified of DC. Discharge Summary and FL2 sent to facility. RN to call report prior to discharge [phone]). DC packet on chart. Ambulance transport requested for patient.   CSW will sign off for now as social work intervention is no longer needed. Please consult [place] again if new needs arise.  Vallie Teters, LCSW-A [place]/Clinical Social Work Department Cell: [phone]

## 2018-02-07 NOTE — Clinical Social Work Placement (Signed)
Nurse to call and report to [phone] and will be going to room 712A.   CLINICAL SOCIAL WORK PLACEMENT  NOTE  Date:  02/07/2018  Patient Details  Name: Charles Friedman MRN: [id number] Date of Birth: 1932/08/07  Clinical Social Work is seeking post-discharge placement for this patient at the [place] level of care (*CSW will initial, date and re-position this form in  chart as items are completed):  Yes   Patient/family provided with [facility] Work Department's list of facilities offering this level of care within the geographic area requested by the patient (or if unable, by the patient's family).  Yes   Patient/family informed of their freedom to choose among providers that offer the needed level of care, that participate in Medicare, Medicaid or managed care program needed by the patient, have an available bed and are willing to accept the patient.      Patient/family informed of [facility]'s ownership interest in [facility] and [facility], as well as of the fact that they are under no obligation to receive care at these facilities.  PASRR submitted to EDS on       PASRR number received on       Existing PASRR number confirmed on 02/02/18     FL2 transmitted to all facilities in geographic area requested by pt/family on 02/02/18     FL2 transmitted to all facilities within larger geographic area on       Patient informed that his/her managed care company has contracts with or will negotiate with certain facilities, including the following:        Yes   Patient/family informed of bed offers received.  Patient chooses bed at [place], [place]     Physician recommends and patient chooses bed at      Patient to be transferred to [facility] on 02/05/18.  Patient to be transferred to facility by [facility]     Patient family notified on 02/05/18 of transfer.  Name of family member notified:        PHYSICIAN        Additional Comment:    _______________________________________________ Gelene Mink, [place] 02/07/2018, 11:42 AM

## 2018-02-09 DIAGNOSIS — L89610 Pressure ulcer of right heel, unstageable: Secondary | ICD-10-CM | POA: Diagnosis not present

## 2018-02-09 DIAGNOSIS — L89620 Pressure ulcer of left heel, unstageable: Secondary | ICD-10-CM | POA: Diagnosis not present

## 2018-02-09 DIAGNOSIS — L8990 Pressure ulcer of unspecified site, unspecified stage: Secondary | ICD-10-CM | POA: Diagnosis not present

## 2018-02-09 DIAGNOSIS — L988 Other specified disorders of the skin and subcutaneous tissue: Secondary | ICD-10-CM | POA: Diagnosis not present

## 2018-02-12 ENCOUNTER — Telehealth (HOSPITAL_COMMUNITY): Payer: Self-pay | Admitting: Surgery

## 2018-02-12 NOTE — Telephone Encounter (Signed)
Attempted to contact patient to confirm appointments for 02/15/2018 [facility] 

## 2018-02-14 DIAGNOSIS — R609 Edema, unspecified: Secondary | ICD-10-CM | POA: Diagnosis not present

## 2018-02-14 DIAGNOSIS — R600 Localized edema: Secondary | ICD-10-CM | POA: Diagnosis not present

## 2018-02-15 ENCOUNTER — Encounter (HOSPITAL_COMMUNITY): Payer: Medicare Other

## 2018-02-15 ENCOUNTER — Encounter: Payer: Medicare Other | Admitting: Vascular Surgery

## 2018-02-16 ENCOUNTER — Encounter: Payer: Self-pay | Admitting: Vascular Surgery

## 2018-02-16 DIAGNOSIS — L89620 Pressure ulcer of left heel, unstageable: Secondary | ICD-10-CM | POA: Diagnosis not present

## 2018-02-16 DIAGNOSIS — L8990 Pressure ulcer of unspecified site, unspecified stage: Secondary | ICD-10-CM | POA: Diagnosis not present

## 2018-02-16 DIAGNOSIS — L89322 Pressure ulcer of left buttock, stage 2: Secondary | ICD-10-CM | POA: Diagnosis not present

## 2018-02-16 DIAGNOSIS — L89312 Pressure ulcer of right buttock, stage 2: Secondary | ICD-10-CM | POA: Diagnosis not present

## 2018-02-16 DIAGNOSIS — L89610 Pressure ulcer of right heel, unstageable: Secondary | ICD-10-CM | POA: Diagnosis not present

## 2018-02-18 ENCOUNTER — Other Ambulatory Visit: Payer: Self-pay | Admitting: *Deleted

## 2018-02-18 NOTE — Patient Outreach (Signed)
[facility] ([facility] Care Management  02/18/2018  Nylen N Robillard 03/06/1932 [id number]   Facility site visit to [facility] in [place].   Met with [facility] Team member who gave update on patient.   UM Team member provided that patient's discharge plan is to return home with private sitters and hospice.  Patient not appropriate for [facility] Care Management services at this time.   Attempted to see patient at the bedside but was actively engaging with therapist.  Patient has recently readmitted to SNF so will continue communication with [facility] team if patient's D/C plan changes will engage.  Janci Minor RN, BSN [facility]  Post Acute Care Coordinator ([phone]) Business Mobile ([phone]) Toll free office    

## 2018-02-20 DIAGNOSIS — M109 Gout, unspecified: Secondary | ICD-10-CM | POA: Diagnosis not present

## 2018-02-23 DIAGNOSIS — L8990 Pressure ulcer of unspecified site, unspecified stage: Secondary | ICD-10-CM | POA: Diagnosis not present

## 2018-02-23 DIAGNOSIS — L89610 Pressure ulcer of right heel, unstageable: Secondary | ICD-10-CM | POA: Diagnosis not present

## 2018-02-23 DIAGNOSIS — L89620 Pressure ulcer of left heel, unstageable: Secondary | ICD-10-CM | POA: Diagnosis not present

## 2018-02-23 DIAGNOSIS — L89322 Pressure ulcer of left buttock, stage 2: Secondary | ICD-10-CM | POA: Diagnosis not present

## 2018-02-23 DIAGNOSIS — L89312 Pressure ulcer of right buttock, stage 2: Secondary | ICD-10-CM | POA: Diagnosis not present

## 2018-06-18 DIAGNOSIS — L89629 Pressure ulcer of left heel, unspecified stage: Secondary | ICD-10-CM | POA: Diagnosis not present

## 2018-06-18 DIAGNOSIS — I4891 Unspecified atrial fibrillation: Secondary | ICD-10-CM | POA: Diagnosis not present

## 2018-06-18 DIAGNOSIS — I5030 Unspecified diastolic (congestive) heart failure: Secondary | ICD-10-CM | POA: Diagnosis not present

## 2018-06-18 DIAGNOSIS — N401 Enlarged prostate with lower urinary tract symptoms: Secondary | ICD-10-CM | POA: Diagnosis not present

## 2018-06-18 DIAGNOSIS — J690 Pneumonitis due to inhalation of food and vomit: Secondary | ICD-10-CM | POA: Diagnosis not present

## 2018-06-18 DIAGNOSIS — L89613 Pressure ulcer of right heel, stage 3: Secondary | ICD-10-CM | POA: Diagnosis not present

## 2018-06-18 DIAGNOSIS — M109 Gout, unspecified: Secondary | ICD-10-CM | POA: Diagnosis not present

## 2018-06-18 DIAGNOSIS — E1159 Type 2 diabetes mellitus with other circulatory complications: Secondary | ICD-10-CM | POA: Diagnosis not present

## 2018-06-18 DIAGNOSIS — I110 Hypertensive heart disease with heart failure: Secondary | ICD-10-CM | POA: Diagnosis not present

## 2018-06-18 DIAGNOSIS — I69391 Dysphagia following cerebral infarction: Secondary | ICD-10-CM | POA: Diagnosis not present

## 2018-06-20 DIAGNOSIS — I69391 Dysphagia following cerebral infarction: Secondary | ICD-10-CM | POA: Diagnosis not present

## 2018-06-20 DIAGNOSIS — J690 Pneumonitis due to inhalation of food and vomit: Secondary | ICD-10-CM | POA: Diagnosis not present

## 2018-06-20 DIAGNOSIS — I110 Hypertensive heart disease with heart failure: Secondary | ICD-10-CM | POA: Diagnosis not present

## 2018-06-20 DIAGNOSIS — I4891 Unspecified atrial fibrillation: Secondary | ICD-10-CM | POA: Diagnosis not present

## 2018-06-20 DIAGNOSIS — E1159 Type 2 diabetes mellitus with other circulatory complications: Secondary | ICD-10-CM | POA: Diagnosis not present

## 2018-06-20 DIAGNOSIS — I5030 Unspecified diastolic (congestive) heart failure: Secondary | ICD-10-CM | POA: Diagnosis not present

## 2018-06-28 DIAGNOSIS — E1159 Type 2 diabetes mellitus with other circulatory complications: Secondary | ICD-10-CM | POA: Diagnosis not present

## 2018-06-28 DIAGNOSIS — J690 Pneumonitis due to inhalation of food and vomit: Secondary | ICD-10-CM | POA: Diagnosis not present

## 2018-06-28 DIAGNOSIS — I69391 Dysphagia following cerebral infarction: Secondary | ICD-10-CM | POA: Diagnosis not present

## 2018-06-28 DIAGNOSIS — I110 Hypertensive heart disease with heart failure: Secondary | ICD-10-CM | POA: Diagnosis not present

## 2018-06-28 DIAGNOSIS — I4891 Unspecified atrial fibrillation: Secondary | ICD-10-CM | POA: Diagnosis not present

## 2018-06-28 DIAGNOSIS — I5030 Unspecified diastolic (congestive) heart failure: Secondary | ICD-10-CM | POA: Diagnosis not present

## 2018-07-06 DIAGNOSIS — I69391 Dysphagia following cerebral infarction: Secondary | ICD-10-CM | POA: Diagnosis not present

## 2018-07-06 DIAGNOSIS — I110 Hypertensive heart disease with heart failure: Secondary | ICD-10-CM | POA: Diagnosis not present

## 2018-07-06 DIAGNOSIS — E1159 Type 2 diabetes mellitus with other circulatory complications: Secondary | ICD-10-CM | POA: Diagnosis not present

## 2018-07-06 DIAGNOSIS — J690 Pneumonitis due to inhalation of food and vomit: Secondary | ICD-10-CM | POA: Diagnosis not present

## 2018-07-06 DIAGNOSIS — I5030 Unspecified diastolic (congestive) heart failure: Secondary | ICD-10-CM | POA: Diagnosis not present

## 2018-07-06 DIAGNOSIS — I4891 Unspecified atrial fibrillation: Secondary | ICD-10-CM | POA: Diagnosis not present

## 2018-07-14 DIAGNOSIS — I4891 Unspecified atrial fibrillation: Secondary | ICD-10-CM | POA: Diagnosis not present

## 2018-07-14 DIAGNOSIS — I69391 Dysphagia following cerebral infarction: Secondary | ICD-10-CM | POA: Diagnosis not present

## 2018-07-14 DIAGNOSIS — I110 Hypertensive heart disease with heart failure: Secondary | ICD-10-CM | POA: Diagnosis not present

## 2018-07-14 DIAGNOSIS — E1159 Type 2 diabetes mellitus with other circulatory complications: Secondary | ICD-10-CM | POA: Diagnosis not present

## 2018-07-14 DIAGNOSIS — I5030 Unspecified diastolic (congestive) heart failure: Secondary | ICD-10-CM | POA: Diagnosis not present

## 2018-07-14 DIAGNOSIS — J690 Pneumonitis due to inhalation of food and vomit: Secondary | ICD-10-CM | POA: Diagnosis not present

## 2018-07-16 DIAGNOSIS — I69391 Dysphagia following cerebral infarction: Secondary | ICD-10-CM | POA: Diagnosis not present

## 2018-07-16 DIAGNOSIS — J690 Pneumonitis due to inhalation of food and vomit: Secondary | ICD-10-CM | POA: Diagnosis not present

## 2018-07-16 DIAGNOSIS — I4891 Unspecified atrial fibrillation: Secondary | ICD-10-CM | POA: Diagnosis not present

## 2018-07-16 DIAGNOSIS — E1159 Type 2 diabetes mellitus with other circulatory complications: Secondary | ICD-10-CM | POA: Diagnosis not present

## 2018-07-16 DIAGNOSIS — I110 Hypertensive heart disease with heart failure: Secondary | ICD-10-CM | POA: Diagnosis not present

## 2018-07-16 DIAGNOSIS — I5030 Unspecified diastolic (congestive) heart failure: Secondary | ICD-10-CM | POA: Diagnosis not present

## 2018-07-19 DIAGNOSIS — N401 Enlarged prostate with lower urinary tract symptoms: Secondary | ICD-10-CM | POA: Diagnosis not present

## 2018-07-19 DIAGNOSIS — I5030 Unspecified diastolic (congestive) heart failure: Secondary | ICD-10-CM | POA: Diagnosis not present

## 2018-07-19 DIAGNOSIS — L89613 Pressure ulcer of right heel, stage 3: Secondary | ICD-10-CM | POA: Diagnosis not present

## 2018-07-19 DIAGNOSIS — I4891 Unspecified atrial fibrillation: Secondary | ICD-10-CM | POA: Diagnosis not present

## 2018-07-19 DIAGNOSIS — L89629 Pressure ulcer of left heel, unspecified stage: Secondary | ICD-10-CM | POA: Diagnosis not present

## 2018-07-19 DIAGNOSIS — J690 Pneumonitis due to inhalation of food and vomit: Secondary | ICD-10-CM | POA: Diagnosis not present

## 2018-07-19 DIAGNOSIS — I69391 Dysphagia following cerebral infarction: Secondary | ICD-10-CM | POA: Diagnosis not present

## 2018-07-19 DIAGNOSIS — E1159 Type 2 diabetes mellitus with other circulatory complications: Secondary | ICD-10-CM | POA: Diagnosis not present

## 2018-07-19 DIAGNOSIS — I110 Hypertensive heart disease with heart failure: Secondary | ICD-10-CM | POA: Diagnosis not present

## 2018-07-19 DIAGNOSIS — M109 Gout, unspecified: Secondary | ICD-10-CM | POA: Diagnosis not present

## 2018-07-23 DIAGNOSIS — E1159 Type 2 diabetes mellitus with other circulatory complications: Secondary | ICD-10-CM | POA: Diagnosis not present

## 2018-07-23 DIAGNOSIS — J690 Pneumonitis due to inhalation of food and vomit: Secondary | ICD-10-CM | POA: Diagnosis not present

## 2018-07-23 DIAGNOSIS — I4891 Unspecified atrial fibrillation: Secondary | ICD-10-CM | POA: Diagnosis not present

## 2018-07-23 DIAGNOSIS — I5030 Unspecified diastolic (congestive) heart failure: Secondary | ICD-10-CM | POA: Diagnosis not present

## 2018-07-23 DIAGNOSIS — I69391 Dysphagia following cerebral infarction: Secondary | ICD-10-CM | POA: Diagnosis not present

## 2018-07-23 DIAGNOSIS — I110 Hypertensive heart disease with heart failure: Secondary | ICD-10-CM | POA: Diagnosis not present

## 2018-07-26 DIAGNOSIS — I5030 Unspecified diastolic (congestive) heart failure: Secondary | ICD-10-CM | POA: Diagnosis not present

## 2018-07-26 DIAGNOSIS — I110 Hypertensive heart disease with heart failure: Secondary | ICD-10-CM | POA: Diagnosis not present

## 2018-07-26 DIAGNOSIS — E1159 Type 2 diabetes mellitus with other circulatory complications: Secondary | ICD-10-CM | POA: Diagnosis not present

## 2018-07-26 DIAGNOSIS — J690 Pneumonitis due to inhalation of food and vomit: Secondary | ICD-10-CM | POA: Diagnosis not present

## 2018-07-26 DIAGNOSIS — I4891 Unspecified atrial fibrillation: Secondary | ICD-10-CM | POA: Diagnosis not present

## 2018-07-26 DIAGNOSIS — I69391 Dysphagia following cerebral infarction: Secondary | ICD-10-CM | POA: Diagnosis not present

## 2018-08-02 DIAGNOSIS — I69391 Dysphagia following cerebral infarction: Secondary | ICD-10-CM | POA: Diagnosis not present

## 2018-08-02 DIAGNOSIS — E1159 Type 2 diabetes mellitus with other circulatory complications: Secondary | ICD-10-CM | POA: Diagnosis not present

## 2018-08-02 DIAGNOSIS — I5030 Unspecified diastolic (congestive) heart failure: Secondary | ICD-10-CM | POA: Diagnosis not present

## 2018-08-02 DIAGNOSIS — I110 Hypertensive heart disease with heart failure: Secondary | ICD-10-CM | POA: Diagnosis not present

## 2018-08-02 DIAGNOSIS — I4891 Unspecified atrial fibrillation: Secondary | ICD-10-CM | POA: Diagnosis not present

## 2018-08-02 DIAGNOSIS — J690 Pneumonitis due to inhalation of food and vomit: Secondary | ICD-10-CM | POA: Diagnosis not present

## 2018-08-09 DIAGNOSIS — I5030 Unspecified diastolic (congestive) heart failure: Secondary | ICD-10-CM | POA: Diagnosis not present

## 2018-08-09 DIAGNOSIS — J690 Pneumonitis due to inhalation of food and vomit: Secondary | ICD-10-CM | POA: Diagnosis not present

## 2018-08-09 DIAGNOSIS — I69391 Dysphagia following cerebral infarction: Secondary | ICD-10-CM | POA: Diagnosis not present

## 2018-08-09 DIAGNOSIS — I4891 Unspecified atrial fibrillation: Secondary | ICD-10-CM | POA: Diagnosis not present

## 2018-08-09 DIAGNOSIS — I110 Hypertensive heart disease with heart failure: Secondary | ICD-10-CM | POA: Diagnosis not present

## 2018-08-09 DIAGNOSIS — E1159 Type 2 diabetes mellitus with other circulatory complications: Secondary | ICD-10-CM | POA: Diagnosis not present

## 2018-08-13 DIAGNOSIS — I69391 Dysphagia following cerebral infarction: Secondary | ICD-10-CM | POA: Diagnosis not present

## 2018-08-13 DIAGNOSIS — E1159 Type 2 diabetes mellitus with other circulatory complications: Secondary | ICD-10-CM | POA: Diagnosis not present

## 2018-08-13 DIAGNOSIS — I5030 Unspecified diastolic (congestive) heart failure: Secondary | ICD-10-CM | POA: Diagnosis not present

## 2018-08-13 DIAGNOSIS — I4891 Unspecified atrial fibrillation: Secondary | ICD-10-CM | POA: Diagnosis not present

## 2018-08-13 DIAGNOSIS — J690 Pneumonitis due to inhalation of food and vomit: Secondary | ICD-10-CM | POA: Diagnosis not present

## 2018-08-13 DIAGNOSIS — I110 Hypertensive heart disease with heart failure: Secondary | ICD-10-CM | POA: Diagnosis not present

## 2018-08-16 DIAGNOSIS — E1159 Type 2 diabetes mellitus with other circulatory complications: Secondary | ICD-10-CM | POA: Diagnosis not present

## 2018-08-16 DIAGNOSIS — I5030 Unspecified diastolic (congestive) heart failure: Secondary | ICD-10-CM | POA: Diagnosis not present

## 2018-08-16 DIAGNOSIS — I69391 Dysphagia following cerebral infarction: Secondary | ICD-10-CM | POA: Diagnosis not present

## 2018-08-16 DIAGNOSIS — J690 Pneumonitis due to inhalation of food and vomit: Secondary | ICD-10-CM | POA: Diagnosis not present

## 2018-08-16 DIAGNOSIS — I4891 Unspecified atrial fibrillation: Secondary | ICD-10-CM | POA: Diagnosis not present

## 2018-08-16 DIAGNOSIS — I110 Hypertensive heart disease with heart failure: Secondary | ICD-10-CM | POA: Diagnosis not present

## 2018-08-18 DIAGNOSIS — L89613 Pressure ulcer of right heel, stage 3: Secondary | ICD-10-CM | POA: Diagnosis not present

## 2018-08-18 DIAGNOSIS — E1159 Type 2 diabetes mellitus with other circulatory complications: Secondary | ICD-10-CM | POA: Diagnosis not present

## 2018-08-18 DIAGNOSIS — I4891 Unspecified atrial fibrillation: Secondary | ICD-10-CM | POA: Diagnosis not present

## 2018-08-18 DIAGNOSIS — I69391 Dysphagia following cerebral infarction: Secondary | ICD-10-CM | POA: Diagnosis not present

## 2018-08-18 DIAGNOSIS — M109 Gout, unspecified: Secondary | ICD-10-CM | POA: Diagnosis not present

## 2018-08-18 DIAGNOSIS — L89629 Pressure ulcer of left heel, unspecified stage: Secondary | ICD-10-CM | POA: Diagnosis not present

## 2018-08-18 DIAGNOSIS — N401 Enlarged prostate with lower urinary tract symptoms: Secondary | ICD-10-CM | POA: Diagnosis not present

## 2018-08-18 DIAGNOSIS — I110 Hypertensive heart disease with heart failure: Secondary | ICD-10-CM | POA: Diagnosis not present

## 2018-08-18 DIAGNOSIS — I5030 Unspecified diastolic (congestive) heart failure: Secondary | ICD-10-CM | POA: Diagnosis not present

## 2018-08-18 DIAGNOSIS — J690 Pneumonitis due to inhalation of food and vomit: Secondary | ICD-10-CM | POA: Diagnosis not present

## 2018-08-23 DIAGNOSIS — I5030 Unspecified diastolic (congestive) heart failure: Secondary | ICD-10-CM | POA: Diagnosis not present

## 2018-08-23 DIAGNOSIS — I110 Hypertensive heart disease with heart failure: Secondary | ICD-10-CM | POA: Diagnosis not present

## 2018-08-23 DIAGNOSIS — J690 Pneumonitis due to inhalation of food and vomit: Secondary | ICD-10-CM | POA: Diagnosis not present

## 2018-08-23 DIAGNOSIS — E1159 Type 2 diabetes mellitus with other circulatory complications: Secondary | ICD-10-CM | POA: Diagnosis not present

## 2018-08-23 DIAGNOSIS — I4891 Unspecified atrial fibrillation: Secondary | ICD-10-CM | POA: Diagnosis not present

## 2018-08-23 DIAGNOSIS — I69391 Dysphagia following cerebral infarction: Secondary | ICD-10-CM | POA: Diagnosis not present

## 2018-08-30 DIAGNOSIS — J690 Pneumonitis due to inhalation of food and vomit: Secondary | ICD-10-CM | POA: Diagnosis not present

## 2018-08-30 DIAGNOSIS — I5030 Unspecified diastolic (congestive) heart failure: Secondary | ICD-10-CM | POA: Diagnosis not present

## 2018-08-30 DIAGNOSIS — E1159 Type 2 diabetes mellitus with other circulatory complications: Secondary | ICD-10-CM | POA: Diagnosis not present

## 2018-08-30 DIAGNOSIS — I69391 Dysphagia following cerebral infarction: Secondary | ICD-10-CM | POA: Diagnosis not present

## 2018-08-30 DIAGNOSIS — I4891 Unspecified atrial fibrillation: Secondary | ICD-10-CM | POA: Diagnosis not present

## 2018-08-30 DIAGNOSIS — I110 Hypertensive heart disease with heart failure: Secondary | ICD-10-CM | POA: Diagnosis not present

## 2018-09-03 DIAGNOSIS — I110 Hypertensive heart disease with heart failure: Secondary | ICD-10-CM | POA: Diagnosis not present

## 2018-09-03 DIAGNOSIS — J690 Pneumonitis due to inhalation of food and vomit: Secondary | ICD-10-CM | POA: Diagnosis not present

## 2018-09-03 DIAGNOSIS — I4891 Unspecified atrial fibrillation: Secondary | ICD-10-CM | POA: Diagnosis not present

## 2018-09-03 DIAGNOSIS — I5030 Unspecified diastolic (congestive) heart failure: Secondary | ICD-10-CM | POA: Diagnosis not present

## 2018-09-03 DIAGNOSIS — E1159 Type 2 diabetes mellitus with other circulatory complications: Secondary | ICD-10-CM | POA: Diagnosis not present

## 2018-09-03 DIAGNOSIS — I69391 Dysphagia following cerebral infarction: Secondary | ICD-10-CM | POA: Diagnosis not present

## 2018-09-10 DIAGNOSIS — I4891 Unspecified atrial fibrillation: Secondary | ICD-10-CM | POA: Diagnosis not present

## 2018-09-10 DIAGNOSIS — E1159 Type 2 diabetes mellitus with other circulatory complications: Secondary | ICD-10-CM | POA: Diagnosis not present

## 2018-09-10 DIAGNOSIS — I5030 Unspecified diastolic (congestive) heart failure: Secondary | ICD-10-CM | POA: Diagnosis not present

## 2018-09-10 DIAGNOSIS — I110 Hypertensive heart disease with heart failure: Secondary | ICD-10-CM | POA: Diagnosis not present

## 2018-09-10 DIAGNOSIS — J690 Pneumonitis due to inhalation of food and vomit: Secondary | ICD-10-CM | POA: Diagnosis not present

## 2018-09-10 DIAGNOSIS — I69391 Dysphagia following cerebral infarction: Secondary | ICD-10-CM | POA: Diagnosis not present

## 2018-09-17 DIAGNOSIS — I5030 Unspecified diastolic (congestive) heart failure: Secondary | ICD-10-CM | POA: Diagnosis not present

## 2018-09-17 DIAGNOSIS — I4891 Unspecified atrial fibrillation: Secondary | ICD-10-CM | POA: Diagnosis not present

## 2018-09-17 DIAGNOSIS — I110 Hypertensive heart disease with heart failure: Secondary | ICD-10-CM | POA: Diagnosis not present

## 2018-09-17 DIAGNOSIS — E1159 Type 2 diabetes mellitus with other circulatory complications: Secondary | ICD-10-CM | POA: Diagnosis not present

## 2018-09-17 DIAGNOSIS — I69391 Dysphagia following cerebral infarction: Secondary | ICD-10-CM | POA: Diagnosis not present

## 2018-09-17 DIAGNOSIS — J690 Pneumonitis due to inhalation of food and vomit: Secondary | ICD-10-CM | POA: Diagnosis not present

## 2018-09-18 DIAGNOSIS — I110 Hypertensive heart disease with heart failure: Secondary | ICD-10-CM | POA: Diagnosis not present

## 2018-09-18 DIAGNOSIS — J690 Pneumonitis due to inhalation of food and vomit: Secondary | ICD-10-CM | POA: Diagnosis not present

## 2018-09-18 DIAGNOSIS — L89629 Pressure ulcer of left heel, unspecified stage: Secondary | ICD-10-CM | POA: Diagnosis not present

## 2018-09-18 DIAGNOSIS — I69391 Dysphagia following cerebral infarction: Secondary | ICD-10-CM | POA: Diagnosis not present

## 2018-09-18 DIAGNOSIS — L89610 Pressure ulcer of right heel, unstageable: Secondary | ICD-10-CM | POA: Diagnosis not present

## 2018-09-18 DIAGNOSIS — I70209 Unspecified atherosclerosis of native arteries of extremities, unspecified extremity: Secondary | ICD-10-CM | POA: Diagnosis not present

## 2018-09-18 DIAGNOSIS — E1151 Type 2 diabetes mellitus with diabetic peripheral angiopathy without gangrene: Secondary | ICD-10-CM | POA: Diagnosis not present

## 2018-09-18 DIAGNOSIS — I4891 Unspecified atrial fibrillation: Secondary | ICD-10-CM | POA: Diagnosis not present

## 2018-09-18 DIAGNOSIS — L89151 Pressure ulcer of sacral region, stage 1: Secondary | ICD-10-CM | POA: Diagnosis not present

## 2018-09-18 DIAGNOSIS — N401 Enlarged prostate with lower urinary tract symptoms: Secondary | ICD-10-CM | POA: Diagnosis not present

## 2018-09-18 DIAGNOSIS — M109 Gout, unspecified: Secondary | ICD-10-CM | POA: Diagnosis not present

## 2018-09-18 DIAGNOSIS — I5030 Unspecified diastolic (congestive) heart failure: Secondary | ICD-10-CM | POA: Diagnosis not present

## 2018-09-20 DIAGNOSIS — I110 Hypertensive heart disease with heart failure: Secondary | ICD-10-CM | POA: Diagnosis not present

## 2018-09-20 DIAGNOSIS — I4891 Unspecified atrial fibrillation: Secondary | ICD-10-CM | POA: Diagnosis not present

## 2018-09-20 DIAGNOSIS — I5030 Unspecified diastolic (congestive) heart failure: Secondary | ICD-10-CM | POA: Diagnosis not present

## 2018-09-20 DIAGNOSIS — E1151 Type 2 diabetes mellitus with diabetic peripheral angiopathy without gangrene: Secondary | ICD-10-CM | POA: Diagnosis not present

## 2018-09-20 DIAGNOSIS — I70209 Unspecified atherosclerosis of native arteries of extremities, unspecified extremity: Secondary | ICD-10-CM | POA: Diagnosis not present

## 2018-09-20 DIAGNOSIS — I69391 Dysphagia following cerebral infarction: Secondary | ICD-10-CM | POA: Diagnosis not present

## 2018-09-24 DIAGNOSIS — E1151 Type 2 diabetes mellitus with diabetic peripheral angiopathy without gangrene: Secondary | ICD-10-CM | POA: Diagnosis not present

## 2018-09-24 DIAGNOSIS — I69391 Dysphagia following cerebral infarction: Secondary | ICD-10-CM | POA: Diagnosis not present

## 2018-09-24 DIAGNOSIS — I110 Hypertensive heart disease with heart failure: Secondary | ICD-10-CM | POA: Diagnosis not present

## 2018-09-24 DIAGNOSIS — I5030 Unspecified diastolic (congestive) heart failure: Secondary | ICD-10-CM | POA: Diagnosis not present

## 2018-09-24 DIAGNOSIS — I70209 Unspecified atherosclerosis of native arteries of extremities, unspecified extremity: Secondary | ICD-10-CM | POA: Diagnosis not present

## 2018-09-24 DIAGNOSIS — I4891 Unspecified atrial fibrillation: Secondary | ICD-10-CM | POA: Diagnosis not present

## 2018-09-28 DIAGNOSIS — I4891 Unspecified atrial fibrillation: Secondary | ICD-10-CM | POA: Diagnosis not present

## 2018-09-28 DIAGNOSIS — I70209 Unspecified atherosclerosis of native arteries of extremities, unspecified extremity: Secondary | ICD-10-CM | POA: Diagnosis not present

## 2018-09-28 DIAGNOSIS — I69391 Dysphagia following cerebral infarction: Secondary | ICD-10-CM | POA: Diagnosis not present

## 2018-09-28 DIAGNOSIS — E1151 Type 2 diabetes mellitus with diabetic peripheral angiopathy without gangrene: Secondary | ICD-10-CM | POA: Diagnosis not present

## 2018-09-28 DIAGNOSIS — I5030 Unspecified diastolic (congestive) heart failure: Secondary | ICD-10-CM | POA: Diagnosis not present

## 2018-09-28 DIAGNOSIS — I110 Hypertensive heart disease with heart failure: Secondary | ICD-10-CM | POA: Diagnosis not present

## 2018-10-01 DIAGNOSIS — I5030 Unspecified diastolic (congestive) heart failure: Secondary | ICD-10-CM | POA: Diagnosis not present

## 2018-10-01 DIAGNOSIS — I70209 Unspecified atherosclerosis of native arteries of extremities, unspecified extremity: Secondary | ICD-10-CM | POA: Diagnosis not present

## 2018-10-01 DIAGNOSIS — E1151 Type 2 diabetes mellitus with diabetic peripheral angiopathy without gangrene: Secondary | ICD-10-CM | POA: Diagnosis not present

## 2018-10-01 DIAGNOSIS — I69391 Dysphagia following cerebral infarction: Secondary | ICD-10-CM | POA: Diagnosis not present

## 2018-10-01 DIAGNOSIS — I110 Hypertensive heart disease with heart failure: Secondary | ICD-10-CM | POA: Diagnosis not present

## 2018-10-01 DIAGNOSIS — I4891 Unspecified atrial fibrillation: Secondary | ICD-10-CM | POA: Diagnosis not present

## 2018-10-04 DIAGNOSIS — I4891 Unspecified atrial fibrillation: Secondary | ICD-10-CM | POA: Diagnosis not present

## 2018-10-04 DIAGNOSIS — I69391 Dysphagia following cerebral infarction: Secondary | ICD-10-CM | POA: Diagnosis not present

## 2018-10-04 DIAGNOSIS — E1151 Type 2 diabetes mellitus with diabetic peripheral angiopathy without gangrene: Secondary | ICD-10-CM | POA: Diagnosis not present

## 2018-10-04 DIAGNOSIS — I5030 Unspecified diastolic (congestive) heart failure: Secondary | ICD-10-CM | POA: Diagnosis not present

## 2018-10-04 DIAGNOSIS — I70209 Unspecified atherosclerosis of native arteries of extremities, unspecified extremity: Secondary | ICD-10-CM | POA: Diagnosis not present

## 2018-10-04 DIAGNOSIS — I110 Hypertensive heart disease with heart failure: Secondary | ICD-10-CM | POA: Diagnosis not present

## 2018-10-08 DIAGNOSIS — E1151 Type 2 diabetes mellitus with diabetic peripheral angiopathy without gangrene: Secondary | ICD-10-CM | POA: Diagnosis not present

## 2018-10-08 DIAGNOSIS — I110 Hypertensive heart disease with heart failure: Secondary | ICD-10-CM | POA: Diagnosis not present

## 2018-10-08 DIAGNOSIS — I69391 Dysphagia following cerebral infarction: Secondary | ICD-10-CM | POA: Diagnosis not present

## 2018-10-08 DIAGNOSIS — I4891 Unspecified atrial fibrillation: Secondary | ICD-10-CM | POA: Diagnosis not present

## 2018-10-08 DIAGNOSIS — I5030 Unspecified diastolic (congestive) heart failure: Secondary | ICD-10-CM | POA: Diagnosis not present

## 2018-10-08 DIAGNOSIS — I70209 Unspecified atherosclerosis of native arteries of extremities, unspecified extremity: Secondary | ICD-10-CM | POA: Diagnosis not present

## 2018-10-12 DIAGNOSIS — I5030 Unspecified diastolic (congestive) heart failure: Secondary | ICD-10-CM | POA: Diagnosis not present

## 2018-10-12 DIAGNOSIS — I4891 Unspecified atrial fibrillation: Secondary | ICD-10-CM | POA: Diagnosis not present

## 2018-10-12 DIAGNOSIS — I110 Hypertensive heart disease with heart failure: Secondary | ICD-10-CM | POA: Diagnosis not present

## 2018-10-12 DIAGNOSIS — I69391 Dysphagia following cerebral infarction: Secondary | ICD-10-CM | POA: Diagnosis not present

## 2018-10-12 DIAGNOSIS — I70209 Unspecified atherosclerosis of native arteries of extremities, unspecified extremity: Secondary | ICD-10-CM | POA: Diagnosis not present

## 2018-10-12 DIAGNOSIS — E1151 Type 2 diabetes mellitus with diabetic peripheral angiopathy without gangrene: Secondary | ICD-10-CM | POA: Diagnosis not present

## 2018-10-15 DIAGNOSIS — I4891 Unspecified atrial fibrillation: Secondary | ICD-10-CM | POA: Diagnosis not present

## 2018-10-15 DIAGNOSIS — I5030 Unspecified diastolic (congestive) heart failure: Secondary | ICD-10-CM | POA: Diagnosis not present

## 2018-10-15 DIAGNOSIS — I110 Hypertensive heart disease with heart failure: Secondary | ICD-10-CM | POA: Diagnosis not present

## 2018-10-15 DIAGNOSIS — I70209 Unspecified atherosclerosis of native arteries of extremities, unspecified extremity: Secondary | ICD-10-CM | POA: Diagnosis not present

## 2018-10-15 DIAGNOSIS — E1151 Type 2 diabetes mellitus with diabetic peripheral angiopathy without gangrene: Secondary | ICD-10-CM | POA: Diagnosis not present

## 2018-10-15 DIAGNOSIS — I69391 Dysphagia following cerebral infarction: Secondary | ICD-10-CM | POA: Diagnosis not present

## 2018-10-18 DIAGNOSIS — I69391 Dysphagia following cerebral infarction: Secondary | ICD-10-CM | POA: Diagnosis not present

## 2018-10-18 DIAGNOSIS — I4891 Unspecified atrial fibrillation: Secondary | ICD-10-CM | POA: Diagnosis not present

## 2018-10-18 DIAGNOSIS — I110 Hypertensive heart disease with heart failure: Secondary | ICD-10-CM | POA: Diagnosis not present

## 2018-10-18 DIAGNOSIS — I5030 Unspecified diastolic (congestive) heart failure: Secondary | ICD-10-CM | POA: Diagnosis not present

## 2018-10-18 DIAGNOSIS — E1151 Type 2 diabetes mellitus with diabetic peripheral angiopathy without gangrene: Secondary | ICD-10-CM | POA: Diagnosis not present

## 2018-10-18 DIAGNOSIS — I70209 Unspecified atherosclerosis of native arteries of extremities, unspecified extremity: Secondary | ICD-10-CM | POA: Diagnosis not present

## 2018-10-19 DIAGNOSIS — L89629 Pressure ulcer of left heel, unspecified stage: Secondary | ICD-10-CM | POA: Diagnosis not present

## 2018-10-19 DIAGNOSIS — I5030 Unspecified diastolic (congestive) heart failure: Secondary | ICD-10-CM | POA: Diagnosis not present

## 2018-10-19 DIAGNOSIS — Z741 Need for assistance with personal care: Secondary | ICD-10-CM | POA: Diagnosis not present

## 2018-10-19 DIAGNOSIS — Z9981 Dependence on supplemental oxygen: Secondary | ICD-10-CM | POA: Diagnosis not present

## 2018-10-19 DIAGNOSIS — I110 Hypertensive heart disease with heart failure: Secondary | ICD-10-CM | POA: Diagnosis not present

## 2018-10-19 DIAGNOSIS — J690 Pneumonitis due to inhalation of food and vomit: Secondary | ICD-10-CM | POA: Diagnosis not present

## 2018-10-19 DIAGNOSIS — L89610 Pressure ulcer of right heel, unstageable: Secondary | ICD-10-CM | POA: Diagnosis not present

## 2018-10-19 DIAGNOSIS — E1151 Type 2 diabetes mellitus with diabetic peripheral angiopathy without gangrene: Secondary | ICD-10-CM | POA: Diagnosis not present

## 2018-10-19 DIAGNOSIS — N401 Enlarged prostate with lower urinary tract symptoms: Secondary | ICD-10-CM | POA: Diagnosis not present

## 2018-10-19 DIAGNOSIS — I4891 Unspecified atrial fibrillation: Secondary | ICD-10-CM | POA: Diagnosis not present

## 2018-10-19 DIAGNOSIS — M109 Gout, unspecified: Secondary | ICD-10-CM | POA: Diagnosis not present

## 2018-10-19 DIAGNOSIS — I70209 Unspecified atherosclerosis of native arteries of extremities, unspecified extremity: Secondary | ICD-10-CM | POA: Diagnosis not present

## 2018-10-19 DIAGNOSIS — I69391 Dysphagia following cerebral infarction: Secondary | ICD-10-CM | POA: Diagnosis not present

## 2018-10-22 DIAGNOSIS — I69391 Dysphagia following cerebral infarction: Secondary | ICD-10-CM | POA: Diagnosis not present

## 2018-10-22 DIAGNOSIS — I5030 Unspecified diastolic (congestive) heart failure: Secondary | ICD-10-CM | POA: Diagnosis not present

## 2018-10-22 DIAGNOSIS — I4891 Unspecified atrial fibrillation: Secondary | ICD-10-CM | POA: Diagnosis not present

## 2018-10-22 DIAGNOSIS — L89629 Pressure ulcer of left heel, unspecified stage: Secondary | ICD-10-CM | POA: Diagnosis not present

## 2018-10-22 DIAGNOSIS — I110 Hypertensive heart disease with heart failure: Secondary | ICD-10-CM | POA: Diagnosis not present

## 2018-10-22 DIAGNOSIS — J690 Pneumonitis due to inhalation of food and vomit: Secondary | ICD-10-CM | POA: Diagnosis not present

## 2018-10-24 DIAGNOSIS — I4891 Unspecified atrial fibrillation: Secondary | ICD-10-CM | POA: Diagnosis not present

## 2018-10-24 DIAGNOSIS — L89610 Pressure ulcer of right heel, unstageable: Secondary | ICD-10-CM | POA: Diagnosis not present

## 2018-10-24 DIAGNOSIS — I5030 Unspecified diastolic (congestive) heart failure: Secondary | ICD-10-CM | POA: Diagnosis not present

## 2018-10-24 DIAGNOSIS — E1151 Type 2 diabetes mellitus with diabetic peripheral angiopathy without gangrene: Secondary | ICD-10-CM | POA: Diagnosis not present

## 2018-10-24 DIAGNOSIS — I70209 Unspecified atherosclerosis of native arteries of extremities, unspecified extremity: Secondary | ICD-10-CM | POA: Diagnosis not present

## 2018-10-24 DIAGNOSIS — J690 Pneumonitis due to inhalation of food and vomit: Secondary | ICD-10-CM | POA: Diagnosis not present

## 2018-10-24 DIAGNOSIS — Z741 Need for assistance with personal care: Secondary | ICD-10-CM | POA: Diagnosis not present

## 2018-10-24 DIAGNOSIS — M109 Gout, unspecified: Secondary | ICD-10-CM | POA: Diagnosis not present

## 2018-10-24 DIAGNOSIS — I110 Hypertensive heart disease with heart failure: Secondary | ICD-10-CM | POA: Diagnosis not present

## 2018-10-24 DIAGNOSIS — I69391 Dysphagia following cerebral infarction: Secondary | ICD-10-CM | POA: Diagnosis not present

## 2018-10-24 DIAGNOSIS — Z9981 Dependence on supplemental oxygen: Secondary | ICD-10-CM | POA: Diagnosis not present

## 2018-10-24 DIAGNOSIS — L89629 Pressure ulcer of left heel, unspecified stage: Secondary | ICD-10-CM | POA: Diagnosis not present

## 2018-10-29 DIAGNOSIS — I4891 Unspecified atrial fibrillation: Secondary | ICD-10-CM | POA: Diagnosis not present

## 2018-10-29 DIAGNOSIS — I69391 Dysphagia following cerebral infarction: Secondary | ICD-10-CM | POA: Diagnosis not present

## 2018-10-29 DIAGNOSIS — J690 Pneumonitis due to inhalation of food and vomit: Secondary | ICD-10-CM | POA: Diagnosis not present

## 2018-10-29 DIAGNOSIS — L89629 Pressure ulcer of left heel, unspecified stage: Secondary | ICD-10-CM | POA: Diagnosis not present

## 2018-10-29 DIAGNOSIS — I110 Hypertensive heart disease with heart failure: Secondary | ICD-10-CM | POA: Diagnosis not present

## 2018-10-29 DIAGNOSIS — I5030 Unspecified diastolic (congestive) heart failure: Secondary | ICD-10-CM | POA: Diagnosis not present

## 2018-11-05 DIAGNOSIS — J690 Pneumonitis due to inhalation of food and vomit: Secondary | ICD-10-CM | POA: Diagnosis not present

## 2018-11-05 DIAGNOSIS — I110 Hypertensive heart disease with heart failure: Secondary | ICD-10-CM | POA: Diagnosis not present

## 2018-11-05 DIAGNOSIS — I69391 Dysphagia following cerebral infarction: Secondary | ICD-10-CM | POA: Diagnosis not present

## 2018-11-05 DIAGNOSIS — I5030 Unspecified diastolic (congestive) heart failure: Secondary | ICD-10-CM | POA: Diagnosis not present

## 2018-11-05 DIAGNOSIS — L89629 Pressure ulcer of left heel, unspecified stage: Secondary | ICD-10-CM | POA: Diagnosis not present

## 2018-11-05 DIAGNOSIS — I4891 Unspecified atrial fibrillation: Secondary | ICD-10-CM | POA: Diagnosis not present

## 2018-11-09 DIAGNOSIS — I110 Hypertensive heart disease with heart failure: Secondary | ICD-10-CM | POA: Diagnosis not present

## 2018-11-09 DIAGNOSIS — I5030 Unspecified diastolic (congestive) heart failure: Secondary | ICD-10-CM | POA: Diagnosis not present

## 2018-11-09 DIAGNOSIS — J690 Pneumonitis due to inhalation of food and vomit: Secondary | ICD-10-CM | POA: Diagnosis not present

## 2018-11-09 DIAGNOSIS — I4891 Unspecified atrial fibrillation: Secondary | ICD-10-CM | POA: Diagnosis not present

## 2018-11-09 DIAGNOSIS — I69391 Dysphagia following cerebral infarction: Secondary | ICD-10-CM | POA: Diagnosis not present

## 2018-11-09 DIAGNOSIS — L89629 Pressure ulcer of left heel, unspecified stage: Secondary | ICD-10-CM | POA: Diagnosis not present

## 2018-11-12 DIAGNOSIS — J690 Pneumonitis due to inhalation of food and vomit: Secondary | ICD-10-CM | POA: Diagnosis not present

## 2018-11-12 DIAGNOSIS — I4891 Unspecified atrial fibrillation: Secondary | ICD-10-CM | POA: Diagnosis not present

## 2018-11-12 DIAGNOSIS — I5030 Unspecified diastolic (congestive) heart failure: Secondary | ICD-10-CM | POA: Diagnosis not present

## 2018-11-12 DIAGNOSIS — I110 Hypertensive heart disease with heart failure: Secondary | ICD-10-CM | POA: Diagnosis not present

## 2018-11-12 DIAGNOSIS — I69391 Dysphagia following cerebral infarction: Secondary | ICD-10-CM | POA: Diagnosis not present

## 2018-11-12 DIAGNOSIS — L89629 Pressure ulcer of left heel, unspecified stage: Secondary | ICD-10-CM | POA: Diagnosis not present

## 2018-11-16 DIAGNOSIS — I5030 Unspecified diastolic (congestive) heart failure: Secondary | ICD-10-CM | POA: Diagnosis not present

## 2018-11-16 DIAGNOSIS — J690 Pneumonitis due to inhalation of food and vomit: Secondary | ICD-10-CM | POA: Diagnosis not present

## 2018-11-16 DIAGNOSIS — I69391 Dysphagia following cerebral infarction: Secondary | ICD-10-CM | POA: Diagnosis not present

## 2018-11-16 DIAGNOSIS — I110 Hypertensive heart disease with heart failure: Secondary | ICD-10-CM | POA: Diagnosis not present

## 2018-11-16 DIAGNOSIS — L89629 Pressure ulcer of left heel, unspecified stage: Secondary | ICD-10-CM | POA: Diagnosis not present

## 2018-11-16 DIAGNOSIS — I4891 Unspecified atrial fibrillation: Secondary | ICD-10-CM | POA: Diagnosis not present

## 2018-11-17 ENCOUNTER — Ambulatory Visit: Payer: Self-pay

## 2018-11-18 DIAGNOSIS — I5030 Unspecified diastolic (congestive) heart failure: Secondary | ICD-10-CM | POA: Diagnosis not present

## 2018-11-18 DIAGNOSIS — I70209 Unspecified atherosclerosis of native arteries of extremities, unspecified extremity: Secondary | ICD-10-CM | POA: Diagnosis not present

## 2018-11-18 DIAGNOSIS — L89629 Pressure ulcer of left heel, unspecified stage: Secondary | ICD-10-CM | POA: Diagnosis not present

## 2018-11-18 DIAGNOSIS — N401 Enlarged prostate with lower urinary tract symptoms: Secondary | ICD-10-CM | POA: Diagnosis not present

## 2018-11-18 DIAGNOSIS — Z741 Need for assistance with personal care: Secondary | ICD-10-CM | POA: Diagnosis not present

## 2018-11-18 DIAGNOSIS — Z9981 Dependence on supplemental oxygen: Secondary | ICD-10-CM | POA: Diagnosis not present

## 2018-11-18 DIAGNOSIS — I69391 Dysphagia following cerebral infarction: Secondary | ICD-10-CM | POA: Diagnosis not present

## 2018-11-18 DIAGNOSIS — I4891 Unspecified atrial fibrillation: Secondary | ICD-10-CM | POA: Diagnosis not present

## 2018-11-18 DIAGNOSIS — L89610 Pressure ulcer of right heel, unstageable: Secondary | ICD-10-CM | POA: Diagnosis not present

## 2018-11-18 DIAGNOSIS — I110 Hypertensive heart disease with heart failure: Secondary | ICD-10-CM | POA: Diagnosis not present

## 2018-11-18 DIAGNOSIS — M109 Gout, unspecified: Secondary | ICD-10-CM | POA: Diagnosis not present

## 2018-11-18 DIAGNOSIS — J690 Pneumonitis due to inhalation of food and vomit: Secondary | ICD-10-CM | POA: Diagnosis not present

## 2018-11-18 DIAGNOSIS — E1151 Type 2 diabetes mellitus with diabetic peripheral angiopathy without gangrene: Secondary | ICD-10-CM | POA: Diagnosis not present

## 2018-11-19 DIAGNOSIS — L89629 Pressure ulcer of left heel, unspecified stage: Secondary | ICD-10-CM | POA: Diagnosis not present

## 2018-11-19 DIAGNOSIS — I110 Hypertensive heart disease with heart failure: Secondary | ICD-10-CM | POA: Diagnosis not present

## 2018-11-19 DIAGNOSIS — I4891 Unspecified atrial fibrillation: Secondary | ICD-10-CM | POA: Diagnosis not present

## 2018-11-19 DIAGNOSIS — I5030 Unspecified diastolic (congestive) heart failure: Secondary | ICD-10-CM | POA: Diagnosis not present

## 2018-11-19 DIAGNOSIS — I69391 Dysphagia following cerebral infarction: Secondary | ICD-10-CM | POA: Diagnosis not present

## 2018-11-19 DIAGNOSIS — J690 Pneumonitis due to inhalation of food and vomit: Secondary | ICD-10-CM | POA: Diagnosis not present

## 2018-11-26 DIAGNOSIS — I5030 Unspecified diastolic (congestive) heart failure: Secondary | ICD-10-CM | POA: Diagnosis not present

## 2018-11-26 DIAGNOSIS — I110 Hypertensive heart disease with heart failure: Secondary | ICD-10-CM | POA: Diagnosis not present

## 2018-11-26 DIAGNOSIS — I69391 Dysphagia following cerebral infarction: Secondary | ICD-10-CM | POA: Diagnosis not present

## 2018-11-26 DIAGNOSIS — J690 Pneumonitis due to inhalation of food and vomit: Secondary | ICD-10-CM | POA: Diagnosis not present

## 2018-11-26 DIAGNOSIS — I4891 Unspecified atrial fibrillation: Secondary | ICD-10-CM | POA: Diagnosis not present

## 2018-11-26 DIAGNOSIS — L89629 Pressure ulcer of left heel, unspecified stage: Secondary | ICD-10-CM | POA: Diagnosis not present

## 2018-11-29 DIAGNOSIS — I69391 Dysphagia following cerebral infarction: Secondary | ICD-10-CM | POA: Diagnosis not present

## 2018-11-29 DIAGNOSIS — I4891 Unspecified atrial fibrillation: Secondary | ICD-10-CM | POA: Diagnosis not present

## 2018-11-29 DIAGNOSIS — L89629 Pressure ulcer of left heel, unspecified stage: Secondary | ICD-10-CM | POA: Diagnosis not present

## 2018-11-29 DIAGNOSIS — I110 Hypertensive heart disease with heart failure: Secondary | ICD-10-CM | POA: Diagnosis not present

## 2018-11-29 DIAGNOSIS — I5030 Unspecified diastolic (congestive) heart failure: Secondary | ICD-10-CM | POA: Diagnosis not present

## 2018-11-29 DIAGNOSIS — J690 Pneumonitis due to inhalation of food and vomit: Secondary | ICD-10-CM | POA: Diagnosis not present

## 2018-12-03 DIAGNOSIS — I4891 Unspecified atrial fibrillation: Secondary | ICD-10-CM | POA: Diagnosis not present

## 2018-12-03 DIAGNOSIS — J690 Pneumonitis due to inhalation of food and vomit: Secondary | ICD-10-CM | POA: Diagnosis not present

## 2018-12-03 DIAGNOSIS — I110 Hypertensive heart disease with heart failure: Secondary | ICD-10-CM | POA: Diagnosis not present

## 2018-12-03 DIAGNOSIS — L89629 Pressure ulcer of left heel, unspecified stage: Secondary | ICD-10-CM | POA: Diagnosis not present

## 2018-12-03 DIAGNOSIS — I5030 Unspecified diastolic (congestive) heart failure: Secondary | ICD-10-CM | POA: Diagnosis not present

## 2018-12-03 DIAGNOSIS — I69391 Dysphagia following cerebral infarction: Secondary | ICD-10-CM | POA: Diagnosis not present

## 2018-12-07 DIAGNOSIS — I4891 Unspecified atrial fibrillation: Secondary | ICD-10-CM | POA: Diagnosis not present

## 2018-12-07 DIAGNOSIS — J690 Pneumonitis due to inhalation of food and vomit: Secondary | ICD-10-CM | POA: Diagnosis not present

## 2018-12-07 DIAGNOSIS — I69391 Dysphagia following cerebral infarction: Secondary | ICD-10-CM | POA: Diagnosis not present

## 2018-12-07 DIAGNOSIS — L89629 Pressure ulcer of left heel, unspecified stage: Secondary | ICD-10-CM | POA: Diagnosis not present

## 2018-12-07 DIAGNOSIS — I110 Hypertensive heart disease with heart failure: Secondary | ICD-10-CM | POA: Diagnosis not present

## 2018-12-07 DIAGNOSIS — I5030 Unspecified diastolic (congestive) heart failure: Secondary | ICD-10-CM | POA: Diagnosis not present

## 2018-12-10 DIAGNOSIS — I110 Hypertensive heart disease with heart failure: Secondary | ICD-10-CM | POA: Diagnosis not present

## 2018-12-10 DIAGNOSIS — I5030 Unspecified diastolic (congestive) heart failure: Secondary | ICD-10-CM | POA: Diagnosis not present

## 2018-12-10 DIAGNOSIS — L89629 Pressure ulcer of left heel, unspecified stage: Secondary | ICD-10-CM | POA: Diagnosis not present

## 2018-12-10 DIAGNOSIS — I69391 Dysphagia following cerebral infarction: Secondary | ICD-10-CM | POA: Diagnosis not present

## 2018-12-10 DIAGNOSIS — J690 Pneumonitis due to inhalation of food and vomit: Secondary | ICD-10-CM | POA: Diagnosis not present

## 2018-12-10 DIAGNOSIS — I4891 Unspecified atrial fibrillation: Secondary | ICD-10-CM | POA: Diagnosis not present

## 2018-12-17 DIAGNOSIS — I110 Hypertensive heart disease with heart failure: Secondary | ICD-10-CM | POA: Diagnosis not present

## 2018-12-17 DIAGNOSIS — I69391 Dysphagia following cerebral infarction: Secondary | ICD-10-CM | POA: Diagnosis not present

## 2018-12-17 DIAGNOSIS — I5030 Unspecified diastolic (congestive) heart failure: Secondary | ICD-10-CM | POA: Diagnosis not present

## 2018-12-17 DIAGNOSIS — I4891 Unspecified atrial fibrillation: Secondary | ICD-10-CM | POA: Diagnosis not present

## 2018-12-17 DIAGNOSIS — J690 Pneumonitis due to inhalation of food and vomit: Secondary | ICD-10-CM | POA: Diagnosis not present

## 2018-12-17 DIAGNOSIS — L89629 Pressure ulcer of left heel, unspecified stage: Secondary | ICD-10-CM | POA: Diagnosis not present

## 2018-12-19 DIAGNOSIS — L89629 Pressure ulcer of left heel, unspecified stage: Secondary | ICD-10-CM | POA: Diagnosis not present

## 2018-12-19 DIAGNOSIS — I110 Hypertensive heart disease with heart failure: Secondary | ICD-10-CM | POA: Diagnosis not present

## 2018-12-19 DIAGNOSIS — J690 Pneumonitis due to inhalation of food and vomit: Secondary | ICD-10-CM | POA: Diagnosis not present

## 2018-12-19 DIAGNOSIS — Z741 Need for assistance with personal care: Secondary | ICD-10-CM | POA: Diagnosis not present

## 2018-12-19 DIAGNOSIS — I70209 Unspecified atherosclerosis of native arteries of extremities, unspecified extremity: Secondary | ICD-10-CM | POA: Diagnosis not present

## 2018-12-19 DIAGNOSIS — N401 Enlarged prostate with lower urinary tract symptoms: Secondary | ICD-10-CM | POA: Diagnosis not present

## 2018-12-19 DIAGNOSIS — I5030 Unspecified diastolic (congestive) heart failure: Secondary | ICD-10-CM | POA: Diagnosis not present

## 2018-12-19 DIAGNOSIS — Z9981 Dependence on supplemental oxygen: Secondary | ICD-10-CM | POA: Diagnosis not present

## 2018-12-19 DIAGNOSIS — I69391 Dysphagia following cerebral infarction: Secondary | ICD-10-CM | POA: Diagnosis not present

## 2018-12-19 DIAGNOSIS — M109 Gout, unspecified: Secondary | ICD-10-CM | POA: Diagnosis not present

## 2018-12-19 DIAGNOSIS — E1151 Type 2 diabetes mellitus with diabetic peripheral angiopathy without gangrene: Secondary | ICD-10-CM | POA: Diagnosis not present

## 2018-12-19 DIAGNOSIS — I4891 Unspecified atrial fibrillation: Secondary | ICD-10-CM | POA: Diagnosis not present

## 2018-12-19 DIAGNOSIS — L89610 Pressure ulcer of right heel, unstageable: Secondary | ICD-10-CM | POA: Diagnosis not present

## 2018-12-23 DIAGNOSIS — L89892 Pressure ulcer of other site, stage 2: Secondary | ICD-10-CM | POA: Diagnosis not present

## 2018-12-23 DIAGNOSIS — E1151 Type 2 diabetes mellitus with diabetic peripheral angiopathy without gangrene: Secondary | ICD-10-CM | POA: Diagnosis not present

## 2018-12-23 DIAGNOSIS — I70209 Unspecified atherosclerosis of native arteries of extremities, unspecified extremity: Secondary | ICD-10-CM | POA: Diagnosis not present

## 2018-12-23 DIAGNOSIS — L89612 Pressure ulcer of right heel, stage 2: Secondary | ICD-10-CM | POA: Diagnosis not present

## 2018-12-23 DIAGNOSIS — L89512 Pressure ulcer of right ankle, stage 2: Secondary | ICD-10-CM | POA: Diagnosis not present

## 2018-12-23 DIAGNOSIS — R32 Unspecified urinary incontinence: Secondary | ICD-10-CM | POA: Diagnosis not present

## 2018-12-23 DIAGNOSIS — L89629 Pressure ulcer of left heel, unspecified stage: Secondary | ICD-10-CM | POA: Diagnosis not present

## 2018-12-23 DIAGNOSIS — I69391 Dysphagia following cerebral infarction: Secondary | ICD-10-CM | POA: Diagnosis not present

## 2018-12-23 DIAGNOSIS — J690 Pneumonitis due to inhalation of food and vomit: Secondary | ICD-10-CM | POA: Diagnosis not present

## 2018-12-23 DIAGNOSIS — I5030 Unspecified diastolic (congestive) heart failure: Secondary | ICD-10-CM | POA: Diagnosis not present

## 2018-12-23 DIAGNOSIS — I4891 Unspecified atrial fibrillation: Secondary | ICD-10-CM | POA: Diagnosis not present

## 2018-12-23 DIAGNOSIS — I110 Hypertensive heart disease with heart failure: Secondary | ICD-10-CM | POA: Diagnosis not present

## 2018-12-24 DIAGNOSIS — I5030 Unspecified diastolic (congestive) heart failure: Secondary | ICD-10-CM | POA: Diagnosis not present

## 2018-12-24 DIAGNOSIS — I69391 Dysphagia following cerebral infarction: Secondary | ICD-10-CM | POA: Diagnosis not present

## 2018-12-24 DIAGNOSIS — I4891 Unspecified atrial fibrillation: Secondary | ICD-10-CM | POA: Diagnosis not present

## 2018-12-24 DIAGNOSIS — I110 Hypertensive heart disease with heart failure: Secondary | ICD-10-CM | POA: Diagnosis not present

## 2018-12-24 DIAGNOSIS — J690 Pneumonitis due to inhalation of food and vomit: Secondary | ICD-10-CM | POA: Diagnosis not present

## 2018-12-24 DIAGNOSIS — L89629 Pressure ulcer of left heel, unspecified stage: Secondary | ICD-10-CM | POA: Diagnosis not present

## 2018-12-28 DIAGNOSIS — I4891 Unspecified atrial fibrillation: Secondary | ICD-10-CM | POA: Diagnosis not present

## 2018-12-28 DIAGNOSIS — I110 Hypertensive heart disease with heart failure: Secondary | ICD-10-CM | POA: Diagnosis not present

## 2018-12-28 DIAGNOSIS — J690 Pneumonitis due to inhalation of food and vomit: Secondary | ICD-10-CM | POA: Diagnosis not present

## 2018-12-28 DIAGNOSIS — I5030 Unspecified diastolic (congestive) heart failure: Secondary | ICD-10-CM | POA: Diagnosis not present

## 2018-12-28 DIAGNOSIS — L89629 Pressure ulcer of left heel, unspecified stage: Secondary | ICD-10-CM | POA: Diagnosis not present

## 2018-12-28 DIAGNOSIS — I69391 Dysphagia following cerebral infarction: Secondary | ICD-10-CM | POA: Diagnosis not present

## 2018-12-29 DIAGNOSIS — I110 Hypertensive heart disease with heart failure: Secondary | ICD-10-CM | POA: Diagnosis not present

## 2018-12-29 DIAGNOSIS — I4891 Unspecified atrial fibrillation: Secondary | ICD-10-CM | POA: Diagnosis not present

## 2018-12-29 DIAGNOSIS — L89629 Pressure ulcer of left heel, unspecified stage: Secondary | ICD-10-CM | POA: Diagnosis not present

## 2018-12-29 DIAGNOSIS — I69391 Dysphagia following cerebral infarction: Secondary | ICD-10-CM | POA: Diagnosis not present

## 2018-12-29 DIAGNOSIS — I5030 Unspecified diastolic (congestive) heart failure: Secondary | ICD-10-CM | POA: Diagnosis not present

## 2018-12-29 DIAGNOSIS — J690 Pneumonitis due to inhalation of food and vomit: Secondary | ICD-10-CM | POA: Diagnosis not present

## 2018-12-31 DIAGNOSIS — I4891 Unspecified atrial fibrillation: Secondary | ICD-10-CM | POA: Diagnosis not present

## 2018-12-31 DIAGNOSIS — I110 Hypertensive heart disease with heart failure: Secondary | ICD-10-CM | POA: Diagnosis not present

## 2018-12-31 DIAGNOSIS — L89629 Pressure ulcer of left heel, unspecified stage: Secondary | ICD-10-CM | POA: Diagnosis not present

## 2018-12-31 DIAGNOSIS — I5030 Unspecified diastolic (congestive) heart failure: Secondary | ICD-10-CM | POA: Diagnosis not present

## 2018-12-31 DIAGNOSIS — J690 Pneumonitis due to inhalation of food and vomit: Secondary | ICD-10-CM | POA: Diagnosis not present

## 2018-12-31 DIAGNOSIS — I69391 Dysphagia following cerebral infarction: Secondary | ICD-10-CM | POA: Diagnosis not present

## 2019-01-04 DIAGNOSIS — L89629 Pressure ulcer of left heel, unspecified stage: Secondary | ICD-10-CM | POA: Diagnosis not present

## 2019-01-04 DIAGNOSIS — I5030 Unspecified diastolic (congestive) heart failure: Secondary | ICD-10-CM | POA: Diagnosis not present

## 2019-01-04 DIAGNOSIS — I110 Hypertensive heart disease with heart failure: Secondary | ICD-10-CM | POA: Diagnosis not present

## 2019-01-04 DIAGNOSIS — I4891 Unspecified atrial fibrillation: Secondary | ICD-10-CM | POA: Diagnosis not present

## 2019-01-04 DIAGNOSIS — I69391 Dysphagia following cerebral infarction: Secondary | ICD-10-CM | POA: Diagnosis not present

## 2019-01-04 DIAGNOSIS — J690 Pneumonitis due to inhalation of food and vomit: Secondary | ICD-10-CM | POA: Diagnosis not present

## 2019-01-07 DIAGNOSIS — I110 Hypertensive heart disease with heart failure: Secondary | ICD-10-CM | POA: Diagnosis not present

## 2019-01-07 DIAGNOSIS — I5030 Unspecified diastolic (congestive) heart failure: Secondary | ICD-10-CM | POA: Diagnosis not present

## 2019-01-07 DIAGNOSIS — L89629 Pressure ulcer of left heel, unspecified stage: Secondary | ICD-10-CM | POA: Diagnosis not present

## 2019-01-07 DIAGNOSIS — I69391 Dysphagia following cerebral infarction: Secondary | ICD-10-CM | POA: Diagnosis not present

## 2019-01-07 DIAGNOSIS — J690 Pneumonitis due to inhalation of food and vomit: Secondary | ICD-10-CM | POA: Diagnosis not present

## 2019-01-07 DIAGNOSIS — I4891 Unspecified atrial fibrillation: Secondary | ICD-10-CM | POA: Diagnosis not present

## 2019-01-10 DIAGNOSIS — I4891 Unspecified atrial fibrillation: Secondary | ICD-10-CM | POA: Diagnosis not present

## 2019-01-10 DIAGNOSIS — J690 Pneumonitis due to inhalation of food and vomit: Secondary | ICD-10-CM | POA: Diagnosis not present

## 2019-01-10 DIAGNOSIS — I69391 Dysphagia following cerebral infarction: Secondary | ICD-10-CM | POA: Diagnosis not present

## 2019-01-10 DIAGNOSIS — L89629 Pressure ulcer of left heel, unspecified stage: Secondary | ICD-10-CM | POA: Diagnosis not present

## 2019-01-10 DIAGNOSIS — I110 Hypertensive heart disease with heart failure: Secondary | ICD-10-CM | POA: Diagnosis not present

## 2019-01-10 DIAGNOSIS — I5030 Unspecified diastolic (congestive) heart failure: Secondary | ICD-10-CM | POA: Diagnosis not present

## 2019-01-11 DIAGNOSIS — I5030 Unspecified diastolic (congestive) heart failure: Secondary | ICD-10-CM | POA: Diagnosis not present

## 2019-01-11 DIAGNOSIS — J690 Pneumonitis due to inhalation of food and vomit: Secondary | ICD-10-CM | POA: Diagnosis not present

## 2019-01-11 DIAGNOSIS — L89629 Pressure ulcer of left heel, unspecified stage: Secondary | ICD-10-CM | POA: Diagnosis not present

## 2019-01-11 DIAGNOSIS — I110 Hypertensive heart disease with heart failure: Secondary | ICD-10-CM | POA: Diagnosis not present

## 2019-01-11 DIAGNOSIS — I4891 Unspecified atrial fibrillation: Secondary | ICD-10-CM | POA: Diagnosis not present

## 2019-01-11 DIAGNOSIS — I69391 Dysphagia following cerebral infarction: Secondary | ICD-10-CM | POA: Diagnosis not present

## 2019-01-12 DIAGNOSIS — I69391 Dysphagia following cerebral infarction: Secondary | ICD-10-CM | POA: Diagnosis not present

## 2019-01-12 DIAGNOSIS — I5030 Unspecified diastolic (congestive) heart failure: Secondary | ICD-10-CM | POA: Diagnosis not present

## 2019-01-12 DIAGNOSIS — L89629 Pressure ulcer of left heel, unspecified stage: Secondary | ICD-10-CM | POA: Diagnosis not present

## 2019-01-12 DIAGNOSIS — I110 Hypertensive heart disease with heart failure: Secondary | ICD-10-CM | POA: Diagnosis not present

## 2019-01-12 DIAGNOSIS — I4891 Unspecified atrial fibrillation: Secondary | ICD-10-CM | POA: Diagnosis not present

## 2019-01-12 DIAGNOSIS — J690 Pneumonitis due to inhalation of food and vomit: Secondary | ICD-10-CM | POA: Diagnosis not present

## 2019-01-18 DIAGNOSIS — L89612 Pressure ulcer of right heel, stage 2: Secondary | ICD-10-CM | POA: Diagnosis not present

## 2019-01-18 DIAGNOSIS — I110 Hypertensive heart disease with heart failure: Secondary | ICD-10-CM | POA: Diagnosis not present

## 2019-01-18 DIAGNOSIS — N401 Enlarged prostate with lower urinary tract symptoms: Secondary | ICD-10-CM | POA: Diagnosis not present

## 2019-01-18 DIAGNOSIS — I70209 Unspecified atherosclerosis of native arteries of extremities, unspecified extremity: Secondary | ICD-10-CM | POA: Diagnosis not present

## 2019-01-18 DIAGNOSIS — R32 Unspecified urinary incontinence: Secondary | ICD-10-CM | POA: Diagnosis not present

## 2019-01-18 DIAGNOSIS — I5030 Unspecified diastolic (congestive) heart failure: Secondary | ICD-10-CM | POA: Diagnosis not present

## 2019-01-18 DIAGNOSIS — Z7401 Bed confinement status: Secondary | ICD-10-CM | POA: Diagnosis not present

## 2019-01-18 DIAGNOSIS — M109 Gout, unspecified: Secondary | ICD-10-CM | POA: Diagnosis not present

## 2019-01-18 DIAGNOSIS — J690 Pneumonitis due to inhalation of food and vomit: Secondary | ICD-10-CM | POA: Diagnosis not present

## 2019-01-18 DIAGNOSIS — L89512 Pressure ulcer of right ankle, stage 2: Secondary | ICD-10-CM | POA: Diagnosis not present

## 2019-01-18 DIAGNOSIS — I69391 Dysphagia following cerebral infarction: Secondary | ICD-10-CM | POA: Diagnosis not present

## 2019-01-18 DIAGNOSIS — E1151 Type 2 diabetes mellitus with diabetic peripheral angiopathy without gangrene: Secondary | ICD-10-CM | POA: Diagnosis not present

## 2019-01-18 DIAGNOSIS — L89629 Pressure ulcer of left heel, unspecified stage: Secondary | ICD-10-CM | POA: Diagnosis not present

## 2019-01-18 DIAGNOSIS — Z741 Need for assistance with personal care: Secondary | ICD-10-CM | POA: Diagnosis not present

## 2019-01-18 DIAGNOSIS — Z9981 Dependence on supplemental oxygen: Secondary | ICD-10-CM | POA: Diagnosis not present

## 2019-01-18 DIAGNOSIS — L89892 Pressure ulcer of other site, stage 2: Secondary | ICD-10-CM | POA: Diagnosis not present

## 2019-01-18 DIAGNOSIS — I4891 Unspecified atrial fibrillation: Secondary | ICD-10-CM | POA: Diagnosis not present

## 2019-01-21 DIAGNOSIS — I5030 Unspecified diastolic (congestive) heart failure: Secondary | ICD-10-CM | POA: Diagnosis not present

## 2019-01-21 DIAGNOSIS — I4891 Unspecified atrial fibrillation: Secondary | ICD-10-CM | POA: Diagnosis not present

## 2019-01-21 DIAGNOSIS — L89629 Pressure ulcer of left heel, unspecified stage: Secondary | ICD-10-CM | POA: Diagnosis not present

## 2019-01-21 DIAGNOSIS — J690 Pneumonitis due to inhalation of food and vomit: Secondary | ICD-10-CM | POA: Diagnosis not present

## 2019-01-21 DIAGNOSIS — I69391 Dysphagia following cerebral infarction: Secondary | ICD-10-CM | POA: Diagnosis not present

## 2019-01-21 DIAGNOSIS — I110 Hypertensive heart disease with heart failure: Secondary | ICD-10-CM | POA: Diagnosis not present

## 2019-01-24 DIAGNOSIS — I69391 Dysphagia following cerebral infarction: Secondary | ICD-10-CM | POA: Diagnosis not present

## 2019-01-24 DIAGNOSIS — I110 Hypertensive heart disease with heart failure: Secondary | ICD-10-CM | POA: Diagnosis not present

## 2019-01-24 DIAGNOSIS — L89629 Pressure ulcer of left heel, unspecified stage: Secondary | ICD-10-CM | POA: Diagnosis not present

## 2019-01-24 DIAGNOSIS — I5030 Unspecified diastolic (congestive) heart failure: Secondary | ICD-10-CM | POA: Diagnosis not present

## 2019-01-24 DIAGNOSIS — I4891 Unspecified atrial fibrillation: Secondary | ICD-10-CM | POA: Diagnosis not present

## 2019-01-24 DIAGNOSIS — J690 Pneumonitis due to inhalation of food and vomit: Secondary | ICD-10-CM | POA: Diagnosis not present

## 2019-01-25 DIAGNOSIS — I5030 Unspecified diastolic (congestive) heart failure: Secondary | ICD-10-CM | POA: Diagnosis not present

## 2019-01-25 DIAGNOSIS — I4891 Unspecified atrial fibrillation: Secondary | ICD-10-CM | POA: Diagnosis not present

## 2019-01-25 DIAGNOSIS — I110 Hypertensive heart disease with heart failure: Secondary | ICD-10-CM | POA: Diagnosis not present

## 2019-01-25 DIAGNOSIS — L89629 Pressure ulcer of left heel, unspecified stage: Secondary | ICD-10-CM | POA: Diagnosis not present

## 2019-01-25 DIAGNOSIS — J690 Pneumonitis due to inhalation of food and vomit: Secondary | ICD-10-CM | POA: Diagnosis not present

## 2019-01-25 DIAGNOSIS — I69391 Dysphagia following cerebral infarction: Secondary | ICD-10-CM | POA: Diagnosis not present

## 2019-01-28 DIAGNOSIS — L89629 Pressure ulcer of left heel, unspecified stage: Secondary | ICD-10-CM | POA: Diagnosis not present

## 2019-01-28 DIAGNOSIS — I4891 Unspecified atrial fibrillation: Secondary | ICD-10-CM | POA: Diagnosis not present

## 2019-01-28 DIAGNOSIS — I5030 Unspecified diastolic (congestive) heart failure: Secondary | ICD-10-CM | POA: Diagnosis not present

## 2019-01-28 DIAGNOSIS — J690 Pneumonitis due to inhalation of food and vomit: Secondary | ICD-10-CM | POA: Diagnosis not present

## 2019-01-28 DIAGNOSIS — I110 Hypertensive heart disease with heart failure: Secondary | ICD-10-CM | POA: Diagnosis not present

## 2019-01-28 DIAGNOSIS — I69391 Dysphagia following cerebral infarction: Secondary | ICD-10-CM | POA: Diagnosis not present

## 2019-02-01 DIAGNOSIS — I69391 Dysphagia following cerebral infarction: Secondary | ICD-10-CM | POA: Diagnosis not present

## 2019-02-01 DIAGNOSIS — J690 Pneumonitis due to inhalation of food and vomit: Secondary | ICD-10-CM | POA: Diagnosis not present

## 2019-02-01 DIAGNOSIS — I5030 Unspecified diastolic (congestive) heart failure: Secondary | ICD-10-CM | POA: Diagnosis not present

## 2019-02-01 DIAGNOSIS — I110 Hypertensive heart disease with heart failure: Secondary | ICD-10-CM | POA: Diagnosis not present

## 2019-02-01 DIAGNOSIS — L89629 Pressure ulcer of left heel, unspecified stage: Secondary | ICD-10-CM | POA: Diagnosis not present

## 2019-02-01 DIAGNOSIS — I4891 Unspecified atrial fibrillation: Secondary | ICD-10-CM | POA: Diagnosis not present

## 2019-02-04 DIAGNOSIS — L89629 Pressure ulcer of left heel, unspecified stage: Secondary | ICD-10-CM | POA: Diagnosis not present

## 2019-02-04 DIAGNOSIS — I4891 Unspecified atrial fibrillation: Secondary | ICD-10-CM | POA: Diagnosis not present

## 2019-02-04 DIAGNOSIS — I110 Hypertensive heart disease with heart failure: Secondary | ICD-10-CM | POA: Diagnosis not present

## 2019-02-04 DIAGNOSIS — I5030 Unspecified diastolic (congestive) heart failure: Secondary | ICD-10-CM | POA: Diagnosis not present

## 2019-02-04 DIAGNOSIS — I69391 Dysphagia following cerebral infarction: Secondary | ICD-10-CM | POA: Diagnosis not present

## 2019-02-04 DIAGNOSIS — J690 Pneumonitis due to inhalation of food and vomit: Secondary | ICD-10-CM | POA: Diagnosis not present

## 2019-02-07 DIAGNOSIS — I4891 Unspecified atrial fibrillation: Secondary | ICD-10-CM | POA: Diagnosis not present

## 2019-02-07 DIAGNOSIS — J690 Pneumonitis due to inhalation of food and vomit: Secondary | ICD-10-CM | POA: Diagnosis not present

## 2019-02-07 DIAGNOSIS — I69391 Dysphagia following cerebral infarction: Secondary | ICD-10-CM | POA: Diagnosis not present

## 2019-02-07 DIAGNOSIS — L89629 Pressure ulcer of left heel, unspecified stage: Secondary | ICD-10-CM | POA: Diagnosis not present

## 2019-02-07 DIAGNOSIS — I5030 Unspecified diastolic (congestive) heart failure: Secondary | ICD-10-CM | POA: Diagnosis not present

## 2019-02-07 DIAGNOSIS — I110 Hypertensive heart disease with heart failure: Secondary | ICD-10-CM | POA: Diagnosis not present

## 2019-02-10 DIAGNOSIS — I69391 Dysphagia following cerebral infarction: Secondary | ICD-10-CM | POA: Diagnosis not present

## 2019-02-10 DIAGNOSIS — I4891 Unspecified atrial fibrillation: Secondary | ICD-10-CM | POA: Diagnosis not present

## 2019-02-10 DIAGNOSIS — L89629 Pressure ulcer of left heel, unspecified stage: Secondary | ICD-10-CM | POA: Diagnosis not present

## 2019-02-10 DIAGNOSIS — I110 Hypertensive heart disease with heart failure: Secondary | ICD-10-CM | POA: Diagnosis not present

## 2019-02-10 DIAGNOSIS — J690 Pneumonitis due to inhalation of food and vomit: Secondary | ICD-10-CM | POA: Diagnosis not present

## 2019-02-10 DIAGNOSIS — I5030 Unspecified diastolic (congestive) heart failure: Secondary | ICD-10-CM | POA: Diagnosis not present

## 2019-02-15 DIAGNOSIS — I5030 Unspecified diastolic (congestive) heart failure: Secondary | ICD-10-CM | POA: Diagnosis not present

## 2019-02-15 DIAGNOSIS — I4891 Unspecified atrial fibrillation: Secondary | ICD-10-CM | POA: Diagnosis not present

## 2019-02-15 DIAGNOSIS — I110 Hypertensive heart disease with heart failure: Secondary | ICD-10-CM | POA: Diagnosis not present

## 2019-02-15 DIAGNOSIS — L89629 Pressure ulcer of left heel, unspecified stage: Secondary | ICD-10-CM | POA: Diagnosis not present

## 2019-02-15 DIAGNOSIS — I69391 Dysphagia following cerebral infarction: Secondary | ICD-10-CM | POA: Diagnosis not present

## 2019-02-15 DIAGNOSIS — J690 Pneumonitis due to inhalation of food and vomit: Secondary | ICD-10-CM | POA: Diagnosis not present

## 2019-02-17 DIAGNOSIS — I69391 Dysphagia following cerebral infarction: Secondary | ICD-10-CM | POA: Diagnosis not present

## 2019-02-17 DIAGNOSIS — I5030 Unspecified diastolic (congestive) heart failure: Secondary | ICD-10-CM | POA: Diagnosis not present

## 2019-02-17 DIAGNOSIS — I110 Hypertensive heart disease with heart failure: Secondary | ICD-10-CM | POA: Diagnosis not present

## 2019-02-17 DIAGNOSIS — I4891 Unspecified atrial fibrillation: Secondary | ICD-10-CM | POA: Diagnosis not present

## 2019-02-17 DIAGNOSIS — J690 Pneumonitis due to inhalation of food and vomit: Secondary | ICD-10-CM | POA: Diagnosis not present

## 2019-02-17 DIAGNOSIS — L89629 Pressure ulcer of left heel, unspecified stage: Secondary | ICD-10-CM | POA: Diagnosis not present

## 2019-02-18 DIAGNOSIS — I69391 Dysphagia following cerebral infarction: Secondary | ICD-10-CM | POA: Diagnosis not present

## 2019-02-18 DIAGNOSIS — Z7401 Bed confinement status: Secondary | ICD-10-CM | POA: Diagnosis not present

## 2019-02-18 DIAGNOSIS — I110 Hypertensive heart disease with heart failure: Secondary | ICD-10-CM | POA: Diagnosis not present

## 2019-02-18 DIAGNOSIS — Z741 Need for assistance with personal care: Secondary | ICD-10-CM | POA: Diagnosis not present

## 2019-02-18 DIAGNOSIS — E1151 Type 2 diabetes mellitus with diabetic peripheral angiopathy without gangrene: Secondary | ICD-10-CM | POA: Diagnosis not present

## 2019-02-18 DIAGNOSIS — Z9981 Dependence on supplemental oxygen: Secondary | ICD-10-CM | POA: Diagnosis not present

## 2019-02-18 DIAGNOSIS — M109 Gout, unspecified: Secondary | ICD-10-CM | POA: Diagnosis not present

## 2019-02-18 DIAGNOSIS — I70209 Unspecified atherosclerosis of native arteries of extremities, unspecified extremity: Secondary | ICD-10-CM | POA: Diagnosis not present

## 2019-02-18 DIAGNOSIS — I5030 Unspecified diastolic (congestive) heart failure: Secondary | ICD-10-CM | POA: Diagnosis not present

## 2019-02-18 DIAGNOSIS — L89620 Pressure ulcer of left heel, unstageable: Secondary | ICD-10-CM | POA: Diagnosis not present

## 2019-02-18 DIAGNOSIS — R32 Unspecified urinary incontinence: Secondary | ICD-10-CM | POA: Diagnosis not present

## 2019-02-18 DIAGNOSIS — N401 Enlarged prostate with lower urinary tract symptoms: Secondary | ICD-10-CM | POA: Diagnosis not present

## 2019-02-18 DIAGNOSIS — J690 Pneumonitis due to inhalation of food and vomit: Secondary | ICD-10-CM | POA: Diagnosis not present

## 2019-02-18 DIAGNOSIS — I4891 Unspecified atrial fibrillation: Secondary | ICD-10-CM | POA: Diagnosis not present

## 2019-02-21 DIAGNOSIS — I70209 Unspecified atherosclerosis of native arteries of extremities, unspecified extremity: Secondary | ICD-10-CM | POA: Diagnosis not present

## 2019-02-21 DIAGNOSIS — Z9981 Dependence on supplemental oxygen: Secondary | ICD-10-CM | POA: Diagnosis not present

## 2019-02-21 DIAGNOSIS — I4891 Unspecified atrial fibrillation: Secondary | ICD-10-CM | POA: Diagnosis not present

## 2019-02-21 DIAGNOSIS — J690 Pneumonitis due to inhalation of food and vomit: Secondary | ICD-10-CM | POA: Diagnosis not present

## 2019-02-21 DIAGNOSIS — Z741 Need for assistance with personal care: Secondary | ICD-10-CM | POA: Diagnosis not present

## 2019-02-21 DIAGNOSIS — R32 Unspecified urinary incontinence: Secondary | ICD-10-CM | POA: Diagnosis not present

## 2019-02-21 DIAGNOSIS — I110 Hypertensive heart disease with heart failure: Secondary | ICD-10-CM | POA: Diagnosis not present

## 2019-02-21 DIAGNOSIS — I5030 Unspecified diastolic (congestive) heart failure: Secondary | ICD-10-CM | POA: Diagnosis not present

## 2019-02-21 DIAGNOSIS — I69391 Dysphagia following cerebral infarction: Secondary | ICD-10-CM | POA: Diagnosis not present

## 2019-02-21 DIAGNOSIS — L89620 Pressure ulcer of left heel, unstageable: Secondary | ICD-10-CM | POA: Diagnosis not present

## 2019-02-21 DIAGNOSIS — E1151 Type 2 diabetes mellitus with diabetic peripheral angiopathy without gangrene: Secondary | ICD-10-CM | POA: Diagnosis not present

## 2019-02-21 DIAGNOSIS — Z7401 Bed confinement status: Secondary | ICD-10-CM | POA: Diagnosis not present

## 2019-02-22 DIAGNOSIS — E1151 Type 2 diabetes mellitus with diabetic peripheral angiopathy without gangrene: Secondary | ICD-10-CM | POA: Diagnosis not present

## 2019-02-22 DIAGNOSIS — J690 Pneumonitis due to inhalation of food and vomit: Secondary | ICD-10-CM | POA: Diagnosis not present

## 2019-02-22 DIAGNOSIS — I4891 Unspecified atrial fibrillation: Secondary | ICD-10-CM | POA: Diagnosis not present

## 2019-02-22 DIAGNOSIS — I69391 Dysphagia following cerebral infarction: Secondary | ICD-10-CM | POA: Diagnosis not present

## 2019-02-22 DIAGNOSIS — I5030 Unspecified diastolic (congestive) heart failure: Secondary | ICD-10-CM | POA: Diagnosis not present

## 2019-02-22 DIAGNOSIS — I110 Hypertensive heart disease with heart failure: Secondary | ICD-10-CM | POA: Diagnosis not present

## 2019-02-24 DIAGNOSIS — J690 Pneumonitis due to inhalation of food and vomit: Secondary | ICD-10-CM | POA: Diagnosis not present

## 2019-02-24 DIAGNOSIS — I5030 Unspecified diastolic (congestive) heart failure: Secondary | ICD-10-CM | POA: Diagnosis not present

## 2019-02-24 DIAGNOSIS — I4891 Unspecified atrial fibrillation: Secondary | ICD-10-CM | POA: Diagnosis not present

## 2019-02-24 DIAGNOSIS — I69391 Dysphagia following cerebral infarction: Secondary | ICD-10-CM | POA: Diagnosis not present

## 2019-02-24 DIAGNOSIS — I110 Hypertensive heart disease with heart failure: Secondary | ICD-10-CM | POA: Diagnosis not present

## 2019-02-24 DIAGNOSIS — E1151 Type 2 diabetes mellitus with diabetic peripheral angiopathy without gangrene: Secondary | ICD-10-CM | POA: Diagnosis not present

## 2019-02-25 DIAGNOSIS — I110 Hypertensive heart disease with heart failure: Secondary | ICD-10-CM | POA: Diagnosis not present

## 2019-02-25 DIAGNOSIS — E1151 Type 2 diabetes mellitus with diabetic peripheral angiopathy without gangrene: Secondary | ICD-10-CM | POA: Diagnosis not present

## 2019-02-25 DIAGNOSIS — I4891 Unspecified atrial fibrillation: Secondary | ICD-10-CM | POA: Diagnosis not present

## 2019-02-25 DIAGNOSIS — J690 Pneumonitis due to inhalation of food and vomit: Secondary | ICD-10-CM | POA: Diagnosis not present

## 2019-02-25 DIAGNOSIS — I69391 Dysphagia following cerebral infarction: Secondary | ICD-10-CM | POA: Diagnosis not present

## 2019-02-25 DIAGNOSIS — I5030 Unspecified diastolic (congestive) heart failure: Secondary | ICD-10-CM | POA: Diagnosis not present

## 2019-02-28 DIAGNOSIS — I4891 Unspecified atrial fibrillation: Secondary | ICD-10-CM | POA: Diagnosis not present

## 2019-02-28 DIAGNOSIS — J690 Pneumonitis due to inhalation of food and vomit: Secondary | ICD-10-CM | POA: Diagnosis not present

## 2019-02-28 DIAGNOSIS — I69391 Dysphagia following cerebral infarction: Secondary | ICD-10-CM | POA: Diagnosis not present

## 2019-02-28 DIAGNOSIS — I110 Hypertensive heart disease with heart failure: Secondary | ICD-10-CM | POA: Diagnosis not present

## 2019-02-28 DIAGNOSIS — E1151 Type 2 diabetes mellitus with diabetic peripheral angiopathy without gangrene: Secondary | ICD-10-CM | POA: Diagnosis not present

## 2019-02-28 DIAGNOSIS — I5030 Unspecified diastolic (congestive) heart failure: Secondary | ICD-10-CM | POA: Diagnosis not present

## 2019-03-02 DIAGNOSIS — I110 Hypertensive heart disease with heart failure: Secondary | ICD-10-CM | POA: Diagnosis not present

## 2019-03-02 DIAGNOSIS — J690 Pneumonitis due to inhalation of food and vomit: Secondary | ICD-10-CM | POA: Diagnosis not present

## 2019-03-02 DIAGNOSIS — I69391 Dysphagia following cerebral infarction: Secondary | ICD-10-CM | POA: Diagnosis not present

## 2019-03-02 DIAGNOSIS — I5030 Unspecified diastolic (congestive) heart failure: Secondary | ICD-10-CM | POA: Diagnosis not present

## 2019-03-02 DIAGNOSIS — I4891 Unspecified atrial fibrillation: Secondary | ICD-10-CM | POA: Diagnosis not present

## 2019-03-02 DIAGNOSIS — E1151 Type 2 diabetes mellitus with diabetic peripheral angiopathy without gangrene: Secondary | ICD-10-CM | POA: Diagnosis not present

## 2019-03-04 DIAGNOSIS — J690 Pneumonitis due to inhalation of food and vomit: Secondary | ICD-10-CM | POA: Diagnosis not present

## 2019-03-04 DIAGNOSIS — E1151 Type 2 diabetes mellitus with diabetic peripheral angiopathy without gangrene: Secondary | ICD-10-CM | POA: Diagnosis not present

## 2019-03-04 DIAGNOSIS — I4891 Unspecified atrial fibrillation: Secondary | ICD-10-CM | POA: Diagnosis not present

## 2019-03-04 DIAGNOSIS — I69391 Dysphagia following cerebral infarction: Secondary | ICD-10-CM | POA: Diagnosis not present

## 2019-03-04 DIAGNOSIS — I110 Hypertensive heart disease with heart failure: Secondary | ICD-10-CM | POA: Diagnosis not present

## 2019-03-04 DIAGNOSIS — I5030 Unspecified diastolic (congestive) heart failure: Secondary | ICD-10-CM | POA: Diagnosis not present

## 2019-03-09 DIAGNOSIS — I69391 Dysphagia following cerebral infarction: Secondary | ICD-10-CM | POA: Diagnosis not present

## 2019-03-09 DIAGNOSIS — I4891 Unspecified atrial fibrillation: Secondary | ICD-10-CM | POA: Diagnosis not present

## 2019-03-09 DIAGNOSIS — E1151 Type 2 diabetes mellitus with diabetic peripheral angiopathy without gangrene: Secondary | ICD-10-CM | POA: Diagnosis not present

## 2019-03-09 DIAGNOSIS — J690 Pneumonitis due to inhalation of food and vomit: Secondary | ICD-10-CM | POA: Diagnosis not present

## 2019-03-09 DIAGNOSIS — I5030 Unspecified diastolic (congestive) heart failure: Secondary | ICD-10-CM | POA: Diagnosis not present

## 2019-03-09 DIAGNOSIS — I110 Hypertensive heart disease with heart failure: Secondary | ICD-10-CM | POA: Diagnosis not present

## 2019-03-11 DIAGNOSIS — J690 Pneumonitis due to inhalation of food and vomit: Secondary | ICD-10-CM | POA: Diagnosis not present

## 2019-03-11 DIAGNOSIS — I4891 Unspecified atrial fibrillation: Secondary | ICD-10-CM | POA: Diagnosis not present

## 2019-03-11 DIAGNOSIS — I69391 Dysphagia following cerebral infarction: Secondary | ICD-10-CM | POA: Diagnosis not present

## 2019-03-11 DIAGNOSIS — I5030 Unspecified diastolic (congestive) heart failure: Secondary | ICD-10-CM | POA: Diagnosis not present

## 2019-03-11 DIAGNOSIS — I110 Hypertensive heart disease with heart failure: Secondary | ICD-10-CM | POA: Diagnosis not present

## 2019-03-11 DIAGNOSIS — E1151 Type 2 diabetes mellitus with diabetic peripheral angiopathy without gangrene: Secondary | ICD-10-CM | POA: Diagnosis not present

## 2019-03-16 DIAGNOSIS — I4891 Unspecified atrial fibrillation: Secondary | ICD-10-CM | POA: Diagnosis not present

## 2019-03-16 DIAGNOSIS — I5030 Unspecified diastolic (congestive) heart failure: Secondary | ICD-10-CM | POA: Diagnosis not present

## 2019-03-16 DIAGNOSIS — I110 Hypertensive heart disease with heart failure: Secondary | ICD-10-CM | POA: Diagnosis not present

## 2019-03-16 DIAGNOSIS — I69391 Dysphagia following cerebral infarction: Secondary | ICD-10-CM | POA: Diagnosis not present

## 2019-03-16 DIAGNOSIS — J690 Pneumonitis due to inhalation of food and vomit: Secondary | ICD-10-CM | POA: Diagnosis not present

## 2019-03-16 DIAGNOSIS — E1151 Type 2 diabetes mellitus with diabetic peripheral angiopathy without gangrene: Secondary | ICD-10-CM | POA: Diagnosis not present

## 2019-03-18 DIAGNOSIS — E1151 Type 2 diabetes mellitus with diabetic peripheral angiopathy without gangrene: Secondary | ICD-10-CM | POA: Diagnosis not present

## 2019-03-18 DIAGNOSIS — I4891 Unspecified atrial fibrillation: Secondary | ICD-10-CM | POA: Diagnosis not present

## 2019-03-18 DIAGNOSIS — I110 Hypertensive heart disease with heart failure: Secondary | ICD-10-CM | POA: Diagnosis not present

## 2019-03-18 DIAGNOSIS — I5030 Unspecified diastolic (congestive) heart failure: Secondary | ICD-10-CM | POA: Diagnosis not present

## 2019-03-18 DIAGNOSIS — I69391 Dysphagia following cerebral infarction: Secondary | ICD-10-CM | POA: Diagnosis not present

## 2019-03-18 DIAGNOSIS — J690 Pneumonitis due to inhalation of food and vomit: Secondary | ICD-10-CM | POA: Diagnosis not present

## 2019-03-21 DIAGNOSIS — E1151 Type 2 diabetes mellitus with diabetic peripheral angiopathy without gangrene: Secondary | ICD-10-CM | POA: Diagnosis not present

## 2019-03-21 DIAGNOSIS — I70209 Unspecified atherosclerosis of native arteries of extremities, unspecified extremity: Secondary | ICD-10-CM | POA: Diagnosis not present

## 2019-03-21 DIAGNOSIS — I69391 Dysphagia following cerebral infarction: Secondary | ICD-10-CM | POA: Diagnosis not present

## 2019-03-21 DIAGNOSIS — M109 Gout, unspecified: Secondary | ICD-10-CM | POA: Diagnosis not present

## 2019-03-21 DIAGNOSIS — N401 Enlarged prostate with lower urinary tract symptoms: Secondary | ICD-10-CM | POA: Diagnosis not present

## 2019-03-21 DIAGNOSIS — Z9981 Dependence on supplemental oxygen: Secondary | ICD-10-CM | POA: Diagnosis not present

## 2019-03-21 DIAGNOSIS — I4891 Unspecified atrial fibrillation: Secondary | ICD-10-CM | POA: Diagnosis not present

## 2019-03-21 DIAGNOSIS — R32 Unspecified urinary incontinence: Secondary | ICD-10-CM | POA: Diagnosis not present

## 2019-03-21 DIAGNOSIS — J690 Pneumonitis due to inhalation of food and vomit: Secondary | ICD-10-CM | POA: Diagnosis not present

## 2019-03-21 DIAGNOSIS — Z7401 Bed confinement status: Secondary | ICD-10-CM | POA: Diagnosis not present

## 2019-03-21 DIAGNOSIS — I5030 Unspecified diastolic (congestive) heart failure: Secondary | ICD-10-CM | POA: Diagnosis not present

## 2019-03-21 DIAGNOSIS — Z741 Need for assistance with personal care: Secondary | ICD-10-CM | POA: Diagnosis not present

## 2019-03-21 DIAGNOSIS — L89620 Pressure ulcer of left heel, unstageable: Secondary | ICD-10-CM | POA: Diagnosis not present

## 2019-03-21 DIAGNOSIS — I110 Hypertensive heart disease with heart failure: Secondary | ICD-10-CM | POA: Diagnosis not present

## 2019-03-23 DIAGNOSIS — I69391 Dysphagia following cerebral infarction: Secondary | ICD-10-CM | POA: Diagnosis not present

## 2019-03-23 DIAGNOSIS — E1151 Type 2 diabetes mellitus with diabetic peripheral angiopathy without gangrene: Secondary | ICD-10-CM | POA: Diagnosis not present

## 2019-03-23 DIAGNOSIS — I5030 Unspecified diastolic (congestive) heart failure: Secondary | ICD-10-CM | POA: Diagnosis not present

## 2019-03-23 DIAGNOSIS — J690 Pneumonitis due to inhalation of food and vomit: Secondary | ICD-10-CM | POA: Diagnosis not present

## 2019-03-23 DIAGNOSIS — I110 Hypertensive heart disease with heart failure: Secondary | ICD-10-CM | POA: Diagnosis not present

## 2019-03-23 DIAGNOSIS — I4891 Unspecified atrial fibrillation: Secondary | ICD-10-CM | POA: Diagnosis not present

## 2019-03-24 DIAGNOSIS — I4891 Unspecified atrial fibrillation: Secondary | ICD-10-CM | POA: Diagnosis not present

## 2019-03-24 DIAGNOSIS — E1151 Type 2 diabetes mellitus with diabetic peripheral angiopathy without gangrene: Secondary | ICD-10-CM | POA: Diagnosis not present

## 2019-03-24 DIAGNOSIS — I69391 Dysphagia following cerebral infarction: Secondary | ICD-10-CM | POA: Diagnosis not present

## 2019-03-24 DIAGNOSIS — I110 Hypertensive heart disease with heart failure: Secondary | ICD-10-CM | POA: Diagnosis not present

## 2019-03-24 DIAGNOSIS — J690 Pneumonitis due to inhalation of food and vomit: Secondary | ICD-10-CM | POA: Diagnosis not present

## 2019-03-24 DIAGNOSIS — I5030 Unspecified diastolic (congestive) heart failure: Secondary | ICD-10-CM | POA: Diagnosis not present

## 2019-03-25 DIAGNOSIS — I4891 Unspecified atrial fibrillation: Secondary | ICD-10-CM | POA: Diagnosis not present

## 2019-03-25 DIAGNOSIS — I110 Hypertensive heart disease with heart failure: Secondary | ICD-10-CM | POA: Diagnosis not present

## 2019-03-25 DIAGNOSIS — J690 Pneumonitis due to inhalation of food and vomit: Secondary | ICD-10-CM | POA: Diagnosis not present

## 2019-03-25 DIAGNOSIS — I5030 Unspecified diastolic (congestive) heart failure: Secondary | ICD-10-CM | POA: Diagnosis not present

## 2019-03-25 DIAGNOSIS — I69391 Dysphagia following cerebral infarction: Secondary | ICD-10-CM | POA: Diagnosis not present

## 2019-03-25 DIAGNOSIS — E1151 Type 2 diabetes mellitus with diabetic peripheral angiopathy without gangrene: Secondary | ICD-10-CM | POA: Diagnosis not present

## 2019-03-28 DIAGNOSIS — E1151 Type 2 diabetes mellitus with diabetic peripheral angiopathy without gangrene: Secondary | ICD-10-CM | POA: Diagnosis not present

## 2019-03-28 DIAGNOSIS — I69391 Dysphagia following cerebral infarction: Secondary | ICD-10-CM | POA: Diagnosis not present

## 2019-03-28 DIAGNOSIS — J690 Pneumonitis due to inhalation of food and vomit: Secondary | ICD-10-CM | POA: Diagnosis not present

## 2019-03-28 DIAGNOSIS — I110 Hypertensive heart disease with heart failure: Secondary | ICD-10-CM | POA: Diagnosis not present

## 2019-03-28 DIAGNOSIS — I4891 Unspecified atrial fibrillation: Secondary | ICD-10-CM | POA: Diagnosis not present

## 2019-03-28 DIAGNOSIS — I5030 Unspecified diastolic (congestive) heart failure: Secondary | ICD-10-CM | POA: Diagnosis not present

## 2019-03-30 DIAGNOSIS — I69391 Dysphagia following cerebral infarction: Secondary | ICD-10-CM | POA: Diagnosis not present

## 2019-03-30 DIAGNOSIS — J690 Pneumonitis due to inhalation of food and vomit: Secondary | ICD-10-CM | POA: Diagnosis not present

## 2019-03-30 DIAGNOSIS — I5030 Unspecified diastolic (congestive) heart failure: Secondary | ICD-10-CM | POA: Diagnosis not present

## 2019-03-30 DIAGNOSIS — I110 Hypertensive heart disease with heart failure: Secondary | ICD-10-CM | POA: Diagnosis not present

## 2019-03-30 DIAGNOSIS — E1151 Type 2 diabetes mellitus with diabetic peripheral angiopathy without gangrene: Secondary | ICD-10-CM | POA: Diagnosis not present

## 2019-03-30 DIAGNOSIS — I4891 Unspecified atrial fibrillation: Secondary | ICD-10-CM | POA: Diagnosis not present

## 2019-04-01 DIAGNOSIS — I69391 Dysphagia following cerebral infarction: Secondary | ICD-10-CM | POA: Diagnosis not present

## 2019-04-01 DIAGNOSIS — I5030 Unspecified diastolic (congestive) heart failure: Secondary | ICD-10-CM | POA: Diagnosis not present

## 2019-04-01 DIAGNOSIS — I110 Hypertensive heart disease with heart failure: Secondary | ICD-10-CM | POA: Diagnosis not present

## 2019-04-01 DIAGNOSIS — J690 Pneumonitis due to inhalation of food and vomit: Secondary | ICD-10-CM | POA: Diagnosis not present

## 2019-04-01 DIAGNOSIS — E1151 Type 2 diabetes mellitus with diabetic peripheral angiopathy without gangrene: Secondary | ICD-10-CM | POA: Diagnosis not present

## 2019-04-01 DIAGNOSIS — I4891 Unspecified atrial fibrillation: Secondary | ICD-10-CM | POA: Diagnosis not present

## 2019-04-03 DIAGNOSIS — I69391 Dysphagia following cerebral infarction: Secondary | ICD-10-CM | POA: Diagnosis not present

## 2019-04-03 DIAGNOSIS — E1151 Type 2 diabetes mellitus with diabetic peripheral angiopathy without gangrene: Secondary | ICD-10-CM | POA: Diagnosis not present

## 2019-04-03 DIAGNOSIS — I4891 Unspecified atrial fibrillation: Secondary | ICD-10-CM | POA: Diagnosis not present

## 2019-04-03 DIAGNOSIS — J690 Pneumonitis due to inhalation of food and vomit: Secondary | ICD-10-CM | POA: Diagnosis not present

## 2019-04-03 DIAGNOSIS — I5030 Unspecified diastolic (congestive) heart failure: Secondary | ICD-10-CM | POA: Diagnosis not present

## 2019-04-03 DIAGNOSIS — I110 Hypertensive heart disease with heart failure: Secondary | ICD-10-CM | POA: Diagnosis not present

## 2019-04-18 DEATH — deceased
